# Patient Record
Sex: Female | Born: 1935 | Race: White | Hispanic: No | Smoking: Former smoker
Health system: Southern US, Community
[De-identification: ages and names within clinical notes are randomized; demographics above are authoritative.]

## PROBLEM LIST (undated history)

## (undated) DIAGNOSIS — I2699 Other pulmonary embolism without acute cor pulmonale: Secondary | ICD-10-CM

## (undated) DIAGNOSIS — K90829 Short bowel syndrome, unspecified: Secondary | ICD-10-CM

## (undated) DIAGNOSIS — M1990 Unspecified osteoarthritis, unspecified site: Secondary | ICD-10-CM

## (undated) DIAGNOSIS — K912 Postsurgical malabsorption, not elsewhere classified: Secondary | ICD-10-CM

## (undated) DIAGNOSIS — Z955 Presence of coronary angioplasty implant and graft: Secondary | ICD-10-CM

## (undated) DIAGNOSIS — S72009A Fracture of unspecified part of neck of unspecified femur, initial encounter for closed fracture: Secondary | ICD-10-CM

## (undated) DIAGNOSIS — C801 Malignant (primary) neoplasm, unspecified: Secondary | ICD-10-CM

## (undated) DIAGNOSIS — I469 Cardiac arrest, cause unspecified: Secondary | ICD-10-CM

## (undated) DIAGNOSIS — E876 Hypokalemia: Secondary | ICD-10-CM

## (undated) DIAGNOSIS — I82409 Acute embolism and thrombosis of unspecified deep veins of unspecified lower extremity: Secondary | ICD-10-CM

## (undated) HISTORY — DX: Hypokalemia: E87.6

## (undated) HISTORY — DX: Cardiac arrest, cause unspecified: I46.9

## (undated) HISTORY — DX: Acute embolism and thrombosis of unspecified deep veins of unspecified lower extremity: I82.409

## (undated) HISTORY — DX: Malignant (primary) neoplasm, unspecified: C80.1

## (undated) HISTORY — DX: Fracture of unspecified part of neck of unspecified femur, initial encounter for closed fracture: S72.009A

## (undated) HISTORY — DX: Presence of coronary angioplasty implant and graft: Z95.5

## (undated) HISTORY — DX: Unspecified osteoarthritis, unspecified site: M19.90

## (undated) HISTORY — DX: Other pulmonary embolism without acute cor pulmonale: I26.99

## (undated) HISTORY — DX: Short bowel syndrome, unspecified: K90.829

## (undated) HISTORY — DX: Postsurgical malabsorption, not elsewhere classified: K91.2

---

## 1997-04-03 HISTORY — PX: COLON SURGERY: SHX602

## 1997-04-03 HISTORY — PX: CATARACT EXTRACTION, BILATERAL: SHX1313

## 1997-04-03 HISTORY — PX: CHOLECYSTECTOMY: SHX55

## 2006-08-28 ENCOUNTER — Encounter: Payer: Self-pay | Admitting: Family Medicine

## 2007-05-09 ENCOUNTER — Ambulatory Visit: Payer: Self-pay | Admitting: Family Medicine

## 2007-05-09 DIAGNOSIS — M810 Age-related osteoporosis without current pathological fracture: Secondary | ICD-10-CM | POA: Insufficient documentation

## 2007-05-09 DIAGNOSIS — N183 Chronic kidney disease, stage 3 (moderate): Secondary | ICD-10-CM

## 2007-05-09 DIAGNOSIS — Z86718 Personal history of other venous thrombosis and embolism: Secondary | ICD-10-CM | POA: Insufficient documentation

## 2007-05-09 LAB — CONVERTED CEMR LAB: Prothrombin Time: 19.3 s

## 2007-05-13 ENCOUNTER — Encounter: Payer: Self-pay | Admitting: Family Medicine

## 2007-05-14 LAB — CONVERTED CEMR LAB
AST: 17 units/L (ref 0–37)
Albumin: 4.4 g/dL (ref 3.5–5.2)
Alkaline Phosphatase: 74 units/L (ref 39–117)
BUN: 24 mg/dL — ABNORMAL HIGH (ref 6–23)
Potassium: 4.6 meq/L (ref 3.5–5.3)
Sodium: 141 meq/L (ref 135–145)
Total Bilirubin: 0.5 mg/dL (ref 0.3–1.2)

## 2007-05-24 ENCOUNTER — Encounter: Payer: Self-pay | Admitting: Family Medicine

## 2007-05-24 DIAGNOSIS — E785 Hyperlipidemia, unspecified: Secondary | ICD-10-CM

## 2007-06-04 ENCOUNTER — Encounter: Payer: Self-pay | Admitting: Family Medicine

## 2007-06-05 ENCOUNTER — Encounter: Payer: Self-pay | Admitting: Family Medicine

## 2007-06-06 ENCOUNTER — Ambulatory Visit: Payer: Self-pay | Admitting: Family Medicine

## 2007-06-06 LAB — CONVERTED CEMR LAB: INR: 1.4

## 2007-06-13 ENCOUNTER — Ambulatory Visit: Payer: Self-pay | Admitting: Family Medicine

## 2007-06-13 LAB — CONVERTED CEMR LAB: Prothrombin Time: 16.4 s

## 2007-06-17 ENCOUNTER — Telehealth: Payer: Self-pay | Admitting: Family Medicine

## 2007-07-02 ENCOUNTER — Telehealth: Payer: Self-pay | Admitting: Family Medicine

## 2007-07-08 ENCOUNTER — Encounter: Payer: Self-pay | Admitting: Family Medicine

## 2007-07-11 ENCOUNTER — Encounter: Payer: Self-pay | Admitting: Family Medicine

## 2007-07-15 ENCOUNTER — Telehealth: Payer: Self-pay | Admitting: Family Medicine

## 2007-07-15 ENCOUNTER — Encounter: Payer: Self-pay | Admitting: Family Medicine

## 2007-07-15 LAB — CONVERTED CEMR LAB
INR: 1.5
Prothrombin Time: 14.7 s

## 2007-07-18 ENCOUNTER — Ambulatory Visit: Payer: Self-pay | Admitting: Family Medicine

## 2007-07-18 DIAGNOSIS — D510 Vitamin B12 deficiency anemia due to intrinsic factor deficiency: Secondary | ICD-10-CM

## 2007-07-18 DIAGNOSIS — S72009A Fracture of unspecified part of neck of unspecified femur, initial encounter for closed fracture: Secondary | ICD-10-CM | POA: Insufficient documentation

## 2007-07-18 DIAGNOSIS — D509 Iron deficiency anemia, unspecified: Secondary | ICD-10-CM

## 2007-07-18 LAB — CONVERTED CEMR LAB: Prothrombin Time: 13.1 s

## 2007-07-23 ENCOUNTER — Telehealth: Payer: Self-pay | Admitting: Family Medicine

## 2007-07-26 ENCOUNTER — Telehealth (INDEPENDENT_AMBULATORY_CARE_PROVIDER_SITE_OTHER): Payer: Self-pay | Admitting: Family Medicine

## 2007-07-26 ENCOUNTER — Encounter: Payer: Self-pay | Admitting: Family Medicine

## 2007-08-01 ENCOUNTER — Encounter: Payer: Self-pay | Admitting: Family Medicine

## 2007-08-01 ENCOUNTER — Telehealth: Payer: Self-pay | Admitting: Family Medicine

## 2007-08-01 LAB — CONVERTED CEMR LAB: Prothrombin Time: 17.8 s

## 2007-08-08 ENCOUNTER — Encounter: Payer: Self-pay | Admitting: Family Medicine

## 2007-08-09 ENCOUNTER — Encounter: Payer: Self-pay | Admitting: Family Medicine

## 2007-08-09 ENCOUNTER — Telehealth: Payer: Self-pay | Admitting: Family Medicine

## 2007-08-09 LAB — CONVERTED CEMR LAB: Prothrombin Time: 11.9 s

## 2007-08-22 ENCOUNTER — Telehealth: Payer: Self-pay | Admitting: Family Medicine

## 2007-08-22 ENCOUNTER — Encounter: Payer: Self-pay | Admitting: Family Medicine

## 2007-08-22 LAB — CONVERTED CEMR LAB
INR: 1.5
Prothrombin Time: 15.2 s

## 2007-09-06 ENCOUNTER — Telehealth (INDEPENDENT_AMBULATORY_CARE_PROVIDER_SITE_OTHER): Payer: Self-pay | Admitting: *Deleted

## 2007-09-06 ENCOUNTER — Encounter: Payer: Self-pay | Admitting: Family Medicine

## 2007-09-06 ENCOUNTER — Encounter: Payer: Self-pay | Admitting: *Deleted

## 2007-09-06 LAB — CONVERTED CEMR LAB
INR: 1.9
Prothrombin Time: 19.2 s

## 2007-09-09 ENCOUNTER — Encounter: Payer: Self-pay | Admitting: Family Medicine

## 2007-09-10 ENCOUNTER — Encounter: Payer: Self-pay | Admitting: Family Medicine

## 2007-09-11 ENCOUNTER — Encounter: Payer: Self-pay | Admitting: Family Medicine

## 2007-09-11 DIAGNOSIS — H269 Unspecified cataract: Secondary | ICD-10-CM

## 2007-09-13 ENCOUNTER — Encounter: Payer: Self-pay | Admitting: Family Medicine

## 2007-09-13 ENCOUNTER — Telehealth: Payer: Self-pay | Admitting: Family Medicine

## 2007-09-13 LAB — CONVERTED CEMR LAB: INR: 1.6

## 2007-09-23 ENCOUNTER — Telehealth: Payer: Self-pay | Admitting: Family Medicine

## 2007-09-23 ENCOUNTER — Encounter: Payer: Self-pay | Admitting: Family Medicine

## 2007-09-23 LAB — CONVERTED CEMR LAB: Prothrombin Time: 20.8 s

## 2007-09-27 ENCOUNTER — Telehealth: Payer: Self-pay | Admitting: Family Medicine

## 2007-09-27 ENCOUNTER — Encounter: Payer: Self-pay | Admitting: Family Medicine

## 2007-10-02 ENCOUNTER — Telehealth: Payer: Self-pay | Admitting: Family Medicine

## 2007-10-02 ENCOUNTER — Encounter: Payer: Self-pay | Admitting: Family Medicine

## 2007-10-02 LAB — CONVERTED CEMR LAB
INR: 2.1
Prothrombin Time: 20.8 s

## 2007-10-16 ENCOUNTER — Encounter: Payer: Self-pay | Admitting: Family Medicine

## 2007-11-05 ENCOUNTER — Encounter: Payer: Self-pay | Admitting: Family Medicine

## 2007-11-06 ENCOUNTER — Encounter: Payer: Self-pay | Admitting: Family Medicine

## 2007-11-06 ENCOUNTER — Ambulatory Visit: Payer: Self-pay | Admitting: Family Medicine

## 2007-11-06 ENCOUNTER — Other Ambulatory Visit: Admission: RE | Admit: 2007-11-06 | Discharge: 2007-11-06 | Payer: Self-pay | Admitting: Family Medicine

## 2007-11-06 DIAGNOSIS — R3129 Other microscopic hematuria: Secondary | ICD-10-CM

## 2007-11-06 DIAGNOSIS — Z85038 Personal history of other malignant neoplasm of large intestine: Secondary | ICD-10-CM | POA: Insufficient documentation

## 2007-11-06 LAB — CONVERTED CEMR LAB
INR: 2.3
Prothrombin Time: 18.6 s

## 2007-11-07 ENCOUNTER — Encounter: Payer: Self-pay | Admitting: Family Medicine

## 2007-11-07 LAB — CONVERTED CEMR LAB
ALT: 20 units/L (ref 0–35)
AST: 19 units/L (ref 0–37)
Albumin: 4.4 g/dL (ref 3.5–5.2)
Alkaline Phosphatase: 88 units/L (ref 39–117)
Glucose, Bld: 94 mg/dL (ref 70–99)
LDL Cholesterol: 75 mg/dL (ref 0–99)
Potassium: 3.8 meq/L (ref 3.5–5.3)
Sodium: 142 meq/L (ref 135–145)
Total Bilirubin: 0.6 mg/dL (ref 0.3–1.2)
Total Protein: 7.1 g/dL (ref 6.0–8.3)
Triglycerides: 236 mg/dL — ABNORMAL HIGH (ref <150)
VLDL: 47 mg/dL — ABNORMAL HIGH (ref 0–40)

## 2007-11-11 LAB — CONVERTED CEMR LAB: Pap Smear: NORMAL

## 2007-11-15 ENCOUNTER — Encounter: Payer: Self-pay | Admitting: Family Medicine

## 2007-11-16 ENCOUNTER — Encounter: Payer: Self-pay | Admitting: Family Medicine

## 2007-11-18 LAB — CONVERTED CEMR LAB
Microalb Creat Ratio: 9.7 mg/g (ref 0.0–30.0)
Microalb, Ur: 2.34 mg/dL — ABNORMAL HIGH (ref 0.00–1.89)
PTH: 44 pg/mL (ref 14.0–72.0)

## 2007-11-27 ENCOUNTER — Encounter: Payer: Self-pay | Admitting: Family Medicine

## 2007-12-03 ENCOUNTER — Encounter: Payer: Self-pay | Admitting: Family Medicine

## 2007-12-04 ENCOUNTER — Ambulatory Visit: Payer: Self-pay | Admitting: Family Medicine

## 2007-12-04 LAB — CONVERTED CEMR LAB: Prothrombin Time: 18.4 s

## 2007-12-05 ENCOUNTER — Telehealth (INDEPENDENT_AMBULATORY_CARE_PROVIDER_SITE_OTHER): Payer: Self-pay | Admitting: *Deleted

## 2007-12-06 ENCOUNTER — Encounter: Payer: Self-pay | Admitting: Family Medicine

## 2007-12-26 ENCOUNTER — Ambulatory Visit: Payer: Self-pay | Admitting: Family Medicine

## 2007-12-26 LAB — CONVERTED CEMR LAB: Prothrombin Time: 20.8 s

## 2008-01-13 ENCOUNTER — Telehealth: Payer: Self-pay | Admitting: Family Medicine

## 2008-02-03 ENCOUNTER — Ambulatory Visit: Payer: Self-pay | Admitting: Family Medicine

## 2008-02-03 DIAGNOSIS — M25519 Pain in unspecified shoulder: Secondary | ICD-10-CM

## 2008-02-03 DIAGNOSIS — M549 Dorsalgia, unspecified: Secondary | ICD-10-CM | POA: Insufficient documentation

## 2008-02-03 LAB — CONVERTED CEMR LAB
INR: 3
Prothrombin Time: 21 s

## 2008-02-12 LAB — HM COLONOSCOPY: HM Colonoscopy: NORMAL

## 2008-02-25 ENCOUNTER — Encounter: Payer: Self-pay | Admitting: Family Medicine

## 2008-03-04 ENCOUNTER — Ambulatory Visit: Payer: Self-pay | Admitting: Family Medicine

## 2008-03-04 LAB — CONVERTED CEMR LAB: INR: 2.5

## 2008-03-31 ENCOUNTER — Ambulatory Visit: Payer: Self-pay | Admitting: Family Medicine

## 2008-04-28 ENCOUNTER — Ambulatory Visit: Payer: Self-pay | Admitting: Family Medicine

## 2008-04-28 LAB — CONVERTED CEMR LAB: INR: 3

## 2008-05-27 ENCOUNTER — Encounter: Payer: Self-pay | Admitting: Family Medicine

## 2008-06-01 ENCOUNTER — Telehealth: Payer: Self-pay | Admitting: Family Medicine

## 2008-06-12 ENCOUNTER — Ambulatory Visit: Payer: Self-pay | Admitting: Family Medicine

## 2008-06-12 LAB — CONVERTED CEMR LAB: Prothrombin Time: 17.5 s

## 2008-07-06 ENCOUNTER — Ambulatory Visit: Payer: Self-pay | Admitting: Family Medicine

## 2008-07-06 DIAGNOSIS — R609 Edema, unspecified: Secondary | ICD-10-CM

## 2008-07-06 DIAGNOSIS — E559 Vitamin D deficiency, unspecified: Secondary | ICD-10-CM | POA: Insufficient documentation

## 2008-07-06 LAB — CONVERTED CEMR LAB
INR: 3
Prothrombin Time: 20.9 s

## 2008-07-20 ENCOUNTER — Ambulatory Visit: Payer: Self-pay | Admitting: Family Medicine

## 2008-07-20 LAB — CONVERTED CEMR LAB: INR: 3.2

## 2008-08-03 ENCOUNTER — Ambulatory Visit: Payer: Self-pay | Admitting: Family Medicine

## 2008-09-23 ENCOUNTER — Telehealth (INDEPENDENT_AMBULATORY_CARE_PROVIDER_SITE_OTHER): Payer: Self-pay | Admitting: *Deleted

## 2008-09-23 ENCOUNTER — Ambulatory Visit: Payer: Self-pay | Admitting: Family Medicine

## 2008-09-23 LAB — CONVERTED CEMR LAB
INR: 5.3 (ref 0.0–1.5)
Prothrombin Time: 33.6 s — ABNORMAL HIGH (ref 10.2–14.0)

## 2008-09-28 ENCOUNTER — Ambulatory Visit: Payer: Self-pay | Admitting: Family Medicine

## 2008-09-28 LAB — CONVERTED CEMR LAB: INR: 3.2

## 2008-10-08 ENCOUNTER — Ambulatory Visit: Payer: Self-pay | Admitting: Family Medicine

## 2008-10-08 LAB — CONVERTED CEMR LAB
INR: 3.1
Prothrombin Time: 21.2 s

## 2008-10-22 ENCOUNTER — Ambulatory Visit: Payer: Self-pay | Admitting: Family Medicine

## 2008-10-22 ENCOUNTER — Telehealth (INDEPENDENT_AMBULATORY_CARE_PROVIDER_SITE_OTHER): Payer: Self-pay | Admitting: *Deleted

## 2008-10-29 ENCOUNTER — Ambulatory Visit: Payer: Self-pay | Admitting: Family Medicine

## 2008-10-29 ENCOUNTER — Encounter: Admission: RE | Admit: 2008-10-29 | Discharge: 2008-10-29 | Payer: Self-pay | Admitting: Family Medicine

## 2008-10-29 DIAGNOSIS — M79609 Pain in unspecified limb: Secondary | ICD-10-CM

## 2008-11-02 ENCOUNTER — Encounter: Payer: Self-pay | Admitting: Family Medicine

## 2008-11-02 LAB — CONVERTED CEMR LAB
ALT: 12 units/L (ref 0–35)
BUN: 23 mg/dL (ref 6–23)
Basophils Relative: 0 % (ref 0–1)
CO2: 24 meq/L (ref 19–32)
Calcium: 8 mg/dL — ABNORMAL LOW (ref 8.4–10.5)
Chloride: 105 meq/L (ref 96–112)
Creatinine, Ser: 1.32 mg/dL — ABNORMAL HIGH (ref 0.40–1.20)
Eosinophils Absolute: 0.1 10*3/uL (ref 0.0–0.7)
Eosinophils Relative: 2 % (ref 0–5)
Glucose, Bld: 97 mg/dL (ref 70–99)
HCT: 33.4 % — ABNORMAL LOW (ref 36.0–46.0)
Hemoglobin: 10.6 g/dL — ABNORMAL LOW (ref 12.0–15.0)
Iron: 61 ug/dL (ref 42–145)
MCHC: 31.7 g/dL (ref 30.0–36.0)
MCV: 89.5 fL (ref 78.0–100.0)
Monocytes Absolute: 0.4 10*3/uL (ref 0.1–1.0)
Monocytes Relative: 7 % (ref 3–12)
RBC: 3.73 M/uL — ABNORMAL LOW (ref 3.87–5.11)
Sed Rate: 28 mm/hr — ABNORMAL HIGH (ref 0–22)
TSH: 0.638 microintl units/mL (ref 0.350–4.500)

## 2008-11-12 ENCOUNTER — Ambulatory Visit: Payer: Self-pay | Admitting: Family Medicine

## 2008-12-03 ENCOUNTER — Ambulatory Visit: Payer: Self-pay | Admitting: Family Medicine

## 2008-12-03 LAB — CONVERTED CEMR LAB: INR: 2.1

## 2008-12-29 ENCOUNTER — Ambulatory Visit: Payer: Self-pay | Admitting: Family Medicine

## 2008-12-29 ENCOUNTER — Encounter: Admission: RE | Admit: 2008-12-29 | Discharge: 2008-12-29 | Payer: Self-pay | Admitting: Family Medicine

## 2008-12-29 DIAGNOSIS — M25579 Pain in unspecified ankle and joints of unspecified foot: Secondary | ICD-10-CM

## 2008-12-29 LAB — CONVERTED CEMR LAB: Prothrombin Time: 12.1 s

## 2008-12-30 ENCOUNTER — Encounter: Payer: Self-pay | Admitting: Family Medicine

## 2008-12-30 LAB — CONVERTED CEMR LAB
INR: 1.5 (ref 0.0–1.5)
PTH: 70.4 pg/mL (ref 14.0–72.0)
Prothrombin Time: 17.5 s — ABNORMAL HIGH (ref 11.6–15.2)

## 2009-02-04 ENCOUNTER — Ambulatory Visit: Payer: Self-pay | Admitting: Family Medicine

## 2009-03-03 ENCOUNTER — Ambulatory Visit: Payer: Self-pay | Admitting: Family Medicine

## 2009-03-03 LAB — CONVERTED CEMR LAB
INR: 4
Prothrombin Time: 24.2 s

## 2009-03-17 ENCOUNTER — Ambulatory Visit: Payer: Self-pay | Admitting: Family Medicine

## 2009-03-17 LAB — CONVERTED CEMR LAB: Prothrombin Time: 16.4 s

## 2009-03-31 ENCOUNTER — Ambulatory Visit: Payer: Self-pay | Admitting: Family Medicine

## 2009-03-31 LAB — CONVERTED CEMR LAB: Prothrombin Time: 17.8 s

## 2009-04-01 ENCOUNTER — Encounter: Payer: Self-pay | Admitting: Family Medicine

## 2009-04-22 ENCOUNTER — Ambulatory Visit: Payer: Self-pay | Admitting: Family Medicine

## 2009-04-23 LAB — CONVERTED CEMR LAB: Uric Acid, Serum: 4.9 mg/dL (ref 2.4–7.0)

## 2009-04-27 ENCOUNTER — Telehealth: Payer: Self-pay | Admitting: Family Medicine

## 2009-04-27 ENCOUNTER — Encounter: Payer: Self-pay | Admitting: Family Medicine

## 2009-06-16 ENCOUNTER — Ambulatory Visit: Payer: Self-pay | Admitting: Family Medicine

## 2009-06-16 DIAGNOSIS — R197 Diarrhea, unspecified: Secondary | ICD-10-CM

## 2009-06-16 LAB — CONVERTED CEMR LAB
INR: 2.5
Prothrombin Time: 19.3 s

## 2009-06-17 ENCOUNTER — Telehealth: Payer: Self-pay | Admitting: Family Medicine

## 2009-06-21 LAB — CONVERTED CEMR LAB
BUN: 20 mg/dL (ref 6–23)
Creatinine, Ser: 1.63 mg/dL — ABNORMAL HIGH (ref 0.40–1.20)

## 2009-06-22 ENCOUNTER — Encounter: Payer: Self-pay | Admitting: Family Medicine

## 2009-06-23 ENCOUNTER — Encounter: Payer: Self-pay | Admitting: Family Medicine

## 2009-06-23 LAB — CONVERTED CEMR LAB
Lipase: 78 units/L — ABNORMAL HIGH (ref 0–75)
Magnesium: 0.5 mg/dL — CL (ref 1.5–2.5)
PTH: 35.2 pg/mL (ref 14.0–72.0)

## 2009-07-14 ENCOUNTER — Ambulatory Visit: Payer: Self-pay | Admitting: Family Medicine

## 2009-07-27 ENCOUNTER — Telehealth: Payer: Self-pay | Admitting: Family Medicine

## 2009-07-27 DIAGNOSIS — E876 Hypokalemia: Secondary | ICD-10-CM

## 2009-07-28 ENCOUNTER — Ambulatory Visit: Payer: Self-pay | Admitting: Family Medicine

## 2009-07-28 LAB — CONVERTED CEMR LAB
INR: 1.1
Prothrombin Time: 12.7 s

## 2009-07-29 LAB — CONVERTED CEMR LAB
Calcium Ionized: 1.11 mmol/L — ABNORMAL LOW (ref 1.12–1.32)
Magnesium: 0.6 mg/dL — ABNORMAL LOW (ref 1.5–2.5)
Potassium: 3.4 meq/L — ABNORMAL LOW (ref 3.5–5.3)

## 2009-08-09 ENCOUNTER — Ambulatory Visit: Payer: Self-pay | Admitting: Family Medicine

## 2009-08-31 ENCOUNTER — Encounter: Payer: Self-pay | Admitting: Family Medicine

## 2009-09-06 ENCOUNTER — Ambulatory Visit: Payer: Self-pay | Admitting: Family Medicine

## 2009-09-28 ENCOUNTER — Encounter: Payer: Self-pay | Admitting: Family Medicine

## 2009-10-11 ENCOUNTER — Ambulatory Visit: Payer: Self-pay | Admitting: Family Medicine

## 2009-11-11 ENCOUNTER — Ambulatory Visit: Payer: Self-pay | Admitting: Family Medicine

## 2009-12-21 ENCOUNTER — Encounter: Payer: Self-pay | Admitting: Family Medicine

## 2009-12-23 ENCOUNTER — Ambulatory Visit: Payer: Self-pay | Admitting: Family Medicine

## 2009-12-31 ENCOUNTER — Ambulatory Visit: Payer: Self-pay | Admitting: Family Medicine

## 2009-12-31 LAB — CONVERTED CEMR LAB: INR: 2.7

## 2010-02-01 ENCOUNTER — Encounter: Payer: Self-pay | Admitting: Family Medicine

## 2010-02-04 ENCOUNTER — Ambulatory Visit: Payer: Self-pay | Admitting: Family Medicine

## 2010-02-04 LAB — CONVERTED CEMR LAB: INR: 2.5

## 2010-03-01 ENCOUNTER — Encounter: Payer: Self-pay | Admitting: Family Medicine

## 2010-03-04 ENCOUNTER — Ambulatory Visit: Payer: Self-pay | Admitting: Family Medicine

## 2010-03-04 LAB — CONVERTED CEMR LAB: INR: 2.8

## 2010-03-29 ENCOUNTER — Encounter: Payer: Self-pay | Admitting: Family Medicine

## 2010-04-15 ENCOUNTER — Ambulatory Visit
Admission: RE | Admit: 2010-04-15 | Discharge: 2010-04-15 | Payer: Self-pay | Source: Home / Self Care | Attending: Family Medicine | Admitting: Family Medicine

## 2010-04-22 ENCOUNTER — Ambulatory Visit
Admission: RE | Admit: 2010-04-22 | Discharge: 2010-04-22 | Payer: Self-pay | Source: Home / Self Care | Attending: Family Medicine | Admitting: Family Medicine

## 2010-04-25 ENCOUNTER — Telehealth: Payer: Self-pay | Admitting: Family Medicine

## 2010-05-03 NOTE — Consult Note (Signed)
Summary: [facility]   Imported By: Lanelle Bal 05/01/2009 11:39:12  _____________________________________________________________________  External Attachment:    Type:   Image     Comment:   External Document

## 2010-05-03 NOTE — Miscellaneous (Signed)
  Clinical Lists Changes  Medications: Changed medication from KLOR-CON 10 10 MEQ  TBCR (POTASSIUM CHLORIDE) Take one tablet by mouth once ad ay to KLOR-CON 10 10 MEQ  TBCR (POTASSIUM CHLORIDE) Take one tablet by mouth twice a day - Signed Rx of KLOR-CON 10 10 MEQ  TBCR (POTASSIUM CHLORIDE) Take one tablet by mouth twice a day;  #180 x 2;  Signed;  Entered by: Kathlene November;  Authorized by: Nani Gasser MD;  Method used: Electronically to [facility] [address], [place], [place]  [address], Ph: [phone], Fax: [phone]    Prescriptions: KLOR-CON 10 10 MEQ  TBCR (POTASSIUM CHLORIDE) Take one tablet by mouth twice a day  #180 x 2   Entered by:   Kathlene November   Authorized by:   Nani Gasser MD   Signed by:   Kathlene November on 07/28/2009   Method used:   Electronically to        [facility] (retail)       [address]       [place], [place]  [address]       Ph: [phone]       Fax: [phone]   RxID:   [id number]

## 2010-05-03 NOTE — Progress Notes (Signed)
Summary: Over due for labs     New Problems: HYPOKALEMIA (ICD-276.8) HYPOMAGNESEMIA (ICD-275.2)   New Problems: HYPOKALEMIA (ICD-276.8) HYPOMAGNESEMIA (ICD-275.2) Pt vcalled. Overdue for labs. Pt says she will go tomorrow. April 26, 20112:51 PM Gabrielle Juul MD, Santina Evans

## 2010-05-03 NOTE — Assessment & Plan Note (Signed)
Summary: Coumadin check   Nurse Visit   Anticoagulation Management History:      The patient is on coumadin and comes in today for a routine follow up visit.  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  Plans are to keep the patient on coumadin for life.  Her last INR was 2.5 and today's INR is 2.8.     Allergies: 1)  ! Bactrim 2)  ! Cipro 3)  ! Zithromax 4)  ! Macrobid 5)  ! Fosamax 6)  Tramadol Hcl (Tramadol Hcl) Laboratory Results   Blood Tests   Date/Time Received: 03/04/2010 Date/Time Reported: 03/04/2010   INR: 2.8   (Normal Range: 0.88-1.12   Therap INR: 2.0-3.5)    Orders Added: 1)  Fingerstick [36416] 2)  Protime [16109UE]   Anticoagulation Management Assessment/Plan:      The target INR is 2.0-3.0.  She is to have a PT/INR in 6 weeks.  Anticoagulation instructions were given to patient.         Current Anticoagulation Instructions: The patient is to continue with the same dose of coumadin.  This dosage includes: Coumadin 4 mg tabs:  Sunday - 4 mg, Monday - 4 mg, Tuesday - 4 mg, Wednesday - 4 mg, Thursday - 4 mg, Friday - 4 mg, Saturday - 4 mg.  Repeat PT/INR in 6 weeks.

## 2010-05-03 NOTE — Assessment & Plan Note (Signed)
Summary: NURSE INR  Nurse Visit   Allergies: 1)  ! Bactrim 2)  ! Cipro 3)  ! Zithromax 4)  ! Macrobid 5)  ! Fosamax 6)  Tramadol Hcl (Tramadol Hcl) Laboratory Results   Blood Tests   Date/Time Received: 08/09/2009 Date/Time Reported: 08/09/2009  PT: 19.2 s   (Normal Range: 10.6-13.4)  INR: 2.5   (Normal Range: 0.88-1.12   Therap INR: 2.0-3.5)    Orders Added: 1)  Fingerstick [36416] 2)  Protime INR [[id number]]   Anticoagulation Management History:      The patient is on coumadin and comes in today for a routine follow up visit.  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  Plans are to keep the patient on coumadin for life.  Her last INR was 1.1 and today's INR is 2.5.    Anticoagulation Management Assessment/Plan:      The target INR is 2.0-3.0.  She is to have a PT/INR in 2 weeks.  Anticoagulation instructions were given to patient.         Current Anticoagulation Instructions: The patient is to continue with the same dose of coumadin.  This dosage includes: Coumadin 2 mg tabs:  Sunday - 4 mg, Monday - 4 mg, Tuesday - 4 mg, Wednesday - 4 mg, Thursday - 4 mg, Friday - 4 mg, Saturday - 4 mg.  Repeat PT/INR in 2 weeks.

## 2010-05-03 NOTE — Assessment & Plan Note (Signed)
Summary: INR check - jr  Nurse Visit   Anticoagulation Management History:      The patient is on coumadin and comes in today for a routine follow up visit.  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  Plans are to keep the patient on coumadin for life.  Her last INR was 2.9 and today's INR is 2.2.     Allergies: 1)  ! Bactrim 2)  ! Cipro 3)  ! Zithromax 4)  ! Macrobid 5)  ! Fosamax 6)  Tramadol Hcl (Tramadol Hcl) Laboratory Results   Blood Tests   Date/Time Received: 10/11/2009 Date/Time Reported: 10/11/2009   INR: 2.2   (Normal Range: 0.88-1.12   Therap INR: 2.0-3.5)    Orders Added: 1)  Fingerstick [36416] 2)  Protime INR [[id number]]   Anticoagulation Management Assessment/Plan:      The target INR is 2.0-3.0.  She is to have a PT/INR in 4 weeks.  Anticoagulation instructions were given to patient.         Current Anticoagulation Instructions: The patient is to continue with the same dose of coumadin.  This dosage includes:   Coumadin 4 mg tabs:  Sunday - 4 mg, Monday - 4 mg, Tuesday - 4 mg, Wednesday - 4 mg, Thursday - 4 mg, Friday - 4 mg, Saturday - 4 mg.    Repeat PT/INR in 4 weeks.

## 2010-05-03 NOTE — Assessment & Plan Note (Signed)
Summary: P.T. Check  & Flu shot  Nurse Visit   Vitals Entered By: Payton Spark CMA (December 23, 2009 2:11 PM)  Allergies: 1)  ! Bactrim 2)  ! Cipro 3)  ! Zithromax 4)  ! Macrobid 5)  ! Fosamax 6)  Tramadol Hcl (Tramadol Hcl) Laboratory Results   Blood Tests      INR: 3.0   (Normal Range: 0.88-1.12   Therap INR: 2.0-3.5)    Orders Added: 1)  Fingerstick [36416] 2)  Protime [16109UE] 3)  Flu Vaccine 26yrs + MEDICARE PATIENTS [Q2039] 4)  Administration Flu vaccine - MCR [G0008] Flu Vaccine Consent Questions     Do you have a history of severe allergic reactions to this vaccine? no    Any prior history of allergic reactions to egg and/or gelatin? no    Do you have a sensitivity to the preservative Thimersol? no    Do you have a past history of Guillan-Barre Syndrome? no    Do you currently have an acute febrile illness? no    Have you ever had a severe reaction to latex? no    Vaccine information given and explained to patient? yes    Are you currently pregnant? 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  Plans are to keep the patient on coumadin for life.  Her last INR was 2.1 and today's INR is 3.0.    Anticoagulation Management Assessment/Plan:      The target INR is 2.0-3.0.  She is to have a PT/INR in 2 weeks.  Anticoagulation instructions were given to patient.         Current Anticoagulation Instructions: The patient is to continue with the same dose of coumadin.  This dosage includes: Coumadin 4 mg tabs:  Sunday - 4 mg, Monday - 4 mg, Tuesday - 4 mg, Wednesday - 4 mg, Thursday - 4 mg, Friday - 4 mg, Saturday - 4 mg.  Repeat PT/INR in 2 weeks.       Appended Document: P.T. Check  & Flu shot

## 2010-05-03 NOTE — Progress Notes (Signed)
Summary: Negative Doppler  Phone Note From Other Clinic   Caller: [facility] Call For: [facility] Summary of Call: Pt is negative for DVT Initial call taken by: Kathlene November,  April 27, 2009 1:50 PM  Follow-up for Phone Call        Call pt: No blood clots.  thus swelling is from venous stasis.Floppy valves in teh veins. The treatment is elevation when sitting and the compression stockings.  Can also use her fluids pill mroe regularly if helps and if doesn' dry her out too much. If uses fluid pill daily then willl need to check potassium in one week.  Follow-up by: Nani Gasser MD,  April 27, 2009 2:18 PM  Additional Follow-up for Phone Call Additional follow up Details #1::        Pt notifeidf of results and MD instructions.  Additional Follow-up by: Kathlene November,  April 28, 2009 12:26 PM

## 2010-05-03 NOTE — Assessment & Plan Note (Signed)
Summary: coumadin check - jr  Nurse Visit   Allergies: 1)  ! Bactrim 2)  ! Cipro 3)  ! Zithromax 4)  ! Macrobid 5)  ! Fosamax 6)  Tramadol Hcl (Tramadol Hcl) Laboratory Results   Blood Tests   Date/Time Received: 02/04/2010 Date/Time Reported: 02/04/2010   INR: 2.5   (Normal Range: 0.88-1.12   Therap INR: 2.0-3.5)    Orders Added: 1)  Fingerstick [36416] 2)  Protime [16109UE]   Anticoagulation Management History:      The patient is on coumadin and comes in today for a routine follow up visit.  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  Plans are to keep the patient on coumadin for life.  Her last INR was 2.7 and today's INR is 2.5.    Anticoagulation Management Assessment/Plan:      The target INR is 2.0-3.0.  She is to have a PT/INR in 4 weeks.  Anticoagulation instructions were given to patient.         Current Anticoagulation Instructions: Same as Prior Instructions.   Appended Document: coumadin check - jr   Immunizations Administered:  Zostavax # 1:    Vaccine Type: Zostavax    Site: right deltoid    Mfr: Merck    Dose: 0.41ml    Route: IM    Given by: Kathlene November LPN    Exp. Date: 11/19/2010    Lot #: [id number]    VIS given: 01/13/05 given February 04, 2010.

## 2010-05-03 NOTE — Assessment & Plan Note (Signed)
Summary: Leg pain, diarrhea, coumadin   Vital Signs:  Patient profile:   77 year old female Height:      63 inches Weight:      103 pounds Pulse rate:   92 / minute BP sitting:   80 / 56  (left arm) Cuff size:   regular  Vitals Entered By: Kathlene November (June 16, 2009 10:36 AM) CC: having alot of leg pain   Primary Care Provider:  Linford Arnold, C  CC:  having alot of leg pain.  History of Present Illness: Tramadol makes her nauseated so has stopped it.  Usig extra strength Tylenol. Just takes the edge off it.  Tried to volateran gel but not sure it is really helping. Has been very stressed with her husbands health and feels this is why she has lost weight. She stressed and this causing diarrhea. no fever or abdominal pain thought does get some cramping with the diarrhea. No blood in the stool.  Wears her compression stocking very regularly adn has seen podiatry back in the fall. Given inserts but they make her feet hurt work. Occ uses the volateran gel.  Sometimes hard to walk.   Anticoagulation Management History:      The patient is on coumadin and comes in today for a routine follow up visit.  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  Plans are to keep the patient on coumadin for life.  Her last INR was 2.1 and today's INR is 2.5.    Current Medications (verified): 1)  Furosemide 40 Mg  Tabs (Furosemide) .... Take 1 Tablet By Mouth Once A Day As Needed 2)  Klor-Con 10 10 Meq  Tbcr (Potassium Chloride) .... Take One Tablet By Mouth Once Ad Ay 3)  Calcium 600 600 Mg  Tabs (Calcium Carbonate) .... Take One Tablet By Mouth Once A Day 4)  Multivitamins   Tabs (Multiple Vitamin) .... Take One Tablet By Mouth Once Ad Ay 5)  Vitamin D 1000mg  .... One By Mouth Daily 6)  Voltaren 1 % Gel (Diclofenac Sodium) .... Apply To Affected Joint Up To 4 X A Day 7)  Coumadin 2 Mg  Tabs (Warfarin Sodium) .... Sunday - 4 Mg, Monday - 4 Mg, Tuesday - 2 Mg, Wednesday - 4 Mg, Thursday - 4 Mg, Friday - 4 Mg, Saturday - 2 Mg 8)  Coumadin 4 Mg Tabs (Warfarin Sodium) .... Sunday - 4 Mg, Monday - 4 Mg, Tuesday - 2 Mg, Wednesday - 4 Mg, Thursday - 4 Mg, Friday - 4 Mg, Saturday - 2 Mg  Allergies (verified): 1)  ! Bactrim 2)  ! Cipro 3)  ! Zithromax 4)  ! Macrobid 5)  ! Fosamax 6)  Tramadol Hcl (Tramadol Hcl)  Comments:  Nurse/Medical Assistant: The patient's medications and allergies were reviewed with the patient and were updated in the Medication and Allergy Lists. Kathlene November (June 16, 2009 10:37 AM)  Physical Exam  General:  Well-developed,well-nourished,in no acute distress; alert,appropriate and cooperative throughout examination Msk:  She is wearing her compression stockings today. Left ankle is more swolen than the right.    Impression & Recommendations:  Problem # 1:  LEG PAIN (ICD-729.5) Will refer to neurology for evaluationf or neuropathy. In meantime encouraged her to use the volteran gerl more consistantly to see if might help her pain.    Problem # 2:  DIARRHEA (ICD-787.91) Likley from stresss. She  feels it is her "spastic colon" flaring. Makes sure staying hydrated and will check Potassium.  Orders: T-Basic Metabolic Panel [id number] [id number])  Complete Medication List: 1)  Furosemide 40 Mg Tabs (Furosemide) .... Take 1 tablet by mouth once a day as needed 2)  Klor-con 10 10 Meq Tbcr (Potassium chloride) .... Take one tablet by mouth once ad ay 3)  Calcium 600 600 Mg Tabs (Calcium carbonate) .... Take one tablet by mouth once a day 4)  Multivitamins Tabs (Multiple vitamin) .... Take one tablet by mouth once ad ay 5)  Vitamin D 1000mg   .... One by mouth daily 6)  Voltaren 1 % Gel (Diclofenac sodium) .... Apply to affected joint up to 4 x a day 7)  Coumadin 2 Mg Tabs (Warfarin sodium) .... Sunday - 4 mg, monday - 4 mg, tuesday - 2 mg, wednesday - 4 mg,  thursday - 4 mg, friday - 4 mg, saturday - 2 mg 8)  Coumadin 4 Mg Tabs (Warfarin sodium) .... Sunday - 4 mg, monday - 4 mg, tuesday - 2 mg, wednesday - 4 mg, thursday - 4 mg, friday - 4 mg, saturday - 2 mg  Other Orders: Fingerstick ([id number]) Protime INR ([id number]) Neurology Referral (Neuro)  Anticoagulation Management Assessment/Plan:      The target INR is 2.0-3.0.  She is to have a PT/INR in 4 weeks.  Anticoagulation instructions were given to patient.         Current Anticoagulation Instructions: The patient is to continue with the same dose of coumadin.  This dosage includes: Coumadin 2 mg tabs and Coumadin 4 mg tabs:  Sunday - 4 mg, Monday - 4 mg, Tuesday - 2 mg, Wednesday - 4 mg, Thursday - 4 mg, Friday - 4 mg, Saturday - 2 mg.  Repeat PT/INR in 4 weeks.    Laboratory Results   Blood Tests   Date/Time Received: 06/16/2009 Date/Time Reported: 06/16/2009  PT: 19.3 s   (Normal Range: 10.6-13.4)  INR: 2.5   (Normal Range: 0.88-1.12   Therap INR: 2.0-3.5)

## 2010-05-03 NOTE — Assessment & Plan Note (Signed)
Summary: Coumadin check  jr/mpm  Nurse Visit   Vitals Entered By: Payton Spark CMA (December 31, 2009 3:22 PM)  Allergies: 1)  ! Bactrim 2)  ! Cipro 3)  ! Zithromax 4)  ! Macrobid 5)  ! Fosamax 6)  Tramadol Hcl (Tramadol Hcl) Laboratory Results   Blood Tests      INR: 2.7   (Normal Range: 0.88-1.12   Therap INR: 2.0-3.5)    Orders Added: 1)  Fingerstick [36416] 2)  Protime [[id number]]   Anticoagulation Management History:      The patient is on coumadin and comes in today for a routine follow up visit.  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  Plans are to keep the patient on coumadin for life.  Her last INR was 3.0 and today's INR is 2.7.    Anticoagulation Management Assessment/Plan:      The target INR is 2.0-3.0.  She is to have a PT/INR in 4 weeks.  Anticoagulation instructions were given to patient.         Current Anticoagulation Instructions: The patient is to continue with the same dose of coumadin.  This dosage includes:   Coumadin 4 mg tabs:  Sunday - 4 mg, Monday - 4 mg, Tuesday - 4 mg, Wednesday - 4 mg, Thursday - 4 mg, Friday - 4 mg, Saturday - 4 mg.    Repeat PT/INR in 4 weeks.

## 2010-05-03 NOTE — Letter (Signed)
Summary: [facility]   Imported By: Lanelle Bal 01/03/2010 09:51:24  _____________________________________________________________________  External Attachment:    Type:   Image     Comment:   External Document

## 2010-05-03 NOTE — Assessment & Plan Note (Signed)
Summary: Coumadin Check - jr  Nurse Visit   Current Medications (verified): 1)  Furosemide 40 Mg  Tabs (Furosemide) .... Take 1 Tablet By Mouth Once A Day As Needed 2)  Klor-Con 10 10 Meq  Tbcr (Potassium Chloride) .... Take One Tablet By Mouth Twice A Day 3)  Calcium 600 600 Mg  Tabs (Calcium Carbonate) .... Take One Tablet By Mouth Once A Day 4)  Multivitamins   Tabs (Multiple Vitamin) .... Take One Tablet By Mouth Once Ad Ay 5)  Vitamin D 1000mg  .... One By Mouth Daily 6)  Voltaren 1 % Gel (Diclofenac Sodium) .... Apply To Affected Joint Up To 4 X A Day 7)  Magnesium Oxide 500 Mg Tabs (Magnesium Oxide) .Marland Kitchen.. 1 Tab By Mouth Two Times A Day 8)  Coumadin 4 Mg Tabs (Warfarin Sodium) .... Sunday - 4 Mg, Monday - 4 Mg, Tuesday - 4 Mg, Wednesday - 4 Mg, Thursday - 4 Mg, Friday - 4 Mg, Saturday - 4 Mg 9)  Amitriptyline Hcl 50 Mg Tabs (Amitriptyline Hcl) .... Take One Tablet By Mouth Once A Day 10)  Ferrous Sulfate 325 (65 Fe) Mg Tabs (Ferrous Sulfate) .... Take One Tablet By Mouth Once A  Day  Allergies (verified): 1)  ! Bactrim 2)  ! Cipro 3)  ! Zithromax 4)  ! Macrobid 5)  ! Fosamax 6)  Tramadol Hcl (Tramadol Hcl) Laboratory Results   Blood Tests   Date/Time Received: 11/11/2009 Date/Time Reported: 11/11/2009   INR: 2.1   (Normal Range: 0.88-1.12   Therap INR: 2.0-3.5)    Orders Added: 1)  Fingerstick [36416] 2)  Protime [[id number]]   Anticoagulation Management History:      The patient is on coumadin and comes in today for a routine follow up visit.  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  Plans are to keep the patient on coumadin for life.  Her last INR was 2.2 and today's INR is 2.1.    Anticoagulation Management Assessment/Plan:      The target INR is 2.0-3.0.  She is to have a PT/INR in 6 weeks.  Anticoagulation instructions were given to patient.           Current Anticoagulation Instructions: The patient is to continue with the same dose of coumadin.  This dosage includes: Repeat PT/INR in 6 weeks.  Coumadin 4 mg tabs:  Sunday - 4 mg, Monday - 4 mg, Tuesday - 4 mg, Wednesday - 4 mg, Thursday - 4 mg, Friday - 4 mg, Saturday - 4 mg.

## 2010-05-03 NOTE — Assessment & Plan Note (Signed)
Summary: PT/INR  Nurse Visit   Allergies: 1)  ! Bactrim 2)  ! Cipro 3)  ! Zithromax 4)  ! Macrobid 5)  ! Fosamax 6)  Tramadol Hcl (Tramadol Hcl) Laboratory Results   Blood Tests   Date/Time Received: 09/06/2009 Date/Time Reported: 09/06/2009   INR: 2.9   (Normal Range: 0.88-1.12   Therap INR: 2.0-3.5)    Orders Added: 1)  Fingerstick [36416] 2)  Protime INR [[id number]]   Anticoagulation Management History:      The patient is on coumadin and comes in today for a routine follow up visit.  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  Plans are to keep the patient on coumadin for life.  Her last INR was 2.5 and today's INR is 2.9.    Anticoagulation Management Assessment/Plan:      The target INR is 2.0-3.0.  She is to have a PT/INR in 4 weeks.  Anticoagulation instructions were given to patient.         Current Anticoagulation Instructions: The patient is to continue with the same dose of coumadin.  This dosage includes:  Coumadin 4 mg tabs:  Sunday - 4 mg, Monday - 4 mg, Tuesday - 4 mg, Wednesday - 4 mg, Thursday - 4 mg, Friday - 4 mg, Saturday - 4 mg.    Repeat PT/INR in 4 weeks.

## 2010-05-03 NOTE — Assessment & Plan Note (Signed)
Summary: Ankle pain, leg swelling   Vital Signs:  Patient profile:   77 year old female Height:      63 inches Weight:      111 pounds Pulse rate:   77 / minute BP sitting:   97 / 59  (left arm) Cuff size:   regular  Vitals Entered By: Kathlene November (April 22, 2009 2:08 PM) CC: knee and ankle pain, lower extremities swelling   Primary Care Provider:  Linford Arnold, C  CC:  knee and ankle pain and lower extremities swelling.  History of Present Illness: ankle pain, lower extremities swelling.  Both ankles are very painful and it is keeping her awake. Feels like has to move.  Had xray on the right tha showed no significant OA.  Has seen podiatry but it is not helping. Wants to cry when she ealks. Pain is less when no pressure on her legs. Says the pain is similar to when had a clot in her neck.  That is why she is worried it is a blood clot. Wears her compression stocking everyday. Says feels like crying when she stands on her feet. Some days are better than others.  Didn't every try the volteran gel after she read the side effects.    Current Medications (verified): 1)  Furosemide 40 Mg  Tabs (Furosemide) .... Take 1 Tablet By Mouth Once A Day As Needed 2)  Klor-Con 10 10 Meq  Tbcr (Potassium Chloride) .... Take One Tablet By Mouth Once Ad Ay 3)  Calcium 600 600 Mg  Tabs (Calcium Carbonate) .... Take One Tablet By Mouth Once A Day 4)  Multivitamins   Tabs (Multiple Vitamin) .... Take One Tablet By Mouth Once Ad Ay 5)  Vitamin D 1000mg  .... One By Mouth Daily 6)  Glucosamine-Chondroitin 1500-1200 Mg/93ml Liqd (Glucosamine-Chondroitin) 7)  Ultram 50 Mg Tabs (Tramadol Hcl) .... Take 1 Tablet By Mouth Three Times A Day As Needed Pain 8)  Voltaren 1 % Gel (Diclofenac Sodium) .... Apply To Affected Joint Up To 4 X A Day 9)  Coumadin 2 Mg Tabs (Warfarin Sodium) .... Sunday - 4 Mg, Monday - 4 Mg, Tuesday - 2 Mg, Wednesday - 4 Mg, Thursday - 4 Mg, Friday - 4 Mg, Saturday - 2 Mg 10)  Coumadin 4 Mg  Tabs (Warfarin Sodium) .... Sunday - 4 Mg, Monday - 4 Mg, Tuesday - 2 Mg, Wednesday - 4 Mg, Thursday - 4 Mg, Friday - 4 Mg, Saturday - 2 Mg  Allergies (verified): 1)  ! Bactrim 2)  ! Cipro 3)  ! Zithromax 4)  ! Macrobid 5)  ! Fosamax 6)  Tramadol Hcl (Tramadol Hcl)  Comments:  Nurse/Medical Assistant: The patient's medications and allergies were reviewed with the patient and were updated in the Medication and Allergy Lists. 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  MRI - Showed degenerative arthritis in left  shoulder adn neck 07-17-06 Dr. Donnie Coffin Cardiac arrest requiring ACLS int he field 03/2005 hx of colon Cancer, sx, radiation and chem 1999/2000 Left Hip Fracture on 07/2007 skin cancer on the right lower leg. - Dr. Lambert Keto.   Past Surgical History: Last updated: 09/11/2007 Cataract surgery 1999 right cholecystectomy 1999 Colon Cancer 1999 - s/p resection and radiatio  Family History: Father with alcoholism,  gout.  MOther with Colon Ca, leg amputation, blood clot. Father and Brothers with MI Mother wieht depression, hi cholesterol Father and sister  with DM Sister with Breast Ca Father with HTN, Bladder tumor  Aunt with Rhueumatoid arthritis.   Physical Exam  General:  Well-developed,well-nourished,in no acute distress; alert,appropriate and cooperative throughout examination Lungs:  Normal respiratory effort, chest expands symmetrically. Lungs are clear to auscultation, no crackles or wheezes. Heart:  Normal rate and regular rhythm. S1 and S2 normal without gallop, murmur, click, rub or other extra sounds. Msk:  BIlat ankles are very tender all over.  No tenderness on the tops of teh feet or in the calves.  NROM of both  ankles.   Extremities:  1+ pitting edema on Lower legs bilat from the knees down.She has her compression stockings on today.   Neurologic:  alert & oriented X3.  Used a can to ambulate slowly.  Skin:  no rashes.   Psych:  Cognition and judgment appear intact. Alert and cooperative with normal attention span and concentration. No apparent delusions, illusions, hallucinations   Impression & Recommendations:  Problem # 1:  ANKLE PAIN (ICD-719.47) Assessment Deteriorated  Pt really worried aobut her blood flow thought tried ot reassure her I don't think this is a blood clot. Will set up for dopplers. Her last INR about 2 weeks ago was therapeutic and her INRs are usually very steady.  If neg then will schedule for an MRI of her ankle.  Will check uric acid to ruel out gout. Recheck sed rate since was elevated last time. Recommend try the volteran gel. Encouraged her that really this is a safe medication and since topical much less SE than an oral NSAID.    Orders: T-*Unlisted Diagnostic X-ray test/procedure (52841)  Problem # 2:  DEPENDENT EDEMA, LEGS, BILATERAL (ICD-782.3) This is chronic and not new. It is consistant with venous stasis and the tx is her compression stocking which she wears faithfully adn her as needed lasix.   Her updated medication list for this problem includes:    Furosemide 40 Mg Tabs (Furosemide) .Marland Kitchen... Take 1 tablet by mouth once a day as needed  Complete Medication List: 1)  Furosemide 40 Mg Tabs (Furosemide) .... Take 1 tablet by mouth once a day as needed 2)  Klor-con 10 10 Meq Tbcr (Potassium chloride) .... Take one tablet by mouth once ad ay 3)  Calcium 600 600 Mg Tabs (Calcium carbonate) .... Take one tablet by mouth once a day 4)  Multivitamins Tabs (Multiple vitamin) .... Take one tablet by mouth once ad ay 5)  Vitamin D 1000mg   .... One by mouth daily 6)  Glucosamine-chondroitin 1500-1200 Mg/33ml Liqd (Glucosamine-chondroitin) 7)  Ultram 50 Mg Tabs (Tramadol  hcl) .... Take 1 tablet by mouth three times a day as needed pain 8)  Voltaren 1 % Gel (Diclofenac sodium) .... Apply to affected joint up to 4 x a day 9)  Coumadin 2 Mg Tabs (Warfarin sodium) .... Sunday - 4 mg, monday - 4 mg, tuesday - 2 mg, wednesday - 4 mg, thursday - 4 mg, friday - 4 mg, saturday - 2 mg 10)  Coumadin 4 Mg Tabs (Warfarin sodium) .... Sunday - 4 mg, monday - 4 mg, tuesday - 2 mg, wednesday - 4 mg, thursday - 4 mg, friday - 4 mg, saturday - 2 mg  Other Orders: T-Uric Acid (Blood) (32440-10272) T-Sed Rate (Automated) (53664-40347)  Flex Sig Next Due:  Not Indicated Hemoccult Next Due:  Not Indicated  Appended Document: Ankle pain, leg swelling  TD Next Due:  Not Indicated Pneumovax Result Date:  04/03/1998 Pneumovax Result:  given Pneumovax Next Due:  Not Indicated

## 2010-05-03 NOTE — Assessment & Plan Note (Signed)
Summary: nurse re check in 2 wks- jr  Nurse Visit   Allergies: 1)  ! Bactrim 2)  ! Cipro 3)  ! Zithromax 4)  ! Macrobid 5)  ! Fosamax 6)  Tramadol Hcl (Tramadol Hcl) Laboratory Results   Blood Tests   Date/Time Received: 07/28/2009 Date/Time Reported: 07/28/2009  PT: 12.7 s   (Normal Range: 10.6-13.4)  INR: 1.1   (Normal Range: 0.88-1.12   Therap INR: 2.0-3.5)    Orders Added: 1)  Fingerstick [36416] 2)  Protime INR [[id number]]   Anticoagulation Management History:      The patient is on coumadin and comes in today for a routine follow up visit.  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  Plans are to keep the patient on coumadin for life.  Her last INR was 1.6 and today's INR is 1.1.    Anticoagulation Management Assessment/Plan:      The target INR is 2.0-3.0.  She is to have a PT/INR in 1 week.  Anticoagulation instructions were given to patient.         Current Anticoagulation Instructions: The patient's dosage of coumadin will be increased.  The new dosage includes: TAke 6 mg for 2 days adn then start the above regimen. Coumadin 2 mg tabs:  Sunday - 4 mg, Monday - 4 mg, Tuesday - 4 mg, Wednesday - 4 mg, Thursday - 4 mg, Friday - 4 mg, Saturday - 4 mg.  Repeat PT/INR in 1 week.

## 2010-05-03 NOTE — Progress Notes (Signed)
Summary: Call A Nurse  Phone Note From Other Clinic   Caller: Call A Nurse Summary of Call: [facility] Triage Call Report Triage Record Num: [id number] Operator: Edythe Lynn Patient Name: Gabrielle Stafford Call Date & Time: 06/17/2009 4:40:02AM Patient Phone: PCP: Nani Gasser Patient Gender: Female PCP Fax : [phone] Patient DOB: Jul 16, 1929 Practice Name: [facility] Reason for Call: Victorino Dike from [facility] called to report that pt's blood work drawn on 06-16-09 showed a critical low K+ level of 2.5 and calcium of 5.7 . DR Fabian Sharp advised/ order tio fax to office. Protocol(s) Used: Office Note Recommended Outcome per Protocol: Information Noted and Sent to Office Reason for Outcome: Caller information to office Care Advice:  Initial call taken by: Payton Spark CMA,  June 17, 2009 8:21 AM

## 2010-05-03 NOTE — Assessment & Plan Note (Signed)
Summary: PT  Nurse Visit   Allergies: 1)  ! Bactrim 2)  ! Cipro 3)  ! Zithromax 4)  ! Macrobid 5)  ! Fosamax 6)  Tramadol Hcl (Tramadol Hcl) Laboratory Results   Blood Tests   Date/Time Received: 07/14/2009 Date/Time Reported: 07/14/2009  PT: 15.7 s   (Normal Range: 10.6-13.4)  INR: 1.6   (Normal Range: 0.88-1.12   Therap INR: 2.0-3.5)    Orders Added: 1)  Fingerstick [36416] 2)  Protime INR [[id number]]   Anticoagulation Management History:      The patient is on coumadin and comes in today for a routine follow up visit.  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  Plans are to keep the patient on coumadin for life.  Her last INR was 2.5 and today's INR is 1.6.    Anticoagulation Management Assessment/Plan:      The target INR is 2.0-3.0.  She is to have a PT/INR in 2 weeks.  Anticoagulation instructions were given to patient.         Current Anticoagulation Instructions: The patient's dosage of coumadin will be increased.  The new dosage includes: Take 6 mg total today. Then follow the regimen above and recheck in 2 weeks. Coumadin 2 mg tabs and Coumadin 4 mg tabs:  Sunday - 4 mg, Monday - 4 mg, Tuesday - 2 mg, Wednesday - 4 mg, Thursday - 4 mg, Friday - 4 mg, Saturday - 4 mg.  Repeat PT/INR in 2 weeks.     Appended Document: PT

## 2010-05-05 NOTE — Assessment & Plan Note (Signed)
Summary: INR  Nurse Visit   Allergies: 1)  ! Bactrim 2)  ! Cipro 3)  ! Zithromax 4)  ! Macrobid 5)  ! Fosamax 6)  Tramadol Hcl (Tramadol Hcl) Laboratory Results   Blood Tests   Date/Time Received: 04/22/10 Date/Time Reported: 04/22/10   INR: 2.8   (Normal Range: 0.88-1.12   Therap INR: 2.0-3.5)    Orders Added: 1)  Fingerstick [36416] 2)  Protime INR [[id number]]   Anticoagulation Management History:      The patient is on coumadin and comes in today for a routine follow up visit.  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  Plans are to keep the patient on coumadin for life.  Her last INR was 3.4 and today's INR is 2.8.    Anticoagulation Management Assessment/Plan:      The target INR is 2.0-3.0.  She is to have a PT/INR in 4 weeks.  Anticoagulation instructions were given to patient.         Current Anticoagulation Instructions: The patient is to continue with the same dose of coumadin.  This dosage includes:   Coumadin 3 mg tabs and Coumadin 1 mg tabs:  Sunday - 4 mg, Monday - 4 mg, Tuesday - 4 mg, Wednesday - 4 mg, Thursday - 4 mg, Friday - 3 mg, Saturday - 4 mg.    Repeat PT/INR in 4 weeks.

## 2010-05-05 NOTE — Progress Notes (Signed)
Summary: Stop iron  Phone Note Outgoing Call   Summary of Call: Can stop the xtra iron and recheck in 3 months.  Initial call taken by: Nani Gasser MD,  April 25, 2010 9:29 AM     Appended Document: Stop iron LMOM informing Pt of the above

## 2010-05-05 NOTE — Assessment & Plan Note (Signed)
Summary: INR  Nurse Visit   Allergies: 1)  ! Bactrim 2)  ! Cipro 3)  ! Zithromax 4)  ! Macrobid 5)  ! Fosamax 6)  Tramadol Hcl (Tramadol Hcl) Laboratory Results   Blood Tests   Date/Time Received: 04/15/10 Date/Time Reported: 04/15/10   INR: 3.4   (Normal Range: 0.88-1.12   Therap INR: 2.0-3.5)    Orders Added: 1)  Fingerstick [36416] 2)  Protime INR [[id number]]   Anticoagulation Management History:      The patient is on coumadin and comes in today for a routine follow up visit.  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  Plans are to keep the patient on coumadin for life.  Her last INR was 2.8 and today's INR is 3.4.    Anticoagulation Management Assessment/Plan:      The target INR is 2.0-3.0.  She is to have a PT/INR in 1 week.  Anticoagulation instructions were given to patient.         Current Anticoagulation Instructions: The patient's dosage of coumadin will be decreased.  The new dosage includes: Coumadin 4 mg tabs and Coumadin 1 mg tabs:  Sunday - 4 mg, Monday - 4 mg, Tuesday - 4 mg, Wednesday - 4 mg, Thursday - 4 mg, Friday - 3 mg, Saturday - 4 mg.  Repeat PT/INR in 1 week.

## 2010-05-20 ENCOUNTER — Ambulatory Visit (INDEPENDENT_AMBULATORY_CARE_PROVIDER_SITE_OTHER): Payer: Medicare Other

## 2010-05-20 ENCOUNTER — Encounter: Payer: Self-pay | Admitting: Family Medicine

## 2010-05-20 DIAGNOSIS — Z7901 Long term (current) use of anticoagulants: Secondary | ICD-10-CM

## 2010-05-25 NOTE — Assessment & Plan Note (Signed)
Summary: INR  Nurse Visit   Allergies: 1)  ! Bactrim 2)  ! Cipro 3)  ! Zithromax 4)  ! Macrobid 5)  ! Fosamax 6)  Tramadol Hcl (Tramadol Hcl) Laboratory Results   Blood Tests   Date/Time Received: 05/20/10 Date/Time Reported: 021/7/12   INR: 4.0   (Normal Range: 0.88-1.12   Therap INR: 2.0-3.5)    Orders Added: 1)  Fingerstick [36416] 2)  Protime INR [[id number]]   Anticoagulation Management History:      The patient is on coumadin and comes in today for a routine follow up visit.  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  Plans are to keep the patient on coumadin for life.  Her last INR was 2.8 and today's INR is 4.0.    Anticoagulation Management Assessment/Plan:      The target INR is 2.0-3.0.  She is to have a PT/INR in 1 week.  Anticoagulation instructions were given to patient.         Current Anticoagulation Instructions: The patient is to hold (do not take) the Friday dose of coumadin.  The dosage to be resumed includes: The patient is to hold (do not take) the Saturday dose of coumadin.  The dosage to be resumed includes: Coumadin 3 mg tabs and Coumadin 1 mg tabs:  Sunday - 3 mg, Monday - 4 mg, Tuesday - 3 mg, Wednesday - 4 mg, Thursday - 4 mg, Friday - 3 mg, Saturday - 4 mg.  Repeat PT/INR in 1 week.

## 2010-05-27 ENCOUNTER — Ambulatory Visit: Payer: Medicare Other

## 2010-06-14 ENCOUNTER — Telehealth: Payer: Self-pay | Admitting: Family Medicine

## 2010-06-20 ENCOUNTER — Encounter (INDEPENDENT_AMBULATORY_CARE_PROVIDER_SITE_OTHER): Payer: Medicare Other

## 2010-06-20 ENCOUNTER — Telehealth: Payer: Self-pay | Admitting: Family Medicine

## 2010-06-20 ENCOUNTER — Encounter: Payer: Self-pay | Admitting: Family Medicine

## 2010-06-20 DIAGNOSIS — Z7901 Long term (current) use of anticoagulants: Secondary | ICD-10-CM

## 2010-06-20 LAB — CONVERTED CEMR LAB: INR: 3.2

## 2010-06-21 NOTE — Progress Notes (Signed)
  Phone Note Refill Request Message from:  Fax from Pharmacy on June 14, 2010 4:49 PM  Refills Requested: Medication #1:  KLOR-CON 10 10 MEQ  TBCR Take 2 tabs by mouth once daily Initial call taken by: Avon Gully CMA, Duncan Dull),  June 14, 2010 4:49 PM    Prescriptions: KLOR-CON 10 10 MEQ  TBCR (POTASSIUM CHLORIDE) Take 2 tabs by mouth once daily  #180 x 0   Entered by:   Avon Gully CMA, (AAMA)   Authorized by:   Nani Gasser MD   Signed by:   Avon Gully CMA, (AAMA) on 06/14/2010   Method used:   Electronically to        [facility] (retail)       [address]       [place], [place]  [address]       Ph: [phone]       Fax: [phone]   RxID:   [id number]

## 2010-06-30 NOTE — Progress Notes (Signed)
  Phone Note Refill Request Message from:  Patient on June 20, 2010 4:21 PM  Refills Requested: Medication #1:  KLOR-CON 10 10 MEQ  TBCR Take 2 tabs by mouth once daily pt states he wants [facility] not [facility]  Initial call taken by: Avon Gully CMA, Duncan Dull),  June 20, 2010 4:21 PM    Prescriptions: KLOR-CON 10 10 MEQ  TBCR (POTASSIUM CHLORIDE) Take 2 tabs by mouth once daily  #180 x 0   Entered by:   Avon Gully CMA, (AAMA)   Authorized by:   Nani Gasser MD   Signed by:   Avon Gully CMA, (AAMA) on 06/20/2010   Method used:   Electronically to        [facility] [phone]* (retail)       [address] [place], [place]  [address]       Ph: [phone]       Fax: [phone]   RxID:   [id number]

## 2010-06-30 NOTE — Assessment & Plan Note (Signed)
Summary: INR  Nurse Visit   Allergies: 1)  ! Bactrim 2)  ! Cipro 3)  ! Zithromax 4)  ! Macrobid 5)  ! Fosamax 6)  Tramadol Hcl (Tramadol Hcl) Laboratory Results   Blood Tests   Date/Time Received: 06/20/10 Date/Time Reported: 06/20/10   INR: 3.2   (Normal Range: 0.88-1.12   Therap INR: 2.0-3.5)    Orders Added: 1)  Fingerstick [36416] 2)  Protime INR [[id number]]   Anticoagulation Management History:      The patient is on coumadin and comes in today for a routine follow up visit.  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  Plans are to keep the patient on coumadin for life.  Her last INR was 4.0 and today's INR is 3.2.    Anticoagulation Management Assessment/Plan:      The target INR is 2.0-3.0.  She is to have a PT/INR in 2 weeks.  Anticoagulation instructions were given to patient.         Current Anticoagulation Instructions: The patient is to continue with the same dose of coumadin.  This dosage includes: Coumadin 3 mg tabs and Coumadin 1 mg tabs:  Sunday - 3 mg, Monday - 4 mg, Tuesday - 3 mg, Wednesday - 4 mg, Thursday - 4 mg, Friday - 3 mg, Saturday - 4 mg.  Repeat PT/INR in 2 weeks.

## 2010-07-06 ENCOUNTER — Ambulatory Visit (INDEPENDENT_AMBULATORY_CARE_PROVIDER_SITE_OTHER): Payer: Medicare Other | Admitting: Family Medicine

## 2010-07-06 DIAGNOSIS — I2699 Other pulmonary embolism without acute cor pulmonale: Secondary | ICD-10-CM

## 2010-07-06 NOTE — Progress Notes (Signed)
  Subjective:    Patient ID: Gabrielle Stafford, female    DOB: 1929/10/14, 76 y.o.   MRN: [id number]  HPI    Review of Systems     Objective:   Physical Exam        Assessment & Plan:  a

## 2010-07-13 ENCOUNTER — Other Ambulatory Visit: Payer: Self-pay | Admitting: Family Medicine

## 2010-07-14 ENCOUNTER — Other Ambulatory Visit: Payer: Self-pay | Admitting: *Deleted

## 2010-07-14 MED ORDER — WARFARIN SODIUM 4 MG PO TABS: 4.0000 mg | ORAL_TABLET | Freq: Every day | ORAL | Status: DC

## 2010-07-27 ENCOUNTER — Ambulatory Visit (INDEPENDENT_AMBULATORY_CARE_PROVIDER_SITE_OTHER): Payer: Medicare Other | Admitting: Family Medicine

## 2010-07-27 DIAGNOSIS — I2699 Other pulmonary embolism without acute cor pulmonale: Secondary | ICD-10-CM

## 2010-09-08 ENCOUNTER — Ambulatory Visit (INDEPENDENT_AMBULATORY_CARE_PROVIDER_SITE_OTHER): Payer: Medicare Other | Admitting: Family Medicine

## 2010-09-08 DIAGNOSIS — I2699 Other pulmonary embolism without acute cor pulmonale: Secondary | ICD-10-CM

## 2010-09-19 ENCOUNTER — Other Ambulatory Visit: Payer: Self-pay | Admitting: Family Medicine

## 2010-09-21 ENCOUNTER — Encounter: Payer: Self-pay | Admitting: Family Medicine

## 2010-09-22 ENCOUNTER — Other Ambulatory Visit (HOSPITAL_COMMUNITY)
Admission: RE | Admit: 2010-09-22 | Discharge: 2010-09-22 | Disposition: A | Discharge status: Home / Self Care | Payer: Medicare Other | Source: Ambulatory Visit | Attending: Family Medicine | Admitting: Family Medicine

## 2010-09-22 ENCOUNTER — Encounter: Payer: Self-pay | Admitting: Family Medicine

## 2010-09-22 ENCOUNTER — Ambulatory Visit (INDEPENDENT_AMBULATORY_CARE_PROVIDER_SITE_OTHER): Payer: Medicare Other | Admitting: Family Medicine

## 2010-09-22 ENCOUNTER — Ambulatory Visit: Payer: Self-pay | Admitting: Family Medicine

## 2010-09-22 DIAGNOSIS — Z0000 Encounter for general adult medical examination without abnormal findings: Secondary | ICD-10-CM

## 2010-09-22 DIAGNOSIS — M8580 Other specified disorders of bone density and structure, unspecified site: Secondary | ICD-10-CM

## 2010-09-22 DIAGNOSIS — Z23 Encounter for immunization: Secondary | ICD-10-CM

## 2010-09-22 DIAGNOSIS — Z124 Encounter for screening for malignant neoplasm of cervix: Secondary | ICD-10-CM | POA: Insufficient documentation

## 2010-09-22 DIAGNOSIS — E785 Hyperlipidemia, unspecified: Secondary | ICD-10-CM

## 2010-09-22 DIAGNOSIS — Z1159 Encounter for screening for other viral diseases: Secondary | ICD-10-CM | POA: Insufficient documentation

## 2010-09-22 DIAGNOSIS — Z1231 Encounter for screening mammogram for malignant neoplasm of breast: Secondary | ICD-10-CM

## 2010-09-22 DIAGNOSIS — E538 Deficiency of other specified B group vitamins: Secondary | ICD-10-CM

## 2010-09-22 DIAGNOSIS — I2699 Other pulmonary embolism without acute cor pulmonale: Secondary | ICD-10-CM

## 2010-09-22 LAB — POCT INR: INR: 1.8

## 2010-09-22 NOTE — Progress Notes (Signed)
Subjective:     Gabrielle Stafford is a 77 y.o. female and is here for a comprehensive physical exam. The patient reports no problems.She says she has neglected herself this last year as she was taking care of her really ill husband who passed away last month. Overdue for her colonoscopy and has hx of colon Ca.    History   Social History  . Marital Status: Married    Spouse Name: N/A    Number of Children: N/A  . Years of Education: N/A   Occupational History  . retired    Social History Main Topics  . Smoking status: Former Smoker    Types: Cigarettes    Quit date: 04/03/1997  . Smokeless tobacco: Not on file  . Alcohol Use: No  . Drug Use: No  . Sexually Active: Not on file     portrait artist, retired, assoc degree, married, regularly exercises   Other Topics Concern  . Not on file   Social History Narrative   Widowed. Husband died in 25-Aug-2010.  No regular exercise.     Health Maintenance  Topic Date Due  . Tetanus/tdap  08/29/1950  . Colonoscopy  02/11/2010  . Influenza Vaccine  01/02/2011  . Pneumococcal Polysaccharide Vaccine Age 73 And Over  Completed  . Zostavax  Completed    The following portions of the patient's history were reviewed and updated as appropriate: allergies, current medications, past family history, past medical history, past social history and past surgical history.  Review of Systems A comprehensive review of systems was negative.   Objective:    BP 110/62  Pulse 83  Ht 5\' 3"  (1.6 m)  Wt 125 lb (56.7 kg)  BMI 22.14 kg/m2 General appearance: alert, cooperative and appears stated age Head: Normocephalic, without obvious abnormality, atraumatic Eyes: conjunctiva are clear, PEERLA, EOMi.  Ears: normal TM's and external ear canals both ears Nose: Nares normal. Septum midline. Mucosa normal. No drainage or sinus tenderness. Throat: lips, Op normla. Poor dentition Neck: no adenopathy, no carotid bruit, supple, symmetrical, trachea midline and  thyroid not enlarged, symmetric, no tenderness/mass/nodules Back: symmetric, no curvature. ROM normal. No CVA tenderness. Lungs: clear to auscultation bilaterally Breasts: normal appearance, no masses or tenderness Heart: regular rate and rhythm, S1, S2 normal, no murmur, click, rub or gallop Abdomen: soft, non-tender; bowel sounds normal; no masses,  no organomegaly Pelvic: cervix normal in appearance, external genitalia normal, no adnexal masses or tenderness, no cervical motion tenderness, rectovaginal septum normal, uterus normal size, shape, and consistency and vagina normal without discharge. Vaginal atrophy.   Extremities: extremities normal, atraumatic, no cyanosis or edema Pulses: 2+ and symmetric Skin: Skin color, texture, turgor normal. No rashes or lesions Lymph nodes: Cervical, supraclavicular, and axillary nodes normal. Neurologic: Grossly normal    Assessment:    Healthy female exam.  Plan:     See After Visit Summary for Counseling Recommendations  1. Healthy diet and exercise 2. Due for bone density and mammogram 3. One year past due for colonosocpy so reminded her to call. We offered to schedule it for her but she says she will do it.  4. Due for screening labs. Givne lab slip. 5. Make sure getting adequate calcium in her diet 6. Will call with the pap results.  7. TDAP up dated.  8. See coumadin encounter for adjusting her coumadin dose. She was not taking the dose we had in the system. She says she has been using 4mg  tabs. No bleeding.

## 2010-09-23 ENCOUNTER — Telehealth: Payer: Self-pay | Admitting: Family Medicine

## 2010-09-23 LAB — COMPLETE METABOLIC PANEL WITH GFR
AST: 17 U/L (ref 0–37)
Albumin: 4.1 g/dL (ref 3.5–5.2)
Alkaline Phosphatase: 94 U/L (ref 39–117)
BUN: 21 mg/dL (ref 6–23)
Calcium: 9.7 mg/dL (ref 8.4–10.5)
Chloride: 103 mEq/L (ref 96–112)
GFR, Est Non African American: 43 mL/min — ABNORMAL LOW (ref >60)
Glucose, Bld: 91 mg/dL (ref 70–99)
Potassium: 3.6 mEq/L (ref 3.5–5.3)
Sodium: 143 mEq/L (ref 135–145)
Total Protein: 6.8 g/dL (ref 6.0–8.3)

## 2010-09-23 LAB — LIPID PANEL
Cholesterol: 204 mg/dL — ABNORMAL HIGH (ref 0–200)
Total CHOL/HDL Ratio: 2.8 Ratio

## 2010-09-23 NOTE — Telephone Encounter (Signed)
Pt notified by [facility] that lab results (chol) look better.  Still need to work on the triglycerides.  Add fish oil or flaxseed oil. Jarvis Newcomer, LPN [facility]

## 2010-09-23 NOTE — Telephone Encounter (Signed)
Palpation: Cholesterol overall looks better. Her triglycerides are 207 we still want to get this under 150 at all possible. She can take fish oil or flaxseed in her diet and this will help. Potassium and kidney function are stable she still has stage III chronic kidney disease. Vitamin B12 and vitamin D looked great.

## 2010-09-30 ENCOUNTER — Telehealth: Payer: Self-pay | Admitting: Family Medicine

## 2010-09-30 NOTE — Telephone Encounter (Signed)
Pt aware of the above  

## 2010-09-30 NOTE — Telephone Encounter (Signed)
Pls let pt know that her pap smear came back normal. 

## 2010-10-05 ENCOUNTER — Telehealth: Payer: Self-pay | Admitting: Family Medicine

## 2010-10-05 NOTE — Telephone Encounter (Signed)
Please call patient. Normal mammogram.   

## 2010-10-06 NOTE — Telephone Encounter (Signed)
Pt advised of results, states she will come in AM for INR>

## 2010-10-07 ENCOUNTER — Ambulatory Visit (INDEPENDENT_AMBULATORY_CARE_PROVIDER_SITE_OTHER): Payer: Medicare Other | Admitting: Family Medicine

## 2010-10-07 VITALS — BP 128/74 | HR 126 | Resp 20 | Wt 128.0 lb

## 2010-10-07 DIAGNOSIS — I2699 Other pulmonary embolism without acute cor pulmonale: Secondary | ICD-10-CM

## 2010-10-19 ENCOUNTER — Encounter: Payer: Self-pay | Admitting: Family Medicine

## 2010-10-21 ENCOUNTER — Ambulatory Visit: Payer: Medicare Other

## 2010-10-21 ENCOUNTER — Telehealth: Payer: Self-pay | Admitting: *Deleted

## 2010-10-21 DIAGNOSIS — I2699 Other pulmonary embolism without acute cor pulmonale: Secondary | ICD-10-CM

## 2010-10-21 NOTE — Telephone Encounter (Signed)
Labs ordered.

## 2010-10-25 ENCOUNTER — Ambulatory Visit
Admission: RE | Admit: 2010-10-25 | Discharge: 2010-10-25 | Disposition: A | Discharge status: Home / Self Care | Payer: Medicare Other | Source: Ambulatory Visit | Attending: Family Medicine | Admitting: Family Medicine

## 2010-10-27 ENCOUNTER — Telehealth: Payer: Self-pay | Admitting: Family Medicine

## 2010-10-27 MED ORDER — IBANDRONATE SODIUM 150 MG PO TABS: 150.0000 mg | ORAL_TABLET | ORAL | Status: DC

## 2010-10-27 NOTE — Telephone Encounter (Signed)
She needs to incrase calcium to bid and we will add boniva. Will send new rx to pharm.

## 2010-10-27 NOTE — Telephone Encounter (Signed)
Call pt: She has osteoporosis on her bone density. Seh really needs to be on a bone builder. What se did she have on alendronate.?

## 2010-10-27 NOTE — Telephone Encounter (Signed)
Pt  Advised of results.Pt states she takes 1 calcium tab daily (630 mg)

## 2010-10-27 NOTE — Telephone Encounter (Signed)
Advised pt of rec to inc calcium and Rx to pharmacy.  Pt agreed.

## 2010-11-01 ENCOUNTER — Other Ambulatory Visit: Payer: Self-pay | Admitting: *Deleted

## 2010-11-01 MED ORDER — FUROSEMIDE 40 MG PO TABS: 40.0000 mg | ORAL_TABLET | Freq: Every day | ORAL | Status: DC

## 2010-12-01 ENCOUNTER — Telehealth: Payer: Self-pay | Admitting: Family Medicine

## 2010-12-01 NOTE — Telephone Encounter (Signed)
Patient is coming in for a coumadin check and req to get a prescription refill for furosemide today. Pt states she has 30 tablets right now and would like it increased to 60 tablets. Also request to either have script called in to pharmacy or take a paper script with her while she is here for her nurse visit.

## 2010-12-02 ENCOUNTER — Ambulatory Visit (INDEPENDENT_AMBULATORY_CARE_PROVIDER_SITE_OTHER): Payer: Medicare Other | Admitting: Family Medicine

## 2010-12-02 ENCOUNTER — Other Ambulatory Visit: Payer: Self-pay | Admitting: *Deleted

## 2010-12-02 VITALS — BP 128/68 | HR 68

## 2010-12-02 DIAGNOSIS — Z5181 Encounter for therapeutic drug level monitoring: Secondary | ICD-10-CM

## 2010-12-02 DIAGNOSIS — Z7901 Long term (current) use of anticoagulants: Secondary | ICD-10-CM

## 2010-12-02 DIAGNOSIS — I2699 Other pulmonary embolism without acute cor pulmonale: Secondary | ICD-10-CM

## 2010-12-02 MED ORDER — WARFARIN SODIUM 4 MG PO TABS: 4.0000 mg | ORAL_TABLET | Freq: Every day | ORAL | Status: DC

## 2010-12-02 MED ORDER — FUROSEMIDE 40 MG PO TABS: 40.0000 mg | ORAL_TABLET | Freq: Every day | ORAL | Status: DC

## 2010-12-02 NOTE — Progress Notes (Signed)
  Subjective:    Patient ID: Gabrielle Stafford, female    DOB: 1929/06/17, 77 y.o.   MRN: [id number]  HPI INR needed     Review of Systems     Objective:   Physical Exam        Assessment & Plan:

## 2010-12-16 ENCOUNTER — Ambulatory Visit (INDEPENDENT_AMBULATORY_CARE_PROVIDER_SITE_OTHER): Payer: Medicare Other | Admitting: Family Medicine

## 2010-12-16 DIAGNOSIS — Z23 Encounter for immunization: Secondary | ICD-10-CM

## 2010-12-16 DIAGNOSIS — Z7901 Long term (current) use of anticoagulants: Secondary | ICD-10-CM

## 2010-12-16 DIAGNOSIS — Z5181 Encounter for therapeutic drug level monitoring: Secondary | ICD-10-CM

## 2010-12-16 DIAGNOSIS — I2699 Other pulmonary embolism without acute cor pulmonale: Secondary | ICD-10-CM

## 2010-12-16 NOTE — Progress Notes (Signed)
  Subjective:    Patient ID: Gabrielle Stafford, female    DOB: 12-07-1929, 77 y.o.   MRN: [id number]  HPI INR and flu shot   Review of Systems     Objective:   Physical Exam        Assessment & Plan:

## 2010-12-29 ENCOUNTER — Ambulatory Visit (INDEPENDENT_AMBULATORY_CARE_PROVIDER_SITE_OTHER): Payer: Medicare Other | Admitting: Family Medicine

## 2010-12-29 ENCOUNTER — Telehealth: Payer: Self-pay | Admitting: Family Medicine

## 2010-12-29 VITALS — BP 138/76 | HR 88 | Resp 20 | Ht 65.0 in | Wt 131.0 lb

## 2010-12-29 DIAGNOSIS — I2699 Other pulmonary embolism without acute cor pulmonale: Secondary | ICD-10-CM

## 2010-12-29 NOTE — Telephone Encounter (Signed)
Pt had requested refills on Klorcon and lasix when she was in the office today for coumadin check, Plan:  Pt chart file reviewed.  Refills good on both of these medications.  LMOM for the pt along with the coumadin regimen and when to repeat level.  Jarvis Newcomer, LPN [facility]

## 2010-12-29 NOTE — Progress Notes (Signed)
  Subjective:    Patient ID: Gabrielle Stafford, female    DOB: 1929-08-15, 77 y.o.   MRN: [id number]  HPI    Review of Systems     Objective:   Physical Exam        Assessment & Plan:  Pt presents today for nurse visit for coumadin check.  Pt theraupeutic at 2.9.  Pt discharged and told we will call her with dosing regimen and when to follow up for next coumadin check.  Pt uses Adult nurse in [place]. Jarvis Newcomer, LPN [facility] Dimes

## 2010-12-29 NOTE — Patient Instructions (Signed)
Pt notified by phone to repeat the INR in 1 week and if out of town to repeat as soon as returns. Coumadin regimen given in detail.  LMOM for the pt at her home answer machine. Jarvis Newcomer, LPN [facility] Dimes

## 2011-02-15 ENCOUNTER — Other Ambulatory Visit: Payer: Self-pay | Admitting: Family Medicine

## 2011-02-21 ENCOUNTER — Ambulatory Visit (INDEPENDENT_AMBULATORY_CARE_PROVIDER_SITE_OTHER): Payer: Medicare Other | Admitting: Family Medicine

## 2011-02-21 DIAGNOSIS — R350 Frequency of micturition: Secondary | ICD-10-CM

## 2011-02-21 DIAGNOSIS — Z86718 Personal history of other venous thrombosis and embolism: Secondary | ICD-10-CM

## 2011-02-21 DIAGNOSIS — R3129 Other microscopic hematuria: Secondary | ICD-10-CM

## 2011-02-21 LAB — POCT URINALYSIS DIPSTICK
Glucose, UA: NEGATIVE
Spec Grav, UA: 1.02
Urobilinogen, UA: 1

## 2011-02-21 MED ORDER — CEPHALEXIN 500 MG PO CAPS: 500.0000 mg | ORAL_CAPSULE | Freq: Three times a day (TID) | ORAL | Status: AC

## 2011-02-21 NOTE — Progress Notes (Signed)
  Subjective:    Patient ID: Gabrielle Stafford, female    DOB: 05-13-1929, 77 y.o.   MRN: [id number] PT/INR check. Pt complains of urinary frequency, blood in urine. HPI    Review of Systems     Objective:   Physical Exam        Assessment & Plan:  UTI - no fever, nausea, vomiting. She has gross hematuria in her urine. We'll go ahead and start her on Keflex. She has multiple antibiotic allergies. We'll also send a culture. Call with results.

## 2011-02-25 LAB — URINE CULTURE: Colony Count: >=100000

## 2011-02-28 ENCOUNTER — Ambulatory Visit (INDEPENDENT_AMBULATORY_CARE_PROVIDER_SITE_OTHER): Payer: Medicare Other | Admitting: Family Medicine

## 2011-02-28 DIAGNOSIS — I2699 Other pulmonary embolism without acute cor pulmonale: Secondary | ICD-10-CM

## 2011-03-07 ENCOUNTER — Telehealth: Payer: Self-pay | Admitting: *Deleted

## 2011-03-07 ENCOUNTER — Ambulatory Visit (INDEPENDENT_AMBULATORY_CARE_PROVIDER_SITE_OTHER): Payer: Medicare Other | Admitting: Family Medicine

## 2011-03-07 VITALS — BP 115/73 | HR 86

## 2011-03-07 DIAGNOSIS — I2699 Other pulmonary embolism without acute cor pulmonale: Secondary | ICD-10-CM

## 2011-03-07 MED ORDER — FLUCONAZOLE 150 MG PO TABS: 150.0000 mg | ORAL_TABLET | Freq: Once | ORAL | Status: AC

## 2011-03-07 MED ORDER — FUROSEMIDE 40 MG PO TABS: 40.0000 mg | ORAL_TABLET | Freq: Every day | ORAL | Status: DC

## 2011-03-07 MED ORDER — WARFARIN SODIUM 4 MG PO TABS: 4.0000 mg | ORAL_TABLET | Freq: Every day | ORAL | Status: DC

## 2011-03-07 NOTE — Telephone Encounter (Signed)
Pt notified of instructions. KJ LPN 

## 2011-03-07 NOTE — Progress Notes (Signed)
  Subjective:    Patient ID: Gabrielle Stafford, female    DOB: Jul 13, 1929, 77 y.o.   MRN: [id number] PT/INR check. Pt also ask if you would send in Diflucan to [facility]. Antibiotics given to her last week for UTI has caused yeast infection HPI    Review of Systems     Objective:   Physical Exam        Assessment & Plan:

## 2011-03-16 ENCOUNTER — Ambulatory Visit (INDEPENDENT_AMBULATORY_CARE_PROVIDER_SITE_OTHER): Payer: Medicare Other | Admitting: Family Medicine

## 2011-03-16 VITALS — BP 87/57 | HR 85

## 2011-03-16 DIAGNOSIS — I749 Embolism and thrombosis of unspecified artery: Secondary | ICD-10-CM

## 2011-03-16 NOTE — Progress Notes (Signed)
Patient ID: Gabrielle Stafford, female   DOB: [birth year], 77 y.o.   MRN: [id number] INR check  BP low today. Hold lasix and recheck BP in a couple of days. She says she feels tired.

## 2011-03-17 ENCOUNTER — Telehealth: Payer: Self-pay | Admitting: *Deleted

## 2011-03-17 NOTE — Telephone Encounter (Signed)
LMOM informing Pt  

## 2011-03-17 NOTE — Telephone Encounter (Signed)
Pt calls today stating her BP last night was 121/89 P 88 and this AM was 89/66 P 72. Pt planned on going out of town today but wasn't sure if she still should since her blood is thin and BP is low. She would like to know if you think it is OK for her to still go. Please advise.

## 2011-03-17 NOTE — Telephone Encounter (Signed)
If she is feeling well can go out of town. Make sure still skipping lasix and drink plenty of fluids. If feelsing still weak then maybe wait another day or two until feels better.

## 2011-03-21 ENCOUNTER — Encounter: Payer: Self-pay | Admitting: Family Medicine

## 2011-03-23 ENCOUNTER — Ambulatory Visit: Payer: Medicare Other

## 2011-03-24 ENCOUNTER — Ambulatory Visit: Payer: Self-pay | Admitting: Family Medicine

## 2011-03-24 ENCOUNTER — Ambulatory Visit (INDEPENDENT_AMBULATORY_CARE_PROVIDER_SITE_OTHER): Payer: Medicare Other | Admitting: Family Medicine

## 2011-03-24 ENCOUNTER — Encounter: Payer: Self-pay | Admitting: Family Medicine

## 2011-03-24 VITALS — BP 130/70 | HR 93 | Wt 132.0 lb

## 2011-03-24 DIAGNOSIS — R609 Edema, unspecified: Secondary | ICD-10-CM

## 2011-03-24 DIAGNOSIS — E569 Vitamin deficiency, unspecified: Secondary | ICD-10-CM

## 2011-03-24 DIAGNOSIS — I2699 Other pulmonary embolism without acute cor pulmonale: Secondary | ICD-10-CM

## 2011-03-24 DIAGNOSIS — E538 Deficiency of other specified B group vitamins: Secondary | ICD-10-CM

## 2011-03-24 LAB — POCT INR: INR: 2.1

## 2011-03-24 NOTE — Progress Notes (Signed)
Subjective:    Patient ID: Gabrielle Stafford, female    DOB: [birth year], 77 y.o.   MRN: [id number]  HPI She is here to followup on her blood pressure. He was noted to be low a week or 2 ago when she came in for Coumadin check. Asked her keep an eye on it at home and bring a record and followup. Her last B12 injection was possibly 2 weeks ago.  Osteoporosis-she wants to hold off on the Fosamax until she has dental surgery. She read that it was a contraindication on the bottle and she plans on having some major dental work done and it probably January or February. She does take calcium and vitamin D.  Vitamin D deficiency-she has been taking about 2000 international units of vitamin D daily. It is time to recheck her level to see if she can decrease her dose to more of a maintenance dose.  Dependent edema of the legs. She's been taking her Lasix 40 mg daily. She denies any chest pain or short of breath a recent increase in swelling. 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 She has not been wearing her compression stockings since we put her on the Lasix because she felt it was well controlled. She was also worried and heard that wearing compression stockings can cause blood clots.   Review of Systems     Objective:   Physical Exam  Constitutional: She is oriented to person, place, and time. She appears well-developed and well-nourished.  HENT:  Head: Normocephalic and atraumatic.  Neck: Neck supple. No thyromegaly present.  Cardiovascular: Normal rate, regular rhythm and normal heart sounds.   Pulmonary/Chest: Effort normal and breath sounds normal.  Lymphadenopathy:    She has no cervical adenopathy.  Neurological: She is alert and oriented to person, place, and time.  Skin: Skin is warm and dry.  Psychiatric: She has a normal mood and  affect. Her behavior is normal.          Assessment & Plan:  Venous stasis - Dec fluid pill to 1/2 tab since BPs have been as low as 80/50s at home.  Recommend go back to wearing compression stockings if needed. I reassured her that compression stockings should not cause blood clots and in fact they're often used to help prevent blood clots in the hospital after surgery and on long airplane flights and car rides, et [employer] I do recommend she wear them when she is going to be up on her feet for long periods she does not feel compelled to take higher doses of Lasix. I also worry that we may be drying her kidneys out if her blood pressure is actually low so I definitely want to check a CMP today.  Vitamin D de- Taking about 2800IU of vitamin D3 a day  Vitamin B12 def - last shot was 2 weeks ago Due recheck level to make sure thereaputic.   Reminder her due for colonoscopy   Osteoporosis-it is absolutely fine to hold on the Fosamax. She can continue her calcium and vitamin D. When she is cleared after her dental surgery then we can restart the medication.  PE, hx - See coumadin flow sheet.   I also remind her that she is overdue for her colonoscopy and that this seems to be top priority  for this year. She agrees. She says she will call and schedule appointment.

## 2011-03-24 NOTE — Patient Instructions (Signed)
Cut the lasix in half.

## 2011-03-25 LAB — COMPLETE METABOLIC PANEL WITH GFR
ALT: 16 U/L (ref 0–35)
Albumin: 4.1 g/dL (ref 3.5–5.2)
CO2: 24 mEq/L (ref 19–32)
Calcium: 9.2 mg/dL (ref 8.4–10.5)
Chloride: 102 mEq/L (ref 96–112)
Creat: 1.4 mg/dL — ABNORMAL HIGH (ref 0.50–1.10)
GFR, Est African American: 41 mL/min — ABNORMAL LOW
Total Protein: 6.8 g/dL (ref 6.0–8.3)

## 2011-03-25 LAB — VITAMIN B12: Vitamin B-12: 448 pg/mL (ref 211–911)

## 2011-04-24 ENCOUNTER — Other Ambulatory Visit: Payer: Self-pay | Admitting: Family Medicine

## 2011-05-05 ENCOUNTER — Ambulatory Visit (INDEPENDENT_AMBULATORY_CARE_PROVIDER_SITE_OTHER): Payer: Medicare Other | Admitting: Family Medicine

## 2011-05-05 ENCOUNTER — Ambulatory Visit: Payer: Self-pay | Admitting: Family Medicine

## 2011-05-05 ENCOUNTER — Encounter: Payer: Self-pay | Admitting: Family Medicine

## 2011-05-05 ENCOUNTER — Telehealth: Payer: Self-pay | Admitting: *Deleted

## 2011-05-05 DIAGNOSIS — I2699 Other pulmonary embolism without acute cor pulmonale: Secondary | ICD-10-CM

## 2011-05-05 DIAGNOSIS — E876 Hypokalemia: Secondary | ICD-10-CM

## 2011-05-05 DIAGNOSIS — I959 Hypotension, unspecified: Secondary | ICD-10-CM

## 2011-05-05 LAB — POCT INR: INR: 5.6

## 2011-05-05 LAB — PROTIME-INR
INR: 4.41 — ABNORMAL HIGH (ref <1.50)
Prothrombin Time: 41.1 seconds — ABNORMAL HIGH (ref 11.6–15.2)

## 2011-05-05 NOTE — Progress Notes (Signed)
  Subjective:    Patient ID: Gabrielle Stafford, female    DOB: Mar 10, 1930, 78 y.o.   MRN: [id number]  HPI  Hypotension - Says most days took whole tab. Only a few days when took half. Says when halved it her swelling would get worse.  Hasn't been wearing her compression stocking.  Here to recheck her BP.   She restarted her iron.  Out of her magnesium but plans on getting more.   PE - Due for coumadin check today. Says has noticed some brusing on th backs of her hands. Has had several greens and salads this week which she doesn't usually eat. Taking 4mg  daily.   Review of Systems     Objective:   Physical Exam  Constitutional: She is oriented to person, place, and time. She appears well-developed and well-nourished.  HENT:  Head: Normocephalic and atraumatic.  Cardiovascular: Normal rate, regular rhythm and normal heart sounds.   Pulmonary/Chest: Effort normal and breath sounds normal.  Musculoskeletal: She exhibits edema.  Neurological: She is alert and oriented to person, place, and time.  Skin: Skin is warm and dry.  Psychiatric: She has a normal mood and affect. Her behavior is normal.          Assessment & Plan:  HTN -  Really enocuraged her to use a half a tab. I don't want her to fall or injure herself from hypotentsion. Pt says she understands but I suspect she will continue to take a whole tab.   PE- INR too high. Send to lab to confirm. Skip tonights dose and we will call with further instructions this evening.   Hypokalemia - Recheck today.   Hypomagnesium - She ran out of her supplement but plans to restart it.  Her last level had improved significantly.

## 2011-05-05 NOTE — Telephone Encounter (Signed)
Pt.notified

## 2011-05-06 LAB — BASIC METABOLIC PANEL WITH GFR
BUN: 21 mg/dL (ref 6–23)
Calcium: 8.9 mg/dL (ref 8.4–10.5)
Creat: 1.43 mg/dL — ABNORMAL HIGH (ref 0.50–1.10)
GFR, Est African American: 40 mL/min — ABNORMAL LOW
GFR, Est Non African American: 35 mL/min — ABNORMAL LOW

## 2011-05-12 ENCOUNTER — Ambulatory Visit: Payer: Medicare Other | Admitting: Family Medicine

## 2011-05-12 DIAGNOSIS — Z0289 Encounter for other administrative examinations: Secondary | ICD-10-CM

## 2011-06-29 ENCOUNTER — Ambulatory Visit (INDEPENDENT_AMBULATORY_CARE_PROVIDER_SITE_OTHER): Payer: Medicare Other | Admitting: Family Medicine

## 2011-06-29 VITALS — BP 109/74 | HR 95

## 2011-06-29 DIAGNOSIS — Z86718 Personal history of other venous thrombosis and embolism: Secondary | ICD-10-CM

## 2011-06-29 NOTE — Progress Notes (Signed)
  Subjective:    Patient ID: Gabrielle Stafford, female    DOB: May 11, 1929, 78 y.o.   MRN: [id number] INR check. Pt states that she skipped night before last dose because she was bruising alot HPI    Review of Systems     Objective:   Physical Exam        Assessment & Plan:

## 2011-06-29 NOTE — Progress Notes (Signed)
  Subjective:    Patient ID: Gabrielle Stafford, female    DOB: 03-Dec-1929, 78 y.o.   MRN: [id number]  HPI    Review of Systems     Objective:   Physical Exam        Assessment & Plan:  Pt notified of instructions. KJ LPN

## 2011-07-07 ENCOUNTER — Ambulatory Visit (INDEPENDENT_AMBULATORY_CARE_PROVIDER_SITE_OTHER): Payer: Medicare Other | Admitting: Family Medicine

## 2011-07-07 VITALS — BP 106/70 | HR 90

## 2011-07-07 DIAGNOSIS — I2699 Other pulmonary embolism without acute cor pulmonale: Secondary | ICD-10-CM

## 2011-07-07 NOTE — Progress Notes (Signed)
Patient ID: Gabrielle Stafford, female   DOB: Aug 17, 1929, 78 y.o.   MRN: [id number] INR check for coumadin therapy

## 2011-07-14 ENCOUNTER — Telehealth: Payer: Self-pay | Admitting: *Deleted

## 2011-07-14 ENCOUNTER — Ambulatory Visit (INDEPENDENT_AMBULATORY_CARE_PROVIDER_SITE_OTHER): Payer: Medicare Other | Admitting: Physician Assistant

## 2011-07-14 VITALS — BP 126/64 | HR 89

## 2011-07-14 DIAGNOSIS — I2699 Other pulmonary embolism without acute cor pulmonale: Secondary | ICD-10-CM

## 2011-07-14 DIAGNOSIS — R791 Abnormal coagulation profile: Secondary | ICD-10-CM

## 2011-07-14 LAB — PROTIME-INR: INR: 4.11 — ABNORMAL HIGH (ref <1.50)

## 2011-07-14 NOTE — Telephone Encounter (Signed)
Last msg sent to [facility] before labs were received. Changed to skip tomorrow's dose of coumadin and then decrease to Sunday 3mg , Monday 3mg , Tuesday 4mg , Thursday 4mg , Friday 3mg  and recheck on Friday.

## 2011-07-14 NOTE — Telephone Encounter (Signed)
Called pt and notified of instructions below and pt will take 4 mg on wed as well per Jade.pt will come in on Fri to recheck coumadin

## 2011-07-14 NOTE — Patient Instructions (Signed)
Omit Saturday and Sunday dose and recheck INR on Monday in office and we will tell you the dose to resume.

## 2011-07-14 NOTE — Progress Notes (Signed)
  Subjective:    Patient ID: Gabrielle Stafford, female    DOB: 1929/08/13, 78 y.o.   MRN: [id number] INR check. Pt sent for STAT Lab due to results. HPI    Review of Systems     Objective:   Physical Exam        Assessment & Plan:

## 2011-07-21 ENCOUNTER — Ambulatory Visit: Payer: Medicare Other

## 2011-08-01 LAB — LIPID PANEL
Cholesterol: 182 mg/dL (ref 0–200)
HDL: 70 mg/dL (ref 35–70)
LDL Cholesterol: 70 mg/dL
Triglycerides: 211 mg/dL — AB (ref 40–160)

## 2011-08-01 LAB — BASIC METABOLIC PANEL: Glucose: 83 mg/dL

## 2011-08-01 LAB — HEPATIC FUNCTION PANEL
ALT: 15 U/L (ref 7–35)
Alkaline Phosphatase: 107 U/L (ref 25–125)

## 2011-08-08 ENCOUNTER — Encounter: Payer: Self-pay | Admitting: Family Medicine

## 2011-08-08 ENCOUNTER — Ambulatory Visit: Payer: Self-pay | Admitting: Family Medicine

## 2011-08-08 ENCOUNTER — Ambulatory Visit (INDEPENDENT_AMBULATORY_CARE_PROVIDER_SITE_OTHER): Payer: Medicare Other | Admitting: Family Medicine

## 2011-08-08 DIAGNOSIS — I959 Hypotension, unspecified: Secondary | ICD-10-CM

## 2011-08-08 DIAGNOSIS — I2699 Other pulmonary embolism without acute cor pulmonale: Secondary | ICD-10-CM

## 2011-08-08 DIAGNOSIS — R609 Edema, unspecified: Secondary | ICD-10-CM

## 2011-08-08 DIAGNOSIS — R600 Localized edema: Secondary | ICD-10-CM

## 2011-08-08 LAB — BASIC METABOLIC PANEL
BUN: 20 mg/dL (ref 6–23)
CO2: 26 mEq/L (ref 19–32)
Calcium: 9.5 mg/dL (ref 8.4–10.5)
Creat: 1.4 mg/dL — ABNORMAL HIGH (ref 0.50–1.10)
Glucose, Bld: 91 mg/dL (ref 70–99)

## 2011-08-08 NOTE — Progress Notes (Signed)
  Subjective:    Patient ID: Gabrielle Stafford, female    DOB: 04-07-1929, 78 y.o.   MRN: [id number]  HPI She is feeling much better. Says was have low BP episodes about 2 months ago. At that time we thought it was secondary to her Lasix only decreased her dose so that she was not taking it daily.. Says drinks plenty. She said she did some research online and now thinks it was secondary to B12 shots. They have recently increased her injections to once a week because of deficiency and she thinks that was the primary cause for her hypertensive episodes. She says she didn't feel was related to her lasix.  She says recently restart daily lasix dose.  She is now feeling well and has not had any more episodes. No chest pain or shortness of breath. She is due to recheck her BMP.    Also her foru coumadin check. No easy bruising or bleeding. She will be out of town for the next 3 weeks.    Review of Systems     Objective:   Physical Exam  Constitutional: She is oriented to person, place, and time. She appears well-developed and well-nourished.  HENT:  Head: Normocephalic and atraumatic.  Cardiovascular: Normal rate, regular rhythm and normal heart sounds.        Soft SEM best heard at the right sternal border. No carotid bruits.  Pulmonary/Chest: Effort normal and breath sounds normal.  Neurological: She is alert and oriented to person, place, and time.  Skin: Skin is warm and dry.  Psychiatric: She has a normal mood and affect. Her behavior is normal.          Assessment & Plan:  Hypotension - resolved. Recheck BMP since now back on daily lasix. I explained her that we will have to make sure that her BUN and creatinine are normal. There was also part of the reason he also decreased her Lasix but she really says that the swelling is painful and really feels that the daily Lasix is what helps keep her symptoms under better control.  History of PE-see Coumadin flow sheet for adjustments. 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

## 2011-09-05 ENCOUNTER — Ambulatory Visit (INDEPENDENT_AMBULATORY_CARE_PROVIDER_SITE_OTHER): Payer: Medicare Other | Admitting: Family Medicine

## 2011-09-05 VITALS — BP 104/58 | HR 71

## 2011-09-05 DIAGNOSIS — Z86718 Personal history of other venous thrombosis and embolism: Secondary | ICD-10-CM

## 2011-09-05 DIAGNOSIS — Z7901 Long term (current) use of anticoagulants: Secondary | ICD-10-CM

## 2011-09-05 NOTE — Progress Notes (Signed)
  Subjective:    Patient ID: Gabrielle Stafford, female    DOB: 09/05/1929, 78 y.o.   MRN: [id number] INR check. HPI    Review of Systems     Objective:   Physical Exam        Assessment & Plan:   

## 2011-09-06 ENCOUNTER — Telehealth: Payer: Self-pay | Admitting: *Deleted

## 2011-09-27 ENCOUNTER — Telehealth: Payer: Self-pay | Admitting: *Deleted

## 2011-09-27 ENCOUNTER — Ambulatory Visit (INDEPENDENT_AMBULATORY_CARE_PROVIDER_SITE_OTHER): Payer: Medicare Other | Admitting: Physician Assistant

## 2011-09-27 VITALS — BP 107/70 | HR 82

## 2011-09-27 DIAGNOSIS — I2699 Other pulmonary embolism without acute cor pulmonale: Secondary | ICD-10-CM

## 2011-09-27 NOTE — Progress Notes (Signed)
  Subjective:    Patient ID: Gabrielle Stafford, female    DOB: [birth year], 78 y.o.   MRN: [id number]  HPI    Review of Systems     Objective:   Physical Exam        Assessment & Plan:  Decrease Coumadin. Recheck in 1 week. Meron Bocchino PA-C.

## 2011-09-27 NOTE — Telephone Encounter (Signed)
Called and left a detailed message on vm with instructions and to call back to schedule nv for 1 week to recheck inr

## 2011-10-03 ENCOUNTER — Telehealth: Payer: Self-pay | Admitting: Family Medicine

## 2011-10-03 NOTE — Telephone Encounter (Signed)
Call pt: labs from [facility] showed Chol looks good, excpet TG are high. Work on [employer].  Also had some protein in th urine. Receck in one month with urine mciroalbumin and urine microscopic eval.

## 2011-10-04 ENCOUNTER — Telehealth: Payer: Self-pay | Admitting: *Deleted

## 2011-10-04 NOTE — Telephone Encounter (Signed)
Pt states she was taking  4 mg sun,2 mg mon,4mg  tues-wed, 2mg  thurs and Friday, 4 mg sat

## 2011-10-04 NOTE — Telephone Encounter (Addendum)
Called pt to notify her of results  And pt states he coumadin instructions are not correct.Pt states her dose should be decrease. Please advise. At last nurse visit pt stated that she was not taking the coumadin how it was rx'ed because she didn't understand the message and didn't call back.

## 2011-10-04 NOTE — Telephone Encounter (Signed)
Pt notified about results and will schedule nv;pt states her inr instruction that she received last week are not correct and that her dose should be decreased. I'm not sure about dose because it looked like it was decrease from the last time she was here on 6/4.please advise

## 2011-10-04 NOTE — Telephone Encounter (Signed)
Decrease to 2mg  every day except Wednesday and Sunday. Recheck at scheduled visit.

## 2011-10-04 NOTE — Telephone Encounter (Signed)
Pt was called 10/04/11 with instructions.

## 2011-10-09 ENCOUNTER — Encounter: Payer: Self-pay | Admitting: *Deleted

## 2011-10-09 ENCOUNTER — Ambulatory Visit (INDEPENDENT_AMBULATORY_CARE_PROVIDER_SITE_OTHER): Payer: Medicare Other | Admitting: Family Medicine

## 2011-10-09 VITALS — BP 98/64 | HR 79

## 2011-10-09 DIAGNOSIS — Z7901 Long term (current) use of anticoagulants: Secondary | ICD-10-CM

## 2011-10-09 DIAGNOSIS — Z86718 Personal history of other venous thrombosis and embolism: Secondary | ICD-10-CM

## 2011-10-09 LAB — POCT INR: INR: 2

## 2011-10-09 NOTE — Progress Notes (Signed)
  Subjective:    Patient ID: Gabrielle Stafford, female    DOB: 1929/10/07, 78 y.o.   MRN: [id number] INR check HPI    Review of Systems     Objective:   Physical Exam        Assessment & Plan:  Was taking meds differently then reports on the form.

## 2011-10-16 ENCOUNTER — Ambulatory Visit (INDEPENDENT_AMBULATORY_CARE_PROVIDER_SITE_OTHER): Payer: Medicare Other | Admitting: Family Medicine

## 2011-10-16 VITALS — BP 84/60 | HR 74

## 2011-10-16 DIAGNOSIS — I2699 Other pulmonary embolism without acute cor pulmonale: Secondary | ICD-10-CM

## 2011-10-16 NOTE — Progress Notes (Signed)
  Subjective:    Patient ID: Gabrielle Stafford, female    DOB: 12/08/1929, 78 y.o.   MRN: [id number] INR check. HPI    Review of Systems     Objective:   Physical Exam        Assessment & Plan:   

## 2011-10-23 ENCOUNTER — Ambulatory Visit (INDEPENDENT_AMBULATORY_CARE_PROVIDER_SITE_OTHER): Payer: Medicare Other | Admitting: Family Medicine

## 2011-10-23 ENCOUNTER — Ambulatory Visit: Payer: Self-pay | Admitting: Family Medicine

## 2011-10-23 VITALS — BP 86/57 | HR 79

## 2011-10-23 DIAGNOSIS — Z86718 Personal history of other venous thrombosis and embolism: Secondary | ICD-10-CM

## 2011-10-23 LAB — PROTIME-INR: Prothrombin Time: 41.7 seconds — ABNORMAL HIGH (ref 11.6–15.2)

## 2011-10-23 LAB — POCT INR: INR: 5.5

## 2011-10-23 NOTE — Progress Notes (Signed)
  Subjective:    Patient ID: Gabrielle Stafford, female    DOB: 06-24-1929, 78 y.o.   MRN: [id number] INR check. HPI    Review of Systems     Objective:   Physical Exam        Assessment & Plan:

## 2011-10-23 NOTE — Progress Notes (Signed)
Pt unavailable. Son informed.

## 2011-10-30 ENCOUNTER — Telehealth: Payer: Self-pay | Admitting: *Deleted

## 2011-10-30 ENCOUNTER — Other Ambulatory Visit: Payer: Self-pay | Admitting: Family Medicine

## 2011-10-30 ENCOUNTER — Ambulatory Visit (INDEPENDENT_AMBULATORY_CARE_PROVIDER_SITE_OTHER): Payer: Medicare Other | Admitting: Family Medicine

## 2011-10-30 VITALS — BP 98/57 | HR 69

## 2011-10-30 DIAGNOSIS — Z79899 Other long term (current) drug therapy: Secondary | ICD-10-CM

## 2011-10-30 DIAGNOSIS — R899 Unspecified abnormal finding in specimens from other organs, systems and tissues: Secondary | ICD-10-CM

## 2011-10-30 DIAGNOSIS — Z86718 Personal history of other venous thrombosis and embolism: Secondary | ICD-10-CM

## 2011-10-30 LAB — COMPREHENSIVE METABOLIC PANEL
ALT: 18 U/L (ref 0–35)
Alkaline Phosphatase: 96 U/L (ref 39–117)
Sodium: 137 mEq/L (ref 135–145)
Total Bilirubin: 0.3 mg/dL (ref 0.3–1.2)
Total Protein: 6.3 g/dL (ref 6.0–8.3)

## 2011-10-30 LAB — POCT INR: INR: 8

## 2011-10-30 NOTE — Telephone Encounter (Signed)
Pt informed and states she will try to take 4 Klorcon tabs tomorrow. Labs entered.

## 2011-10-30 NOTE — Progress Notes (Signed)
Pt informed

## 2011-10-30 NOTE — Telephone Encounter (Signed)
Pt's PT is 49.8, INR is 5.43 and Potassium is 2.8. Please advise.

## 2011-10-30 NOTE — Telephone Encounter (Signed)
Called pt and make sure take a total of 4 TODAY. I explained to her that we really need to get her back up ASAP or she will need an IV. She has been having frequent diarrhea this week  ( up to 10 x a day) but this is not unusual for her.  Encouraged her to hydrate and make sure to f/u tomorrow for repeat sodium check.  If nto improving will have to go to ED for IV fluids.

## 2011-10-30 NOTE — Progress Notes (Signed)
  Subjective:    Patient ID: Gabrielle Stafford, female    DOB: 02/25/1930, 78 y.o.   MRN: [id number] INR check. Pt taking Coumadin as instructed. No new meds, avoiding green leafy veggies. Pt does c/o easy bruising. 5 minutes spent with patient HPI    Review of Systems     Objective:   Physical Exam        Assessment & Plan:  This patient is normally well controlled but for some reason last couple times and has not been well controlled. We asked her bring in her bottles that we can verify her dosage. In addition we'll get some additional blood work today and call her with results. We also sent her to the lab to confirm her INR since it was greater than 8.0 on her point-of-care testing machine. Nani Gasser, MD

## 2011-10-31 ENCOUNTER — Encounter: Payer: Self-pay | Admitting: Family Medicine

## 2011-10-31 ENCOUNTER — Other Ambulatory Visit: Payer: Self-pay | Admitting: Family Medicine

## 2011-10-31 ENCOUNTER — Ambulatory Visit (INDEPENDENT_AMBULATORY_CARE_PROVIDER_SITE_OTHER): Payer: Medicare Other | Admitting: Family Medicine

## 2011-10-31 VITALS — BP 109/66 | HR 75 | Ht 63.0 in | Wt 131.0 lb

## 2011-10-31 DIAGNOSIS — E871 Hypo-osmolality and hyponatremia: Secondary | ICD-10-CM

## 2011-10-31 DIAGNOSIS — M25473 Effusion, unspecified ankle: Secondary | ICD-10-CM

## 2011-10-31 DIAGNOSIS — I872 Venous insufficiency (chronic) (peripheral): Secondary | ICD-10-CM

## 2011-10-31 DIAGNOSIS — Z7901 Long term (current) use of anticoagulants: Secondary | ICD-10-CM

## 2011-10-31 DIAGNOSIS — R609 Edema, unspecified: Secondary | ICD-10-CM

## 2011-10-31 DIAGNOSIS — I878 Other specified disorders of veins: Secondary | ICD-10-CM

## 2011-10-31 DIAGNOSIS — Z79899 Other long term (current) drug therapy: Secondary | ICD-10-CM

## 2011-10-31 LAB — BASIC METABOLIC PANEL WITH GFR
Chloride: 103 mEq/L (ref 96–112)
GFR, Est African American: 32 mL/min — ABNORMAL LOW
GFR, Est Non African American: 28 mL/min — ABNORMAL LOW
Potassium: 3.9 mEq/L (ref 3.5–5.3)

## 2011-10-31 MED ORDER — FUROSEMIDE 40 MG PO TABS: 20.0000 mg | ORAL_TABLET | Freq: Every day | ORAL | Status: DC

## 2011-10-31 NOTE — Progress Notes (Signed)
  Subjective:    Patient ID: Gabrielle Stafford, female    DOB: 02-15-1930, 78 y.o.   MRN: [id number]  HPI She is here to followup on her blood work today. Her Coumadin has been running very high the last couple of times which is very unusual for her. She met she's had some major dietary changes. She's been eating a lot of cannulated peaches and fresh fruit. Also her irritable bowel syndrome seems to be acting up. She's been having up to 10 frequent stools a day. She sees Dr. Donnie Coffin her gastroenterologist regularly for this. She says she knows that fruit tends to trigger it but she enjoys eating it. We did her blood work and her potassium was low and her BUN/creatinine were elevated. She did discuss the results. She also brought in all her supplements and medications that we can make sure there was nothing that might be interfering with her Coumadin.   Review of Systems     Objective:   Physical Exam  Constitutional: She is oriented to person, place, and time. She appears well-developed and well-nourished.  HENT:  Head: Normocephalic and atraumatic.  Cardiovascular: Normal rate, regular rhythm and normal heart sounds.   Pulmonary/Chest: Effort normal and breath sounds normal.  Musculoskeletal: She exhibits edema.       Trace ankle edema bilaterally.   Neurological: She is alert and oriented to person, place, and time.  Skin: Skin is warm and dry.  Psychiatric: She has a normal mood and affect. Her behavior is normal.          Assessment & Plan:  Medication management-certainly encourage her to decrease her fruit intake since this has been trigger her diarrhea which is most likely cause of her hypokalemia. We also increased her potassium tabs yesterday and today. She has gone to the lab to recheck her blood work. I did explain to her that her potassium was extremely low in certainly puts her at risk for muscle spasms and cardiac arrhythmias.  Lower extremity edema-she really does not want  to stop her Lasix. She says when she doesn't take them her ankles swell greatly and she gets a lot of pain itching and discomfort. She says she has compression stockings because they don't work very well. I discussed with her that we could certainly get a higher compression medical grade stockings for her. She says she has difficulty putting them on because of her hip pain. Thus we'll try decreasing her Lasix to 20 mg and see if maybe this is a better balance for controlling her ankle edema but not drying out her kidneys too much.  Anticoagulation-after going to her medications I don't see anything that should be interfering with her Coumadin correctly. She does take a multivitamin and a couple supplement such as iron and B12 but none of these are new and she has been taking them for a long time. I really think that the swing in her INR is somehow related to the fruit intake. We have adjusted her dose and she will recheck this in one week.   25 min spent face to face with over half the time discussing medication management.   Will send a copy of the blood work over to Dr. Mathis Bud.

## 2011-10-31 NOTE — Patient Instructions (Addendum)
Decrease your lasix to half a tab (20mg ) and let see if works better.  Keep appointment to recheck your coumadin in one week.

## 2011-11-06 ENCOUNTER — Ambulatory Visit (INDEPENDENT_AMBULATORY_CARE_PROVIDER_SITE_OTHER): Payer: Medicare Other | Admitting: Family Medicine

## 2011-11-06 VITALS — BP 112/75 | HR 80

## 2011-11-06 DIAGNOSIS — I2699 Other pulmonary embolism without acute cor pulmonale: Secondary | ICD-10-CM

## 2011-11-06 NOTE — Progress Notes (Signed)
  Subjective:    Patient ID: Gabrielle Stafford, female    DOB: 08/06/1929, 78 y.o.   MRN: [id number] Pt denies any excessive bruising or bleeding. 5 mins spent with pt.  HPI    Review of Systems     Objective:   Physical Exam        Assessment & Plan:

## 2011-11-13 ENCOUNTER — Ambulatory Visit (INDEPENDENT_AMBULATORY_CARE_PROVIDER_SITE_OTHER): Payer: Medicare Other | Admitting: Family Medicine

## 2011-11-13 ENCOUNTER — Other Ambulatory Visit: Payer: Self-pay | Admitting: *Deleted

## 2011-11-13 VITALS — BP 114/72 | HR 78

## 2011-11-13 DIAGNOSIS — I2699 Other pulmonary embolism without acute cor pulmonale: Secondary | ICD-10-CM

## 2011-11-13 MED ORDER — WARFARIN SODIUM 4 MG PO TABS: 4.0000 mg | ORAL_TABLET | Freq: Every day | ORAL | Status: DC

## 2011-11-13 NOTE — Progress Notes (Signed)
  Subjective:    Patient ID: Gabrielle Stafford, female    DOB: 10/06/1929, 78 y.o.   MRN: [id number] Pt denies any excessive bruising or bleeding. 5 mins spent with pt.  HPI    Review of Systems     Objective:   Physical Exam        Assessment & Plan:   

## 2011-11-29 ENCOUNTER — Ambulatory Visit (INDEPENDENT_AMBULATORY_CARE_PROVIDER_SITE_OTHER): Payer: Medicare Other

## 2011-11-29 ENCOUNTER — Ambulatory Visit (INDEPENDENT_AMBULATORY_CARE_PROVIDER_SITE_OTHER): Payer: Medicare Other | Admitting: Sports Medicine

## 2011-11-29 ENCOUNTER — Ambulatory Visit: Payer: Medicare Other

## 2011-11-29 ENCOUNTER — Ambulatory Visit: Payer: Self-pay | Admitting: Sports Medicine

## 2011-11-29 VITALS — BP 118/76 | HR 80 | Resp 16 | Wt 131.0 lb

## 2011-11-29 DIAGNOSIS — Z86718 Personal history of other venous thrombosis and embolism: Secondary | ICD-10-CM

## 2011-11-29 DIAGNOSIS — R0781 Pleurodynia: Secondary | ICD-10-CM

## 2011-11-29 DIAGNOSIS — R0789 Other chest pain: Secondary | ICD-10-CM | POA: Insufficient documentation

## 2011-11-29 DIAGNOSIS — J438 Other emphysema: Secondary | ICD-10-CM

## 2011-11-29 DIAGNOSIS — R079 Chest pain, unspecified: Secondary | ICD-10-CM

## 2011-11-29 LAB — POCT INR: INR: 4.2

## 2011-11-29 NOTE — Assessment & Plan Note (Signed)
Current meds Coumadin, goal 2-3. She is supratherapeutic today without evidence of bleeding. She will hold her Coumadin for 2 days, also have her drop to a half a tab for one additional day per week. She will come back in one week to recheck.

## 2011-11-29 NOTE — Assessment & Plan Note (Signed)
Chest wall pain, at the costochondral junction without evidence of fracture. She is noted good benefit so far with Tylenol arthritis. She will take one tab 3 times a day. I can see her back as needed for this.

## 2011-11-29 NOTE — Progress Notes (Signed)
Patient ID: Gabrielle Stafford, female   DOB: 07-31-1929, 78 y.o.   MRN: [id number] Subjective:    CC: Coumadin check, right rib pain.  HPI: [name] is a pleasant 78 year old female who unfortunately, press hard, pop in her right anterolateral chest. She since then had pain particularly with deep breathing, but no shortness of breath. The pain is localized along the lower ribs in the anterior axillary line. It does not radiate. She has no cough.  Past medical history, Surgical history, Family history, Social history, Allergies, and medications have been entered into the medical record, reviewed, and no changes needed.   Review of Systems: No fevers, chills, night sweats, weight loss, chest pain, or shortness of breath.   Objective:    General: Well Developed, well nourished, and in no acute distress.  Neuro: Alert and oriented x3, extra-ocular muscles intact.  HEENT: Normocephalic, atraumatic, pupils equal round reactive to light, neck supple, no masses, no lymphadenopathy, thyroid nonpalpable.  Skin: Warm and dry, no rashes. Cardiac: Regular rate and rhythm, no murmurs rubs or gallops.  Respiratory: Clear to auscultation bilaterally. Not using accessory muscles, speaking in full sentences. Musculoskeletal: She is tender to palpation along the seventh right-sided costochondral junction. There is no evidence of crepitus, or step-off over her rib.  I did order, and review x-rays: They show no sign of fracture.  Her chest AP shows well expanded lungs. No evidence of pneumothorax, or atelectasis.   Impression and Recommendations:

## 2011-12-06 ENCOUNTER — Ambulatory Visit (INDEPENDENT_AMBULATORY_CARE_PROVIDER_SITE_OTHER): Payer: Medicare Other | Admitting: Family Medicine

## 2011-12-06 ENCOUNTER — Telehealth: Payer: Self-pay | Admitting: *Deleted

## 2011-12-06 VITALS — BP 117/72 | HR 77

## 2011-12-06 DIAGNOSIS — Z7901 Long term (current) use of anticoagulants: Secondary | ICD-10-CM

## 2011-12-06 DIAGNOSIS — Z86711 Personal history of pulmonary embolism: Secondary | ICD-10-CM

## 2011-12-06 NOTE — Progress Notes (Signed)
  Subjective:    Patient ID: Gabrielle Stafford, female    DOB: 02/15/1930, 78 y.o.   MRN: [id number]  HPI   Pt denies any excessive bruising or bleeding. 5 mins spent with pt.pt is taking 2mg  on sun and 4 mg on all other days  Review of Systems     Objective:   Physical Exam        Assessment & Plan:  Supratherapeutic INR, : New regimen of 4mg  on Sunday, Tuesday, Thursday, Saturday, and then 2mg  on Monday, Wednesday, Fridays.   Pt notified of new instructions in the office. Given printout of instructions and pt voiced understanding.Pt to f/u in one week.acm

## 2011-12-14 ENCOUNTER — Ambulatory Visit: Payer: Self-pay | Admitting: Family Medicine

## 2011-12-14 ENCOUNTER — Ambulatory Visit (INDEPENDENT_AMBULATORY_CARE_PROVIDER_SITE_OTHER): Payer: Medicare Other | Admitting: Family Medicine

## 2011-12-14 ENCOUNTER — Encounter: Payer: Self-pay | Admitting: Family Medicine

## 2011-12-14 ENCOUNTER — Telehealth: Payer: Self-pay | Admitting: Family Medicine

## 2011-12-14 VITALS — BP 123/67 | HR 75 | Ht 62.0 in | Wt 130.0 lb

## 2011-12-14 DIAGNOSIS — Z1331 Encounter for screening for depression: Secondary | ICD-10-CM

## 2011-12-14 DIAGNOSIS — Z0000 Encounter for general adult medical examination without abnormal findings: Secondary | ICD-10-CM

## 2011-12-14 DIAGNOSIS — Z7901 Long term (current) use of anticoagulants: Secondary | ICD-10-CM

## 2011-12-14 DIAGNOSIS — H9190 Unspecified hearing loss, unspecified ear: Secondary | ICD-10-CM

## 2011-12-14 DIAGNOSIS — Z23 Encounter for immunization: Secondary | ICD-10-CM

## 2011-12-14 DIAGNOSIS — N184 Chronic kidney disease, stage 4 (severe): Secondary | ICD-10-CM

## 2011-12-14 DIAGNOSIS — Z9181 History of falling: Secondary | ICD-10-CM

## 2011-12-14 NOTE — Progress Notes (Signed)
Subjective:    Gabrielle Stafford is a 78 y.o. female who presents for Medicare Annual/Subsequent preventive examination.  Preventive Screening-Counseling & Management  Tobacco History  Smoking status  . Former Smoker  . Types: Cigarettes  . Quit date: 04/03/1997  Smokeless tobacco  . Not on file     Problems Prior to Visit 1.   Current Problems (verified) Patient Active Problem List  Diagnosis  . B12 DEFICIENCY  . UNSPECIFIED VITAMIN D DEFICIENCY  . HYPERLIPIDEMIA  . HYPOCALCEMIA  . HYPOKALEMIA  . ANEMIA, IRON DEFICIENCY  . CATARACT, RIGHT EYE  . MICROSCOPIC HEMATURIA  . BACK PAIN  . OSTEOPOROSIS  . DEPENDENT EDEMA, LEGS, BILATERAL  . ADENOCARCINOMA, COLON, HX OF  . PULMONARY EMBOLISM, HX OF  . Chronic kidney disease (CKD), stage III (moderate)  . Rib pain on right side    Medications Prior to Visit Current Outpatient Prescriptions on File Prior to Visit  Medication Sig Dispense Refill  . Acetaminophen 650 MG TABS Take by mouth.      Marland Kitchen CALCIUM CITRATE PO Take 500-630 mg by mouth.        . Cholecalciferol (VITAMIN D3) 2000 UNITS TABS Take by mouth daily.        . cyanocobalamin (,VITAMIN B-12,) 1000 MCG/ML injection Inject 1,000 mcg into the muscle every 30 (thirty) days.      . Cyanocobalamin (B-12) 2500 MCG TABS Take by mouth daily.      . furosemide (LASIX) 40 MG tablet Take 0.5 tablets (20 mg total) by mouth daily.  1 tablet  0  . IRON PO Take 45 mg by mouth.       Marland Kitchen KLOR-CON 10 10 MEQ tablet TAKE 2 TABLETS DAILY.  180 each  1  . Multiple Minerals-Vitamins (CALCIUM CITRATE PLUS/MAGNESIUM) TABS Take by mouth.      . Multiple Vitamin (MULTIVITAMIN) tablet Take 1 tablet by mouth daily.        . ranitidine (ZANTAC) 75 MG tablet Take 75 mg by mouth daily.      Marland Kitchen warfarin (COUMADIN) 4 MG tablet Take 1 tablet (4 mg total) by mouth daily.  90 tablet  2    Current Medications (verified) Current Outpatient Prescriptions  Medication Sig Dispense Refill  .  Acetaminophen 650 MG TABS Take by mouth.      Marland Kitchen CALCIUM CITRATE PO Take 500-630 mg by mouth.        . Cholecalciferol (VITAMIN D3) 2000 UNITS TABS Take by mouth daily.        . cyanocobalamin (,VITAMIN B-12,) 1000 MCG/ML injection Inject 1,000 mcg into the muscle every 30 (thirty) days.      . Cyanocobalamin (B-12) 2500 MCG TABS Take by mouth daily.      . furosemide (LASIX) 40 MG tablet Take 0.5 tablets (20 mg total) by mouth daily.  1 tablet  0  . IRON PO Take 45 mg by mouth.       Marland Kitchen KLOR-CON 10 10 MEQ tablet TAKE 2 TABLETS DAILY.  180 each  1  . Multiple Minerals-Vitamins (CALCIUM CITRATE PLUS/MAGNESIUM) TABS Take by mouth.      . Multiple Vitamin (MULTIVITAMIN) tablet Take 1 tablet by mouth daily.        . ranitidine (ZANTAC) 75 MG tablet Take 75 mg by mouth daily.      Marland Kitchen warfarin (COUMADIN) 4 MG tablet Take 1 tablet (4 mg total) by mouth daily.  90 tablet  2     Allergies (verified) Alendronate sodium;  Azithromycin; Ciprofloxacin; Nitrofurantoin; Sulfamethoxazole w-trimethoprim; and Tramadol hcl   PAST HISTORY  Family History Family History  Problem Relation Age of Onset  . Alcohol abuse Father   . Gout Father   . Heart attack Father     brothers  . Diabetes Father   . Hypertension Father   . Cancer Father     bladder tumor  . Colon cancer Mother     colon  . Pulmonary embolism Mother     leg amputation  . Depression Mother   . Hyperlipidemia Mother   . Diabetes Sister   . Breast cancer Sister   . Arthritis      aunt    Social History History  Substance Use Topics  . Smoking status: Former Smoker    Types: Cigarettes    Quit date: 04/03/1997  . Smokeless tobacco: Not on file  . Alcohol Use: No     Are there smokers in your home (other than you)? No  Risk Factors Current exercise habits: Silver sneakers at the [facility] twice a week Dietary issues discussed: none    Cardiac risk factors: advanced age (older than 24 for men, 67 for women).  Depression  Screen (Note: if answer to either of the following is "Yes", a more complete depression screening is indicated)   Over the past two weeks, have you felt down, depressed or hopeless? No  Over the past two weeks, have you felt little interest or pleasure in doing things? Yes  Have you lost interest or pleasure in daily life? No  Do you often feel hopeless? No  Do you cry easily over simple problems? No  Activities of Daily Living In your present state of health, do you have any difficulty performing the following activities?:  Driving? No Managing money?  No Feeding yourself? No Getting from bed to chair? No Climbing a flight of stairs? Yes Preparing food and eating?: No Bathing or showering? Yes Getting dressed: No Getting to the toilet? No Using the toilet:No Moving around from place to place: No In the past year have you fallen or had a near fall?:No   Are you sexually active?  No  Do you have more than one partner?  No  Hearing Difficulties: Yes Do you often ask people to speak up or repeat themselves? No Do you experience ringing or noises in your ears? Yes Do you have difficulty understanding soft or whispered voices? No   Do you feel that you have a problem with memory? Yes  Do you often misplace items? Yes  Do you feel safe at home?  Yes  Cognitive Testing  Alert? Yes  Normal Appearance?Yes  Oriented to person? Yes  Place? Yes   Time? Yes  Recall of three objects?  Yes  Can perform simple calculations? Yes  Displays appropriate judgment?Yes  Can read the correct time from a watch face?Yes   Advanced Directives have been discussed with the patient? Yes  List the Names of Other Physician/Practitioners you currently use: 1.    Indicate any recent Medical Services you may have received from other than [facility] providers in the past year (date may be approximate).  Immunization History  Administered Date(s) Administered  . H1N1 03/04/2008  . Influenza Split  12/16/2010  . Influenza Whole 12/23/2009  . Pneumococcal Polysaccharide 04/03/1998  . Tdap 09/22/2010  . Zoster 02/04/2010    Screening Tests Health Maintenance  Topic Date Due  . Colonoscopy  02/11/2010  . Mammogram  10/03/2011  . Influenza  Vaccine  01/02/2012  . Tetanus/tdap  09/21/2020  . Pneumococcal Polysaccharide Vaccine Age 93 And Over  Completed  . Zostavax  Completed    All answers were reviewed with the patient and necessary referrals were made:  Calistro Rauf, MD   12/14/2011   History reviewed: allergies, current medications, past family history, past medical history, past social history, past surgical history and problem list  Review of Systems A comprehensive review of systems was negative.    Objective:     Vision by Snellen chart: right eye:20/40, left eye:20/20 w/ glasses  There is no height on file to calculate BMI. BP 123/67  Pulse 75  Wt 130 lb (58.968 kg)  BP 123/67  Pulse 75  Ht 5\' 2"  (1.575 m)  Wt 130 lb (58.968 kg)  BMI 23.78 kg/m2  General Appearance:    Alert, cooperative, no distress, appears stated age  Head:    Normocephalic, without obvious abnormality, atraumatic  Eyes:    PERRL, conjunctiva/corneas clear, EOM's intact, benign, both eyes  Ears:    Normal TM's and external ear canals, both ears  Nose:   Nares normal, septum midline, mucosa normal, no drainage    or sinus tenderness  Throat:   Lips, mucosa, and tongue normal; teeth and gums normal  Neck:   Supple, symmetrical, trachea midline, no adenopathy;    thyroid:  no enlargement/tenderness/nodules; no carotid   bruit or JVD  Back:     Symmetric, no curvature, ROM normal, no CVA tenderness  Lungs:     Clear to auscultation bilaterally, respirations unlabored  Chest Wall:    No tenderness or deformity   Heart:    Regular rate and rhythm, S1 and S2 normal, no murmur, rub   or gallop. No carotid bruits.   Breast Exam:    No tenderness, masses, or nipple abnormality  Abdomen:      Soft, non-tender, bowel sounds active all four quadrants,    no masses, no organomegaly  Genitalia:    Not performed.  Rectal:    Not performed.   Extremities:   Extremities normal, atraumatic, no cyanosis or edema  Pulses:   2+ and symmetric all extremities  Skin:   Skin color, texture, turgor normal, no rashes or lesions  Lymph nodes:   Cervical, supraclavicular, and axillary nodes normal  Neurologic:   CNII-XII intact, gross motor intact       Assessment:     Annual Wellness Exam     Plan:     During the course of the visit the patient was educated and counseled about appropriate screening and preventive services including:    Influenza vaccine  Screening mammography  Refer to nephrology for CKD 4. PTH was normal back in April.  Lower extremity edema-she wants to go back up onto the full 40 mg daily.  Her that she cannot because of her kidney function. She can take a whole maybe once or twice a week to help buttress the day she really needs to stay with a half a tab. Also discussed the importance of low-salt diet and she was given a handout today. Also again I repeated to her that she really needs to consider wearing compression stockings. She's very resistant to this idea.  Form completed for sinsurance.   Procedures hearing loss-she could hear everything at 40 dB which is adequate for her age. No referral needed for cardiology at this point time.  Diet review for nutrition referral? Yes ____  Not Indicated _x___  Patient Instructions (the written plan) was given to the patient.  Medicare Attestation I have personally reviewed: The patient's medical and social history Their use of alcohol, tobacco or illicit drugs Their current medications and supplements The patient's functional ability including ADLs,fall risks, home safety risks, cognitive, and hearing and visual impairment Diet and physical activities Evidence for depression or mood disorders  The  patient's weight, height, BMI, and visual acuity have been recorded in the chart.  I have made referrals, counseling, and provided education to the patient based on review of the above and I have provided the patient with a written personalized care plan for preventive services.     Derrin Currey, MD   12/14/2011

## 2011-12-14 NOTE — Telephone Encounter (Signed)
Patient walked in advised that she needs a refill Furosemide Tablet 40mg  and would like to know if she can have 60 tablets instead of 40 tablets. Thanks

## 2011-12-14 NOTE — Telephone Encounter (Signed)
I am confused. she is supposed ot be taking half a tab of the 40mg  and can take a whole tab 1-2 times a week.  So 40 tabs should plenty for a month. Unless she is needing a 90 days supply.

## 2011-12-14 NOTE — Patient Instructions (Addendum)

## 2011-12-15 ENCOUNTER — Other Ambulatory Visit: Payer: Self-pay | Admitting: *Deleted

## 2011-12-15 LAB — BASIC METABOLIC PANEL WITH GFR
BUN: 25 mg/dL — ABNORMAL HIGH (ref 6–23)
CO2: 23 mEq/L (ref 19–32)
Chloride: 103 mEq/L (ref 96–112)
GFR, Est African American: 32 mL/min — ABNORMAL LOW
Glucose, Bld: 85 mg/dL (ref 70–99)
Potassium: 3.1 mEq/L — ABNORMAL LOW (ref 3.5–5.3)
Sodium: 142 mEq/L (ref 135–145)

## 2011-12-15 MED ORDER — FUROSEMIDE 40 MG PO TABS: 20.0000 mg | ORAL_TABLET | Freq: Every day | ORAL | Status: DC

## 2011-12-15 NOTE — Telephone Encounter (Signed)
Pt states she wanted 60 tabs b/c she is going out of town for a while and needed enough to get her through. States she went to pharmacy and she had 1 refill left and they filled it. I sent more refills to pharmacy with 90 day supply per pt's request.

## 2011-12-21 ENCOUNTER — Ambulatory Visit (INDEPENDENT_AMBULATORY_CARE_PROVIDER_SITE_OTHER): Payer: Medicare Other | Admitting: Family Medicine

## 2011-12-21 ENCOUNTER — Telehealth: Payer: Self-pay | Admitting: *Deleted

## 2011-12-21 VITALS — BP 124/68 | HR 83

## 2011-12-21 DIAGNOSIS — Z7901 Long term (current) use of anticoagulants: Secondary | ICD-10-CM

## 2011-12-21 DIAGNOSIS — Z86711 Personal history of pulmonary embolism: Secondary | ICD-10-CM

## 2011-12-21 LAB — POCT INR: INR: 3.6

## 2011-12-21 NOTE — Telephone Encounter (Signed)
Left message on vm with pt's coumadin instructions,pt states she will be out of town for a month as she is leaving for the beach next week

## 2011-12-21 NOTE — Progress Notes (Signed)
  Subjective:    Patient ID: Gabrielle Stafford, female    DOB: 06/07/1929, 78 y.o.   MRN: [id number]  HPI  Pt does have bruising on dorsal left hand and left arm. Pt denies  bleeding. 5 mins spent with pt.   Review of Systems     Objective:   Physical Exam        Assessment & Plan:

## 2011-12-28 ENCOUNTER — Ambulatory Visit (INDEPENDENT_AMBULATORY_CARE_PROVIDER_SITE_OTHER): Payer: Medicare Other | Admitting: Family Medicine

## 2011-12-28 VITALS — BP 114/65 | HR 86

## 2011-12-28 DIAGNOSIS — I4891 Unspecified atrial fibrillation: Secondary | ICD-10-CM

## 2011-12-28 LAB — POCT INR: INR: 2.2

## 2012-01-12 LAB — HEPATIC FUNCTION PANEL
ALT: 14 U/L (ref 7–35)
AST: 16 U/L (ref 13–35)

## 2012-01-12 LAB — CALCIUM: Calcium: 6.3 mg/dL

## 2012-01-12 LAB — BASIC METABOLIC PANEL
BUN: 17 mg/dL (ref 4–21)
Glucose: 88 mg/dL
Potassium: 3.1 mmol/L — AB (ref 3.4–5.3)
Sodium: 143 mmol/L (ref 137–147)

## 2012-02-02 ENCOUNTER — Encounter: Payer: Self-pay | Admitting: Physician Assistant

## 2012-02-02 ENCOUNTER — Telehealth: Payer: Self-pay | Admitting: *Deleted

## 2012-02-02 ENCOUNTER — Ambulatory Visit (INDEPENDENT_AMBULATORY_CARE_PROVIDER_SITE_OTHER): Payer: Medicare Other | Admitting: Physician Assistant

## 2012-02-02 ENCOUNTER — Ambulatory Visit: Payer: Self-pay | Admitting: Family Medicine

## 2012-02-02 VITALS — BP 106/72 | HR 79 | Temp 98.5°F | Ht 62.0 in | Wt 129.0 lb

## 2012-02-02 DIAGNOSIS — R7989 Other specified abnormal findings of blood chemistry: Secondary | ICD-10-CM

## 2012-02-02 DIAGNOSIS — I82409 Acute embolism and thrombosis of unspecified deep veins of unspecified lower extremity: Secondary | ICD-10-CM

## 2012-02-02 DIAGNOSIS — E876 Hypokalemia: Secondary | ICD-10-CM

## 2012-02-02 DIAGNOSIS — R799 Abnormal finding of blood chemistry, unspecified: Secondary | ICD-10-CM

## 2012-02-02 DIAGNOSIS — R748 Abnormal levels of other serum enzymes: Secondary | ICD-10-CM

## 2012-02-02 DIAGNOSIS — N183 Chronic kidney disease, stage 3 unspecified: Secondary | ICD-10-CM

## 2012-02-02 LAB — POCT INR: INR: 1.5

## 2012-02-02 NOTE — Patient Instructions (Addendum)
Will change to 4mg  on Wednesday and Saturday and 1/2 tab the other days. Recheck in 1 week.   Only take Lasix as needed.   Will call with lab results.

## 2012-02-02 NOTE — Progress Notes (Signed)
  Subjective:    Patient ID: Gabrielle Stafford, female    DOB: Apr 28, 1929, 78 y.o.   MRN: [id number]  HPI Patient presents to the clinic to follow up on INR levels and recent UC visit in [place]. Patient brings in labs from UC. Her Cr, alk phos were elevated her potassium and calcium were low. EKG was normal. CBC was normal. Patient had a lot of diarrhea and some numbness and tingling in both arms and legs. They diagnosed her with dehydration. She has been drinking a lot of fluids and she does feel better today and denies any numbness and tingling. She has continual diarrhea status post colon resection from colon cancer. She has worked with GI and not been able to find a solution. She has ongoing history of elevated serum creatine. She has appt with nephrology in December. She was told to increase potassium for a few days. She is now back to 2 tabs daily.  Coumadin been taking 1/2 tab everyday except Saturday. Admits to not taking last nights dose due to forgetting.    Review of Systems     Objective:   Physical Exam  Constitutional: She is oriented to person, place, and time. She appears well-developed and well-nourished.  HENT:  Head: Normocephalic and atraumatic.  Neck: Normal range of motion. Neck supple. No thyromegaly present.  Cardiovascular: Normal rate, regular rhythm and normal heart sounds.   Pulmonary/Chest: Effort normal and breath sounds normal.  Neurological: She is alert and oriented to person, place, and time.  Skin: Skin is warm and dry.       Good skin tugor. Capillary refill less than 3 sec.   Psychiatric: She has a normal mood and affect. Her behavior is normal.          Assessment & Plan:  Hypokalemia, elevated serum creatine, Stage III chronic kidney disease, hypocalcemia, elevated Alk phos- order CmP, PTH to evaluate for parathyroid problems and to make sure everything else is stable. Encouraged her to only use lasix as needed. Elevated feet as much as can  and wear compression stockings. With calcium being out of whack to want to make sure no parathyroid issues.  DVT- Increased dose to 4mg  Wednesday and Saturday and 1/2 tab all other days. Pt admits to not taking last nights dose. Recheck in 1 week.

## 2012-02-02 NOTE — Telephone Encounter (Signed)
Spoke with pt's husabnd who wrote down INR and coumadin instructions

## 2012-02-03 LAB — COMPLETE METABOLIC PANEL WITH GFR
ALT: 15 U/L (ref 0–35)
AST: 17 U/L (ref 0–37)
Albumin: 3.6 g/dL (ref 3.5–5.2)
Alkaline Phosphatase: 124 U/L — ABNORMAL HIGH (ref 39–117)
BUN: 16 mg/dL (ref 6–23)
Calcium: 6.9 mg/dL — ABNORMAL LOW (ref 8.4–10.5)
Chloride: 108 mEq/L (ref 96–112)
Potassium: 3.1 mEq/L — ABNORMAL LOW (ref 3.5–5.3)

## 2012-02-07 ENCOUNTER — Encounter: Payer: Self-pay | Admitting: *Deleted

## 2012-02-09 ENCOUNTER — Ambulatory Visit (INDEPENDENT_AMBULATORY_CARE_PROVIDER_SITE_OTHER): Payer: Medicare Other | Admitting: Family Medicine

## 2012-02-09 VITALS — BP 83/54 | HR 77

## 2012-02-09 DIAGNOSIS — Z86718 Personal history of other venous thrombosis and embolism: Secondary | ICD-10-CM

## 2012-02-09 DIAGNOSIS — Z7901 Long term (current) use of anticoagulants: Secondary | ICD-10-CM

## 2012-02-09 LAB — POCT INR: INR: 3.4

## 2012-02-09 NOTE — Progress Notes (Signed)
  Subjective:    Patient ID: Gabrielle Stafford, female    DOB: Aug 12, 1929, 78 y.o.   MRN: [id number] Pt denies any excessive bruising or bleeding. 5 mins spent with pt. HPI    Review of Systems     Objective:   Physical Exam        Assessment & Plan:  Hold lasix for 2-3 days and make sure BP comes up over 105.  She is already taking half a tab.

## 2012-02-16 ENCOUNTER — Ambulatory Visit (INDEPENDENT_AMBULATORY_CARE_PROVIDER_SITE_OTHER): Payer: Medicare Other | Admitting: Family Medicine

## 2012-02-16 VITALS — BP 112/65 | HR 70

## 2012-02-16 DIAGNOSIS — Z86718 Personal history of other venous thrombosis and embolism: Secondary | ICD-10-CM

## 2012-02-16 DIAGNOSIS — Z7901 Long term (current) use of anticoagulants: Secondary | ICD-10-CM

## 2012-02-23 ENCOUNTER — Ambulatory Visit: Payer: Medicare Other

## 2012-02-28 ENCOUNTER — Ambulatory Visit (INDEPENDENT_AMBULATORY_CARE_PROVIDER_SITE_OTHER): Payer: Medicare Other | Admitting: Family Medicine

## 2012-02-28 DIAGNOSIS — I2699 Other pulmonary embolism without acute cor pulmonale: Secondary | ICD-10-CM

## 2012-03-07 ENCOUNTER — Ambulatory Visit (INDEPENDENT_AMBULATORY_CARE_PROVIDER_SITE_OTHER): Payer: Medicare Other | Admitting: Family Medicine

## 2012-03-07 ENCOUNTER — Encounter: Payer: Self-pay | Admitting: Family Medicine

## 2012-03-07 ENCOUNTER — Telehealth: Payer: Self-pay | Admitting: *Deleted

## 2012-03-07 ENCOUNTER — Ambulatory Visit: Payer: Self-pay | Admitting: Family Medicine

## 2012-03-07 VITALS — BP 109/68 | HR 83 | Ht 62.0 in | Wt 127.0 lb

## 2012-03-07 DIAGNOSIS — L738 Other specified follicular disorders: Secondary | ICD-10-CM

## 2012-03-07 DIAGNOSIS — R252 Cramp and spasm: Secondary | ICD-10-CM

## 2012-03-07 DIAGNOSIS — Z86711 Personal history of pulmonary embolism: Secondary | ICD-10-CM

## 2012-03-07 DIAGNOSIS — G5793 Unspecified mononeuropathy of bilateral lower limbs: Secondary | ICD-10-CM

## 2012-03-07 DIAGNOSIS — R899 Unspecified abnormal finding in specimens from other organs, systems and tissues: Secondary | ICD-10-CM

## 2012-03-07 DIAGNOSIS — G5790 Unspecified mononeuropathy of unspecified lower limb: Secondary | ICD-10-CM

## 2012-03-07 DIAGNOSIS — E860 Dehydration: Secondary | ICD-10-CM

## 2012-03-07 DIAGNOSIS — L853 Xerosis cutis: Secondary | ICD-10-CM

## 2012-03-07 DIAGNOSIS — R600 Localized edema: Secondary | ICD-10-CM

## 2012-03-07 DIAGNOSIS — R5383 Other fatigue: Secondary | ICD-10-CM

## 2012-03-07 DIAGNOSIS — R609 Edema, unspecified: Secondary | ICD-10-CM

## 2012-03-07 NOTE — Progress Notes (Signed)
  Subjective:    Patient ID: Gabrielle Stafford, female    DOB: 12-17-1929, 78 y.o.   MRN: [id number]  HPI  Having tingling and cramps in her feet. Says when her ankle are really swells it is painful.  Still taking half a fluid pill. We had decreased her dose because she had become dehydrated. Says not necessarily eating a low salt diet.  Says has felt really fatigued since she got dehydrated at the [facility] a few weeks ago.  No GI sxs. No SOB.  Has been sleeping more.  She does wear compression stockings but they're very light grade, she does smoke the counter. No fever or respiratory symptoms. She says she just doesn't feel well. No worsening or alleviating symptoms.  She's also here to get her Coumadin check today. No easy bleeding. She does have easy bruising but this is unchanged.  Review of Systems She complains that her skin is warm dry.    Objective:   Physical Exam  Constitutional: She is oriented to person, place, and time. She appears well-developed and well-nourished.  HENT:  Head: Normocephalic and atraumatic.  Eyes: Conjunctivae normal are normal. Pupils are equal, round, and reactive to light.  Neck: Neck supple. No thyromegaly present.  Cardiovascular: Normal rate, regular rhythm and normal heart sounds.   Pulmonary/Chest: Effort normal and breath sounds normal. No respiratory distress.  Musculoskeletal: She exhibits edema.       Ankle edema is 1+ bilaterally  Lymphadenopathy:    She has no cervical adenopathy.  Neurological: She is alert and oriented to person, place, and time.  Skin: Skin is warm and dry.  Psychiatric: She has a normal mood and affect. Her behavior is normal.          Assessment & Plan:  Fatigue  - unclear etiology. We will check a CBC to rule out anemia. We'll also check for electrolyte disturbance. Also check her thyroid level.  Mild dehydration.- Will recheck BUN/creatinine today to see if she is dehydrated. See no signs of volume overload. I  explained her again at the balance between helping improve the fluid on her ankles but not dehydrating her body or her kidneys too much.  Foot cramps-will check potassium level.  Neuropathy-she's having burning and tingling in both feet. Will check B12 levels, CBC ferritin.  History of pulmonary embolism-see anticoagulation flowsheet for changes.  Bilateral ankle swelling-likely from venous stasis. She really needs to wear more moderate grade compression stockings. Unfortunately she has difficulty getting the zone.

## 2012-03-08 ENCOUNTER — Other Ambulatory Visit: Payer: Self-pay | Admitting: Family Medicine

## 2012-03-08 LAB — URINALYSIS, ROUTINE W REFLEX MICROSCOPIC
Glucose, UA: NEGATIVE mg/dL
Protein, ur: 100 mg/dL — AB
Specific Gravity, Urine: 1.017 (ref 1.005–1.030)

## 2012-03-08 LAB — COMPLETE METABOLIC PANEL WITH GFR
BUN: 19 mg/dL (ref 6–23)
CO2: 22 mEq/L (ref 19–32)
Creat: 1.49 mg/dL — ABNORMAL HIGH (ref 0.50–1.10)
GFR, Est African American: 38 mL/min — ABNORMAL LOW
GFR, Est Non African American: 33 mL/min — ABNORMAL LOW
Glucose, Bld: 78 mg/dL (ref 70–99)
Total Bilirubin: 0.5 mg/dL (ref 0.3–1.2)

## 2012-03-08 LAB — FERRITIN: Ferritin: 62 ng/mL (ref 10–291)

## 2012-03-08 LAB — URINALYSIS, MICROSCOPIC ONLY: WBC, UA: >50 WBC/hpf — AB (ref <3)

## 2012-03-08 LAB — VITAMIN B12: Vitamin B-12: >2000 pg/mL — ABNORMAL HIGH (ref 211–911)

## 2012-03-08 MED ORDER — CEPHALEXIN 500 MG PO CAPS: 500.0000 mg | ORAL_CAPSULE | Freq: Three times a day (TID) | ORAL | Status: DC

## 2012-03-08 NOTE — Telephone Encounter (Signed)
See other note

## 2012-03-08 NOTE — Addendum Note (Signed)
Addended by: Ellsworth Lennox on: 03/08/2012 11:54 AM   Modules accepted: Orders

## 2012-03-09 LAB — PHOSPHORUS: Phosphorus: 4.1 mg/dL (ref 2.3–4.6)

## 2012-03-10 LAB — VITAMIN B1: Vitamin B1 (Thiamine): 14 nmol/L (ref 8–30)

## 2012-03-11 ENCOUNTER — Other Ambulatory Visit: Payer: Self-pay | Admitting: Physician Assistant

## 2012-03-11 ENCOUNTER — Telehealth: Payer: Self-pay | Admitting: *Deleted

## 2012-03-11 LAB — MAGNESIUM: Magnesium: 0.7 mg/dL — ABNORMAL LOW (ref 1.5–2.5)

## 2012-03-11 MED ORDER — MAGNESIUM GLUCONATE 500 MG PO TABS: 1000.0000 mg | ORAL_TABLET | Freq: Two times a day (BID) | ORAL | Status: DC

## 2012-03-11 NOTE — Telephone Encounter (Signed)
Sent magnesium into pharmacy.

## 2012-03-11 NOTE — Telephone Encounter (Signed)
Critical lab value for Magnesium low at 0.7.

## 2012-03-12 LAB — BASIC METABOLIC PANEL WITH GFR
BUN: 19 mg/dL (ref 6–23)
Creat: 1.75 mg/dL — ABNORMAL HIGH (ref 0.50–1.10)
GFR, Est African American: 31 mL/min — ABNORMAL LOW
Glucose, Bld: 98 mg/dL (ref 70–99)
Potassium: 2.6 mEq/L — CL (ref 3.5–5.3)

## 2012-03-12 LAB — ALBUMIN: Albumin: 4.1 g/dL (ref 3.5–5.2)

## 2012-03-13 LAB — PTH, INTACT AND CALCIUM: PTH: 57.8 pg/mL (ref 14.0–72.0)

## 2012-03-13 LAB — VITAMIN D 25 HYDROXY (VIT D DEFICIENCY, FRACTURES): Vit D, 25-Hydroxy: 53 ng/mL (ref 30–89)

## 2012-03-19 ENCOUNTER — Encounter: Payer: Self-pay | Admitting: Family Medicine

## 2012-03-19 ENCOUNTER — Ambulatory Visit (INDEPENDENT_AMBULATORY_CARE_PROVIDER_SITE_OTHER): Payer: Medicare Other | Admitting: Family Medicine

## 2012-03-19 VITALS — BP 101/66 | HR 79 | Wt 129.0 lb

## 2012-03-19 DIAGNOSIS — T887XXA Unspecified adverse effect of drug or medicament, initial encounter: Secondary | ICD-10-CM

## 2012-03-19 DIAGNOSIS — E876 Hypokalemia: Secondary | ICD-10-CM

## 2012-03-19 DIAGNOSIS — N390 Urinary tract infection, site not specified: Secondary | ICD-10-CM

## 2012-03-19 DIAGNOSIS — K90829 Short bowel syndrome, unspecified: Secondary | ICD-10-CM | POA: Insufficient documentation

## 2012-03-19 DIAGNOSIS — K912 Postsurgical malabsorption, not elsewhere classified: Secondary | ICD-10-CM | POA: Insufficient documentation

## 2012-03-19 DIAGNOSIS — T50905A Adverse effect of unspecified drugs, medicaments and biological substances, initial encounter: Secondary | ICD-10-CM

## 2012-03-19 MED ORDER — POTASSIUM CHLORIDE ER 10 MEQ PO TBCR: 20.0000 meq | EXTENDED_RELEASE_TABLET | Freq: Two times a day (BID) | ORAL | Status: DC

## 2012-03-19 NOTE — Progress Notes (Signed)
  Subjective:    Patient ID: Gabrielle Stafford, female    DOB: Sep 01, 1929, 78 y.o.   MRN: [id number]  HPI Says took about 4 days of keflex nad says she hurst to bad in her hips, joints, and muscles.  Also caused itching.  Stopped the medication yesterday after going to the pharmacy.  She is now taking cranberry. She feels her symptoms as far as her bladder is concerned have improved. She says she is taking the cranberry tabs in the past and that they worked well for her.  He was recently discovered that she was very hypocalcemic. Certainly does explain a lot of her symptoms including her fatigue, muscle cramping and tingling that she was experiencing. She says she did try by some over-the-counter calcium with magnesium but says they were too large to swallow. She did pick up the prescription for the magnesium as we did find that she had low levels which is most likely the primary cause of her hypocalcemia.  Hypokalemia-she's now taking 4 potassium tablets daily. She prefers to stay on the brand she's on because they are smaller and she can swallow them without significant difficulty. She does need a new prescription.  Review of Systems     Objective:   Physical Exam  Constitutional: She is oriented to person, place, and time. She appears well-developed and well-nourished.  HENT:  Head: Normocephalic and atraumatic.  Cardiovascular: Normal rate, regular rhythm and normal heart sounds.   Pulmonary/Chest: Effort normal and breath sounds normal.  Neurological: She is alert and oriented to person, place, and time.  Skin: Skin is warm and dry.  Psychiatric: She has a normal mood and affect. Her behavior is normal.          Assessment & Plan:  UTI - REcheck Urine to see infection is cleared in about one week. She did get about 4 days of the Keflex and so she may theoretically be completely treated. Certainly she can continue the cranberry if she would like. Though Her that some studies show  that may help with prevention but does not treat or cure a urinary tract infection.  hypoMagnesium-will recheck in one week as well. She did pick up the prescription and has been taking it regularly.  Hypocalcemia-lets recheck level in about a week and half. Hopefully this will, but she is able to take the magnesium. I did encourage her to try some over-the-counter calcium supplements as well. Unfortunately she has short gut syndrome so this makes it very difficult for her to absorb nutrients appropriately.  Hypokalemia-secondary to her chronic diarrhea. So far she's doing well on the 4 tablets of potassium. We will recheck her potassium level in one week as well.  Drug allergy to Keflex-I. did add this to her allergy list. I explained her that the muscle and joint soreness should get better each day. Within a week she should go back to her baseline.  Elevated alkaline phosphatase. I would like to recheck this on her repeat lab work as well.

## 2012-03-21 ENCOUNTER — Ambulatory Visit: Payer: Medicare Other | Admitting: Family Medicine

## 2012-04-09 ENCOUNTER — Ambulatory Visit: Payer: Self-pay | Admitting: Family Medicine

## 2012-04-09 ENCOUNTER — Ambulatory Visit (INDEPENDENT_AMBULATORY_CARE_PROVIDER_SITE_OTHER): Payer: Medicare Other | Admitting: Family Medicine

## 2012-04-09 ENCOUNTER — Encounter: Payer: Self-pay | Admitting: Family Medicine

## 2012-04-09 VITALS — BP 119/60 | HR 76 | Resp 18 | Wt 125.0 lb

## 2012-04-09 DIAGNOSIS — K529 Noninfective gastroenteritis and colitis, unspecified: Secondary | ICD-10-CM

## 2012-04-09 DIAGNOSIS — N183 Chronic kidney disease, stage 3 unspecified: Secondary | ICD-10-CM

## 2012-04-09 DIAGNOSIS — R790 Abnormal level of blood mineral: Secondary | ICD-10-CM

## 2012-04-09 DIAGNOSIS — R197 Diarrhea, unspecified: Secondary | ICD-10-CM

## 2012-04-09 DIAGNOSIS — I2699 Other pulmonary embolism without acute cor pulmonale: Secondary | ICD-10-CM

## 2012-04-09 DIAGNOSIS — D510 Vitamin B12 deficiency anemia due to intrinsic factor deficiency: Secondary | ICD-10-CM

## 2012-04-09 NOTE — Progress Notes (Signed)
  Subjective:    Patient ID: Gabrielle Stafford, female    DOB: [birth year], 79 y.o.   MRN: [id number]  HPI Has been having sciatica on the left side.  Has been doing some exercises, using hot pad, and has been using her walker.  It is better but not completely better. Worse with walking.  Not on steroids.  She put on her IV potassium.  Saw Dr. Abbe Amsterdam who drew blood work a couple of weeks ago.  Says was given a b12 shot  Her B12 level was high on 12/5 but she thinks she had her injection the week before that might explain why the levels were high.    Low magnesium - she picked up a prescription for supplement about 2 weeks ago. She was upset because she bought some over-the-counter and the next day went to the pharmacy and picked up the prescription that we have sent.  Chronic diarrhea -she plans on seein GI soon    CKD - She has seen nephrology. They had done an [name]. They had encouraged her to stay hydrated and felt her CKD was multifactroial.   Review of Systems     Objective:   Physical Exam  Constitutional: She is oriented to person, place, and time. She appears well-developed and well-nourished.  HENT:  Head: Normocephalic and atraumatic.  Cardiovascular: Normal rate, regular rhythm and normal heart sounds.   Pulmonary/Chest: Effort normal and breath sounds normal.  Neurological: She is alert and oriented to person, place, and time.  Skin: Skin is warm and dry.  Psychiatric: She has a normal mood and affect. Her behavior is normal.          Assessment & Plan:  chronic diarrhea - plans on seeing GI for this. I encouraged her to do so in the past. But it turns out she is actually only seen her new GI doctor once since her old doctor retired. She's had chronic diarrhea since she had her colon surgery. She does have a history of colon cancer. A partially her diarrhea is so persistent that she is now developing nutritional deficiencies including potassium, B12 and magnesium.she plans  on seeing a nutritionist.    CKD - 3-she is seen nephrology. We did get a copy of the office visit note but I do not have a copy of the ultrasound. We will call to get a copy of this.  Hypomagnsium - she did start the over-the-counter supplement about 2 weeks ago. I encouraged her to take it for at least one more week before she does have blood work drawn. I gave her the lab slip today. I have given her a lab slip at her last followup visit but she did not have it drawn. Her history is a little bit confusing today  Hx of PE - see anticoagulation flowsheet.  B12 deficiency-I. did recommend she have her B12 level checked before her next injection. I think that the one that was drawn in December may have been after an injection. We are not 100% clear if she had injection before after but I do want to make sure that she's not supratherapeutic. Though this is unlikely.

## 2012-04-10 ENCOUNTER — Ambulatory Visit: Payer: Medicare Other | Admitting: Family Medicine

## 2012-06-04 ENCOUNTER — Ambulatory Visit: Payer: Medicare Other

## 2012-06-11 ENCOUNTER — Telehealth: Payer: Self-pay | Admitting: *Deleted

## 2012-06-11 NOTE — Telephone Encounter (Signed)
Pt called and lvm wanting advice about stopping her warfarin 3 days prior to having her colonoscopy done and this will be fine. And there were not orders from Dr. Charlies Silvers.Laureen Ochs, Viann Shove

## 2012-06-12 ENCOUNTER — Telehealth: Payer: Self-pay | Admitting: *Deleted

## 2012-06-12 LAB — MAGNESIUM: Magnesium: 0.6 mg/dL — ABNORMAL LOW (ref 1.5–2.5)

## 2012-06-12 LAB — BASIC METABOLIC PANEL WITH GFR
BUN: 23 mg/dL (ref 6–23)
Calcium: 6.8 mg/dL — ABNORMAL LOW (ref 8.4–10.5)
Creat: 1.9 mg/dL — ABNORMAL HIGH (ref 0.50–1.10)
GFR, Est African American: 28 mL/min — ABNORMAL LOW
GFR, Est Non African American: 25 mL/min — ABNORMAL LOW
Potassium: 3.5 mEq/L (ref 3.5–5.3)

## 2012-06-12 NOTE — Telephone Encounter (Signed)
Calling to give alert lab result for pt magnesium level of 0.6 repeated and verified.Loralee Pacas [place]

## 2012-06-17 ENCOUNTER — Ambulatory Visit: Payer: Medicare Other | Admitting: Family Medicine

## 2012-06-17 ENCOUNTER — Ambulatory Visit: Payer: Medicare Other

## 2012-07-17 ENCOUNTER — Encounter: Payer: Self-pay | Admitting: Family Medicine

## 2012-07-17 DIAGNOSIS — K2210 Ulcer of esophagus without bleeding: Secondary | ICD-10-CM | POA: Insufficient documentation

## 2012-07-17 DIAGNOSIS — K449 Diaphragmatic hernia without obstruction or gangrene: Secondary | ICD-10-CM | POA: Insufficient documentation

## 2012-07-18 ENCOUNTER — Ambulatory Visit: Payer: Self-pay | Admitting: Family Medicine

## 2012-07-18 ENCOUNTER — Encounter: Payer: Self-pay | Admitting: Family Medicine

## 2012-07-18 ENCOUNTER — Ambulatory Visit (INDEPENDENT_AMBULATORY_CARE_PROVIDER_SITE_OTHER): Payer: Medicare Other | Admitting: Family Medicine

## 2012-07-18 VITALS — BP 113/59 | HR 82 | Wt 114.0 lb

## 2012-07-18 DIAGNOSIS — I2699 Other pulmonary embolism without acute cor pulmonale: Secondary | ICD-10-CM

## 2012-07-18 DIAGNOSIS — R197 Diarrhea, unspecified: Secondary | ICD-10-CM

## 2012-07-18 DIAGNOSIS — M25561 Pain in right knee: Secondary | ICD-10-CM

## 2012-07-18 DIAGNOSIS — M25569 Pain in unspecified knee: Secondary | ICD-10-CM

## 2012-07-18 DIAGNOSIS — K529 Noninfective gastroenteritis and colitis, unspecified: Secondary | ICD-10-CM

## 2012-07-18 NOTE — Progress Notes (Signed)
  Subjective:    Patient ID: Gabrielle Stafford, female    DOB: 1929/06/12, 79 y.o.   MRN: [id number] INR today is 3.8.  Pt states that she takes 4mg  on Tues & Thurs; 2mg  all other days. HPI    Review of Systems     Objective:   Physical Exam        Assessment & Plan:

## 2012-07-18 NOTE — Progress Notes (Signed)
  Subjective:    Patient ID: Gabrielle Stafford, female    DOB: 02-17-1930, 79 y.o.   MRN: [id number]  HPI Bilateal knee pain.  Worse on the left.  Has had hip pain as well.  Says she is getting more weak. Says knees really started bothering her in October.  Has been using her walker and how having a hard time getting to the bathroom. Has chronic diarrhea.  They have been swelling and feeling warm.  Did fall off a ladder a few years ago.  No prior hx ofsurgery.  .  Wears a magnetic knee wrap and is helpful.  Can't take NSAID bc on coumadin and age risk. Using capzacin and tylenol.  They feel more warm at night. Stiffness when sits for awhile. She denies any other major joints being affected. She did have some neck pain and left shoulder pain a couple of years ago. She denies any significant pain in elbows or hands. She wanted to know if she should see a rheumatologist.  Hx of PE - due for INR. No bleeding.  Review of Systems     Objective:   Physical Exam  Constitutional: She appears well-developed and well-nourished.  HENT:  Head: Normocephalic and atraumatic.  Musculoskeletal:  Bilateral knees are swollen. Both are tender along the medial joint lines. She's also tender over the lateral joint line on the right. She had a him as cogwheeling like effect with extension of her legs but I did not see this in her upper extremities. She had pain over the anterior lateral portion of both knees with flexion and extension. She does have increased warmth. No erythema.          Assessment & Plan:  Bilateral knee pain - new diagnosis. Will get xrays today.  Likely OA based on history and exam. X-rays to determine severity of OA. Would like benefit from steroid injection for reliev.  Will check Uric acid, sed rate, CBC, and ANA, because of increased warmth of bilateral joints. Wants to get extra results back consider referral for possible steroid injection for further evaluation. I think a septic joint,  especially being bilateral without any other systemic symptoms is less likely.  Chronic diarrhea-uncontrolled. I discussed considering a trial of cholestyramine. I thought we had tried this in the past but she wasn't sure. I printed out a handout about the medication off of up to date for her to review. Currently she is trying to eat activity for the natural cultures of the yogurt to see if this will help better balance rest euros. She said she did notice improvement the first couple days but then has had diarrhea since. I encouraged her to keep a daily calendar/diary to better gauge if treatment is helpful or not.Marland Kitchen   Hx of PE - See anticoagulation flowsheet for adjustments today. She was supratherapeutic.  She also has a history of B12 deficiency as well as low magnesium secondary to her chronic diarrhea. We will need to recheck his levels at some point. At this point she has difficulty even absorbing additional supplements. She is still following with GI and they're planning on doing a colonoscopy sometime soon.

## 2012-07-19 LAB — CBC
HCT: 34 % — ABNORMAL LOW (ref 36.0–46.0)
Hemoglobin: 10.9 g/dL — ABNORMAL LOW (ref 12.0–15.0)
MCH: 28.1 pg (ref 26.0–34.0)
MCHC: 32.1 g/dL (ref 30.0–36.0)
MCV: 87.6 fL (ref 78.0–100.0)

## 2012-07-19 LAB — ANA: Anti Nuclear Antibody(ANA): NEGATIVE

## 2012-07-22 ENCOUNTER — Telehealth: Payer: Self-pay | Admitting: Family Medicine

## 2012-07-22 NOTE — Telephone Encounter (Signed)
Call patient: I got the report on her x-rays. Because they were done at [facility] I can't actually view the films. There were no fractures or dislocations identified. Only some mild arthritis in both knees but not significant. Because there warm and swollen when I saw her I would like her to see my partner Dr. Rodney Langton to do an aspiration and possibly inject corticosteroid for pain relief. She can schedule at anytime this week.

## 2012-07-22 NOTE — Telephone Encounter (Signed)
Called pt and left detailed message and told her that were NO fractures or dislocations, only some mild arthritis and that she would like for her  to schedule an appt with Dr. Karie Schwalbe sometime this week for an aspiration and possible injection .Gabrielle Stafford [place]

## 2012-07-23 ENCOUNTER — Ambulatory Visit (INDEPENDENT_AMBULATORY_CARE_PROVIDER_SITE_OTHER): Payer: Medicare Other | Admitting: Sports Medicine

## 2012-07-23 ENCOUNTER — Encounter: Payer: Self-pay | Admitting: Sports Medicine

## 2012-07-23 VITALS — BP 104/58 | HR 85

## 2012-07-23 DIAGNOSIS — M25569 Pain in unspecified knee: Secondary | ICD-10-CM

## 2012-07-23 DIAGNOSIS — M25561 Pain in right knee: Secondary | ICD-10-CM

## 2012-07-23 DIAGNOSIS — M170 Bilateral primary osteoarthritis of knee: Secondary | ICD-10-CM | POA: Insufficient documentation

## 2012-07-23 NOTE — Assessment & Plan Note (Signed)
X-ray showed bilateral mild to moderate DJD. Injected both sides, there is very little fluid in the right knee and none in the left, this is not consistent with a septic joint. Formal physical therapy. Return in 4 weeks.

## 2012-07-23 NOTE — Progress Notes (Signed)
Subjective:    I'm seeing this patient as a consultation for:  Dr. Linford Arnold  CC: Bilateral knee pain  HPI: This is a very pleasant 80 year old female with a history of knee osteoarthritis who comes in with increasing pain over the medial and lateral joint lines of both knees. She denies any trauma, denies any fevers, chills. She denies any mechanical symptoms. Knees are swelling slightly. Pain is localized, moderate to severe, does not radiate, worsening.  Past medical history, Surgical history, Family history not pertinant except as noted below, Social history, Allergies, and medications have been entered into the medical record, reviewed, and no changes needed.   Review of Systems: No headache, visual changes, nausea, vomiting, diarrhea, constipation, dizziness, abdominal pain, skin rash, fevers, chills, night sweats, weight loss, swollen lymph nodes, body aches, joint swelling, muscle aches, chest pain, shortness of breath, mood changes, visual or auditory hallucinations.   Objective:   General: Well Developed, well nourished, and in no acute distress.  Neuro/Psych: Alert and oriented x3, extra-ocular muscles intact, able to move all 4 extremities, sensation grossly intact. Skin: Warm and dry, no rashes noted.  Respiratory: Not using accessory muscles, speaking in full sentences, trachea midline.  Cardiovascular: Pulses palpable, no extremity edema. Abdomen: Does not appear distended. Bilateral Knee: There is swelling on the right knee greater than the left, there is also bilateral medial and lateral joint line pain. There is no overlying erythema, induration, and only minimal warmth. ROM full in flexion and extension and lower leg rotation. Passive range of motion is not painful. Ligaments with solid consistent endpoints including ACL, PCL, LCL, MCL. Negative Mcmurray's, Apley's, and Thessalonian tests. Non painful patellar compression. Patellar glide without crepitus. Patellar  and quadriceps tendons unremarkable. Hamstring and quadriceps strength is normal.   The report from the x-ray was reviewed, I do not have the actual images. There appears to be mild to moderate DJD. This is in both knees.  Procedure: Real-time Ultrasound Guided Injection of right knee Device: GE Logiq E  Ultrasound guided injection is preferred based studies that show increased duration, increased effect, greater accuracy, decreased procedural pain, increased response rate, and decreased cost with ultrasound guided versus blind injection.  Verbal informed consent obtained.  Time-out conducted.  Noted no overlying erythema, induration, or other signs of local infection.  Skin prepped in a sterile fashion.  Local anesthesia: Topical Ethyl chloride.  With sterile technique and under real time ultrasound guidance: There was only minimal effusion seen on ultrasound, this is not consistent with a septic joint. 2 cc Kenalog 40, 4 cc lidocaine injected easily into the suprapatellar recess. Completed without difficulty  Pain immediately resolved suggesting accurate placement of the medication.  Advised to call if fevers/chills, erythema, induration, drainage, or persistent bleeding.  Images permanently stored and available for review in the ultrasound unit.  Impression: Technically successful ultrasound guided injection.  Procedure: Real-time Ultrasound Guided Injection of left knee Device: GE Logiq E  Ultrasound guided injection is preferred based studies that show increased duration, increased effect, greater accuracy, decreased procedural pain, increased response rate, and decreased cost with ultrasound guided versus blind injection.  Verbal informed consent obtained.  Time-out conducted.  Noted no overlying erythema, induration, or other signs of local infection.  Skin prepped in a sterile fashion.  Local anesthesia: Topical Ethyl chloride.  With sterile technique and under real time  ultrasound guidance:  There was no effusion seen. 2 cc Kenalog 40, 4 cc lidocaine injected easily into the suprapatellar recess.  Completed without difficulty  Pain immediately resolved suggesting accurate placement of the medication.  Advised to call if fevers/chills, erythema, induration, drainage, or persistent bleeding.  Images permanently stored and available for review in the ultrasound unit.  Impression: Technically successful ultrasound guided injection. Impression and Recommendations:   This case required medical decision making of moderate complexity.

## 2012-08-20 ENCOUNTER — Ambulatory Visit (INDEPENDENT_AMBULATORY_CARE_PROVIDER_SITE_OTHER): Payer: Medicare Other | Admitting: Sports Medicine

## 2012-08-20 ENCOUNTER — Ambulatory Visit: Payer: Medicare Other

## 2012-08-20 ENCOUNTER — Encounter: Payer: Self-pay | Admitting: Sports Medicine

## 2012-08-20 VITALS — BP 114/59 | HR 98 | Wt 112.0 lb

## 2012-08-20 DIAGNOSIS — M1710 Unilateral primary osteoarthritis, unspecified knee: Secondary | ICD-10-CM

## 2012-08-20 DIAGNOSIS — IMO0002 Reserved for concepts with insufficient information to code with codable children: Secondary | ICD-10-CM

## 2012-08-20 DIAGNOSIS — M25561 Pain in right knee: Secondary | ICD-10-CM

## 2012-08-20 NOTE — Progress Notes (Signed)
Subjective:    CC: Followup  HPI: This very pleasant 79 year old female has bilateral knee osteoarthritis: I injected her knee joints under ultrasound guidance, unfortunately pain returned after one week. Pain continues to localize to the joint lines, worse with ambulation, with morning stiffness. Pain does not radiate, moderate to severe.   Past medical history, Surgical history, Family history not pertinant except as noted below, Social history, Allergies, and medications have been entered into the medical record, reviewed, and no changes needed.   Review of Systems: No fevers, chills, night sweats, weight loss, chest pain, or shortness of breath.   Objective:    General: Well Developed, well nourished, and in no acute distress.  Neuro: Alert and oriented x3, extra-ocular muscles intact, sensation grossly intact.  HEENT: Normocephalic, atraumatic, pupils equal round reactive to light, neck supple, no masses, no lymphadenopathy, thyroid nonpalpable.  Skin: Warm and dry, no rashes. Cardiac: Regular rate and rhythm, no murmurs rubs or gallops, no lower extremity edema.  Respiratory: Clear to auscultation bilaterally. Not using accessory muscles, speaking in full sentences. Bilateral knees: Normal to inspection with no erythema or effusion or obvious bony abnormalities. Minimal joint line pain on the medial and lateral joint lines. ROM full in flexion and extension and lower leg rotation. Ligaments with solid consistent endpoints including ACL, PCL, LCL, MCL. Negative Mcmurray's, Apley's, and Thessalonian tests. Non painful patellar compression. Patellar glide without crepitus. Patellar and quadriceps tendons unremarkable. Hamstring and quadriceps strength is normal.   Procedure: Real-time Ultrasound Guided Injection of left knee Device: GE Logiq E  Ultrasound guided injection is preferred based studies that show increased duration, increased effect, greater accuracy, decreased  procedural pain, increased response rate, and decreased cost with ultrasound guided versus blind injection.  Verbal informed consent obtained.  Time-out conducted.  Noted no overlying erythema, induration, or other signs of local infection.  Skin prepped in a sterile fashion.  Local anesthesia: Topical Ethyl chloride.  With sterile technique and under real time ultrasound guidance:  2 cc Kenalog 40 and 4 cc lidocaine injected easily into the suprapatellar recess, syringe switched and 25 mg/2.5 mL of Supartz (sodium hyaluronate) in a prefilled syringe was injected easily into the knee through a 22-gauge needle. Completed without difficulty  Pain immediately resolved suggesting accurate placement of the medication.  Advised to call if fevers/chills, erythema, induration, drainage, or persistent bleeding.  Images permanently stored and available for review in the ultrasound unit.  Impression: Technically successful ultrasound guided injection.  Procedure: Real-time Ultrasound Guided Injection of right knee Device: GE Logiq E  Ultrasound guided injection is preferred based studies that show increased duration, increased effect, greater accuracy, decreased procedural pain, increased response rate, and decreased cost with ultrasound guided versus blind injection.  Verbal informed consent obtained.  Time-out conducted.  Noted no overlying erythema, induration, or other signs of local infection.  Skin prepped in a sterile fashion.  Local anesthesia: Topical Ethyl chloride.  With sterile technique and under real time ultrasound guidance:  2 cc Kenalog 40 and 4 cc lidocaine injected easily into the suprapatellar recess, syringe switched and 25 mg/2.5 mL of Supartz (sodium hyaluronate) in a prefilled syringe was injected easily into the knee through a 22-gauge needle. Completed without difficulty  Pain immediately resolved suggesting accurate placement of the medication.  Advised to call if  fevers/chills, erythema, induration, drainage, or persistent bleeding.  Images permanently stored and available for review in the ultrasound unit.  Impression: Technically successful ultrasound guided injection.  X-rays are reviewed  and show mild bilateral tricompartmental DJD, as well as mild diffuse osteopenia.  Impression and Recommendations:

## 2012-08-20 NOTE — Assessment & Plan Note (Signed)
Insufficient response with initial intra-articular injection. She discontinued her rehabilitation but she's doing on her own at the [facility]. Supartz series started today with a small amount steroid placed into both knees. Return in one week for injection #2 of 5 into both knees.

## 2012-08-29 ENCOUNTER — Encounter: Payer: Self-pay | Admitting: Sports Medicine

## 2012-08-29 ENCOUNTER — Ambulatory Visit: Payer: Self-pay | Admitting: Family Medicine

## 2012-08-29 ENCOUNTER — Ambulatory Visit (INDEPENDENT_AMBULATORY_CARE_PROVIDER_SITE_OTHER): Payer: Medicare Other | Admitting: Family Medicine

## 2012-08-29 ENCOUNTER — Encounter: Payer: Self-pay | Admitting: Family Medicine

## 2012-08-29 ENCOUNTER — Ambulatory Visit (INDEPENDENT_AMBULATORY_CARE_PROVIDER_SITE_OTHER): Payer: Medicare Other | Admitting: Sports Medicine

## 2012-08-29 VITALS — BP 106/60 | HR 80 | Wt 109.0 lb

## 2012-08-29 VITALS — BP 107/60 | HR 80 | Wt 109.0 lb

## 2012-08-29 DIAGNOSIS — E538 Deficiency of other specified B group vitamins: Secondary | ICD-10-CM

## 2012-08-29 DIAGNOSIS — M170 Bilateral primary osteoarthritis of knee: Secondary | ICD-10-CM

## 2012-08-29 DIAGNOSIS — IMO0002 Reserved for concepts with insufficient information to code with codable children: Secondary | ICD-10-CM

## 2012-08-29 DIAGNOSIS — Z86718 Personal history of other venous thrombosis and embolism: Secondary | ICD-10-CM

## 2012-08-29 DIAGNOSIS — E612 Magnesium deficiency: Secondary | ICD-10-CM

## 2012-08-29 DIAGNOSIS — M1710 Unilateral primary osteoarthritis, unspecified knee: Secondary | ICD-10-CM

## 2012-08-29 DIAGNOSIS — R197 Diarrhea, unspecified: Secondary | ICD-10-CM

## 2012-08-29 DIAGNOSIS — K529 Noninfective gastroenteritis and colitis, unspecified: Secondary | ICD-10-CM

## 2012-08-29 NOTE — Progress Notes (Signed)
  Procedure: Real-time Ultrasound Guided Injection of left knee Device: GE Logiq E  Ultrasound guided injection is preferred based studies that show increased duration, increased effect, greater accuracy, decreased procedural pain, increased response rate, and decreased cost with ultrasound guided versus blind injection.  Verbal informed consent obtained.  Time-out conducted.  Noted no overlying erythema, induration, or other signs of local infection.  Skin prepped in a sterile fashion.  Local anesthesia: Topical Ethyl chloride.  With sterile technique and under real time ultrasound guidance:  25 mg/2.5 mL of Supartz (sodium hyaluronate) in a prefilled syringe was injected easily into the knee through a 22-gauge needle. Completed without difficulty  Pain immediately resolved suggesting accurate placement of the medication.  Advised to call if fevers/chills, erythema, induration, drainage, or persistent bleeding.  Images permanently stored and available for review in the ultrasound unit.  Impression: Technically successful ultrasound guided injection.  Procedure: Real-time Ultrasound Guided Injection of right knee Device: GE Logiq E  Ultrasound guided injection is preferred based studies that show increased duration, increased effect, greater accuracy, decreased procedural pain, increased response rate, and decreased cost with ultrasound guided versus blind injection.  Verbal informed consent obtained.  Time-out conducted.  Noted no overlying erythema, induration, or other signs of local infection.  Skin prepped in a sterile fashion.  Local anesthesia: Topical Ethyl chloride.  With sterile technique and under real time ultrasound guidance:  25 mg/2.5 mL of Supartz (sodium hyaluronate) in a prefilled syringe was injected easily into the knee through a 22-gauge needle. Completed without difficulty  Pain immediately resolved suggesting accurate placement of the medication.  Advised to call if  fevers/chills, erythema, induration, drainage, or persistent bleeding.  Images permanently stored and available for review in the ultrasound unit.  Impression: Technically successful ultrasound guided injection.  

## 2012-08-29 NOTE — Assessment & Plan Note (Signed)
Supartz injection #2 of 5 into both knees. Return in one week #3 of 5 into both knees.

## 2012-08-29 NOTE — Progress Notes (Signed)
  Subjective:    Patient ID: Gabrielle Stafford, female    DOB: 02-14-1930, 79 y.o.   MRN: [id number]  HPI Chronic Diarrhea - She has been taking Activa yogurt and her diarrhea has been better.  She never read the paper I gave her about cholestyramine. She says she can take any liquid medications. Says she vomits them up.   Magnesium deficiency-she says she takes her magnesium only occasionally when she can get it down. She says sticks he top of her throat and she has a lot of difficulty taking it so rarely uses it.  Pernicious anemia-she is back on injections. She didn't tolerate the oral version well.  Review of Systems     Objective:   Physical Exam  Constitutional: She is oriented to person, place, and time. She appears well-developed and well-nourished.  HENT:  Head: Normocephalic and atraumatic.  Cardiovascular: Normal rate, regular rhythm and normal heart sounds.   Pulmonary/Chest: Effort normal and breath sounds normal.  Neurological: She is alert and oriented to person, place, and time.  Skin: Skin is warm and dry.  Psychiatric: She has a normal mood and affect. Her behavior is normal.          Assessment & Plan:  Chronic Diarrhea - fortunately she has had great results with eating 1 year workup a day. For now as this has provided a lot of relief with her we will continue this regimen. She did finally have her endoscopy. She says that they started her on a medication for some irritation in her stomach.  Chronic anticoagulation on Coumadin-please see" flowsheet for adjustments today. She was subtherapeutic.Hx fo PE.  B12 deficiency-due to recheck B12 today.  Magnesium deficiency-due to recheck magnesium. She does not take her supplementation regularly. She says she has difficulty getting the pills down.  Needs to scheudle an annual wellness Exam.

## 2012-09-04 ENCOUNTER — Telehealth: Payer: Self-pay | Admitting: *Deleted

## 2012-09-04 NOTE — Telephone Encounter (Signed)
Calling w/alert  Mag- low 0.6. She will fax over the result.Loralee Pacas [place]

## 2012-09-05 ENCOUNTER — Encounter: Payer: Medicare Other | Admitting: Family Medicine

## 2012-09-05 ENCOUNTER — Ambulatory Visit (INDEPENDENT_AMBULATORY_CARE_PROVIDER_SITE_OTHER): Payer: Medicare Other | Admitting: Sports Medicine

## 2012-09-05 ENCOUNTER — Encounter: Payer: Self-pay | Admitting: Sports Medicine

## 2012-09-05 DIAGNOSIS — M1710 Unilateral primary osteoarthritis, unspecified knee: Secondary | ICD-10-CM

## 2012-09-05 DIAGNOSIS — M170 Bilateral primary osteoarthritis of knee: Secondary | ICD-10-CM

## 2012-09-05 DIAGNOSIS — IMO0002 Reserved for concepts with insufficient information to code with codable children: Secondary | ICD-10-CM

## 2012-09-05 NOTE — Assessment & Plan Note (Signed)
Supartz injection #3 of 5 into both knees. Return in one week #4 of 5 into both knees.

## 2012-09-05 NOTE — Progress Notes (Signed)
  Procedure: Real-time Ultrasound Guided Injection of left knee Device: GE Logiq E  Ultrasound guided injection is preferred based studies that show increased duration, increased effect, greater accuracy, decreased procedural pain, increased response rate, and decreased cost with ultrasound guided versus blind injection.  Verbal informed consent obtained.  Time-out conducted.  Noted no overlying erythema, induration, or other signs of local infection.  Skin prepped in a sterile fashion.  Local anesthesia: Topical Ethyl chloride.  With sterile technique and under real time ultrasound guidance:  25 mg/2.5 mL of Supartz (sodium hyaluronate) in a prefilled syringe was injected easily into the knee through a 22-gauge needle. Completed without difficulty  Pain immediately resolved suggesting accurate placement of the medication.  Advised to call if fevers/chills, erythema, induration, drainage, or persistent bleeding.  Images permanently stored and available for review in the ultrasound unit.  Impression: Technically successful ultrasound guided injection.  Procedure: Real-time Ultrasound Guided Injection of right knee Device: GE Logiq E  Ultrasound guided injection is preferred based studies that show increased duration, increased effect, greater accuracy, decreased procedural pain, increased response rate, and decreased cost with ultrasound guided versus blind injection.  Verbal informed consent obtained.  Time-out conducted.  Noted no overlying erythema, induration, or other signs of local infection.  Skin prepped in a sterile fashion.  Local anesthesia: Topical Ethyl chloride.  With sterile technique and under real time ultrasound guidance:  25 mg/2.5 mL of Supartz (sodium hyaluronate) in a prefilled syringe was injected easily into the knee through a 22-gauge needle. Completed without difficulty  Pain immediately resolved suggesting accurate placement of the medication.  Advised to call if  fevers/chills, erythema, induration, drainage, or persistent bleeding.  Images permanently stored and available for review in the ultrasound unit.  Impression: Technically successful ultrasound guided injection.  

## 2012-09-12 ENCOUNTER — Encounter: Payer: Self-pay | Admitting: Sports Medicine

## 2012-09-12 ENCOUNTER — Ambulatory Visit (INDEPENDENT_AMBULATORY_CARE_PROVIDER_SITE_OTHER): Payer: Medicare Other | Admitting: Sports Medicine

## 2012-09-12 ENCOUNTER — Ambulatory Visit: Payer: Self-pay | Admitting: Family Medicine

## 2012-09-12 ENCOUNTER — Ambulatory Visit (INDEPENDENT_AMBULATORY_CARE_PROVIDER_SITE_OTHER): Payer: Medicare Other | Admitting: Family Medicine

## 2012-09-12 ENCOUNTER — Encounter: Payer: Self-pay | Admitting: Family Medicine

## 2012-09-12 VITALS — BP 99/54 | HR 78 | Wt 109.0 lb

## 2012-09-12 VITALS — BP 93/50 | HR 80 | Wt 109.0 lb

## 2012-09-12 DIAGNOSIS — Z86718 Personal history of other venous thrombosis and embolism: Secondary | ICD-10-CM

## 2012-09-12 DIAGNOSIS — Z0000 Encounter for general adult medical examination without abnormal findings: Secondary | ICD-10-CM

## 2012-09-12 DIAGNOSIS — M1710 Unilateral primary osteoarthritis, unspecified knee: Secondary | ICD-10-CM

## 2012-09-12 DIAGNOSIS — M170 Bilateral primary osteoarthritis of knee: Secondary | ICD-10-CM

## 2012-09-12 DIAGNOSIS — IMO0002 Reserved for concepts with insufficient information to code with codable children: Secondary | ICD-10-CM

## 2012-09-12 MED ORDER — ONDANSETRON 4 MG PO TBDP: 4.0000 mg | ORAL_TABLET | Freq: Three times a day (TID) | ORAL | Status: DC | PRN

## 2012-09-12 NOTE — Patient Instructions (Addendum)
Please let me know if you think that it would be helpful to see a nutritionist. They might be able to work with you on your diet and help you get some extra calories then. Could also consider a medication called Paxil. It can help with her mood as well as help stimulate her appetite. I know you are not interested in adding any new medications at this point time but certainly he can consider it. I sent a prescription over for Zofran, a nausea medication that disintegrating her mouth when you have those days where you were having dry heaves.

## 2012-09-12 NOTE — Progress Notes (Signed)
Subjective:    Gabrielle Stafford is a 79 y.o. female who presents for Medicare Annual/Subsequent preventive examination.  Preventive Screening-Counseling & Management  Tobacco History  Smoking status  . Former Smoker  . Types: Cigarettes  . Quit date: 04/03/1997  Smokeless tobacco  . Not on file     Problems Prior to Visit 1. she is frustrated because she still has days where she feels really nauseated and will dry heaves. She also has days where she seems to not be able to keep her food down or her medications down she says it will come right back up. She did have a colonoscopy recently which was reassuring. They have not done an endoscopy recently. She has lost some more weight. And she has difficulty taking her supplements even though she has known deficiencies such as magnesium et Karie Soda.  Current Problems (verified) Patient Active Problem List   Diagnosis Date Noted  . Osteoarthritis of both knees 07/23/2012  . Ulcerative esophagitis 07/17/2012  . Hiatal hernia 07/17/2012  . Short gut syndrome 03/19/2012  . Rib pain on right side 11/29/2011  . Chronic kidney disease (CKD), stage III (moderate) 09/23/2010  . HYPOKALEMIA 07/27/2009  . HYPOCALCEMIA 12/29/2008  . UNSPECIFIED VITAMIN D DEFICIENCY 07/06/2008  . DEPENDENT EDEMA, LEGS, BILATERAL 07/06/2008  . BACK PAIN 02/03/2008  . MICROSCOPIC HEMATURIA 11/06/2007  . ADENOCARCINOMA, COLON, HX OF 11/06/2007  . CATARACT, RIGHT EYE 09/11/2007  . B12 DEFICIENCY 07/18/2007  . ANEMIA, IRON DEFICIENCY 07/18/2007  . HYPERLIPIDEMIA 05/24/2007  . OSTEOPOROSIS 05/09/2007  . PULMONARY EMBOLISM, HX OF 05/09/2007    Medications Prior to Visit Current Outpatient Prescriptions on File Prior to Visit  Medication Sig Dispense Refill  . CALCIUM CITRATE PO Take 500-630 mg by mouth.        . Cholecalciferol (VITAMIN D3) 2000 UNITS TABS Take by mouth daily.        . cyanocobalamin (,VITAMIN B-12,) 1000 MCG/ML injection Inject 1,000 mcg into  the muscle every 30 (thirty) days.      . furosemide (LASIX) 40 MG tablet Take 0.5 tablets (20 mg total) by mouth daily.  60 tablet  0  . IRON PO Take 45 mg by mouth.       . loratadine (CLARITIN) 10 MG tablet Take 10 mg by mouth daily.      . magnesium gluconate (MAGONATE) 500 MG tablet Take 2 tablets (1,000 mg total) by mouth 2 (two) times daily.  60 tablet  1  . Multiple Minerals-Vitamins (CALCIUM CITRATE PLUS/MAGNESIUM) TABS Take by mouth.      Marland Kitchen omeprazole (PRILOSEC) 20 MG capsule Take 20 mg by mouth daily.      . potassium chloride (KLOR-CON 10) 10 MEQ tablet Take 2 tablets (20 mEq total) by mouth 2 (two) times daily.  120 tablet  6  . ranitidine (ZANTAC) 75 MG tablet Take 75 mg by mouth daily.      . Vitamins C E (CRANBERRY CONCENTRATE PO) Take by mouth.      . warfarin (COUMADIN) 4 MG tablet Take 1 tablet (4 mg total) by mouth daily.  90 tablet  2   No current facility-administered medications on file prior to visit.    Current Medications (verified) Current Outpatient Prescriptions  Medication Sig Dispense Refill  . CALCIUM CITRATE PO Take 500-630 mg by mouth.        . Cholecalciferol (VITAMIN D3) 2000 UNITS TABS Take by mouth daily.        . cyanocobalamin (,VITAMIN B-12,) 1000  MCG/ML injection Inject 1,000 mcg into the muscle every 30 (thirty) days.      . furosemide (LASIX) 40 MG tablet Take 0.5 tablets (20 mg total) by mouth daily.  60 tablet  0  . IRON PO Take 45 mg by mouth.       . loratadine (CLARITIN) 10 MG tablet Take 10 mg by mouth daily.      . magnesium gluconate (MAGONATE) 500 MG tablet Take 2 tablets (1,000 mg total) by mouth 2 (two) times daily.  60 tablet  1  . Multiple Minerals-Vitamins (CALCIUM CITRATE PLUS/MAGNESIUM) TABS Take by mouth.      Marland Kitchen omeprazole (PRILOSEC) 20 MG capsule Take 20 mg by mouth daily.      . potassium chloride (KLOR-CON 10) 10 MEQ tablet Take 2 tablets (20 mEq total) by mouth 2 (two) times daily.  120 tablet  6  . ranitidine (ZANTAC) 75 MG  tablet Take 75 mg by mouth daily.      . Vitamins C E (CRANBERRY CONCENTRATE PO) Take by mouth.      . warfarin (COUMADIN) 4 MG tablet Take 1 tablet (4 mg total) by mouth daily.  90 tablet  2   No current facility-administered medications for this visit.     Allergies (verified) Alendronate sodium; Azithromycin; Ciprofloxacin; Nitrofurantoin; Sulfamethoxazole w-trimethoprim; Tramadol hcl; and Keflex   PAST HISTORY  Family History Family History  Problem Relation Age of Onset  . Alcohol abuse Father   . Gout Father   . Heart attack Father     brothers  . Diabetes Father   . Hypertension Father   . Cancer Father     bladder tumor  . Colon cancer Mother     colon  . Pulmonary embolism Mother     leg amputation  . Depression Mother   . Hyperlipidemia Mother   . Diabetes Sister   . Breast cancer Sister   . Arthritis      aunt    Social History History  Substance Use Topics  . Smoking status: Former Smoker    Types: Cigarettes    Quit date: 04/03/1997  . Smokeless tobacco: Not on file  . Alcohol Use: No     Are there smokers in your home (other than you)? No  Risk Factors Current exercise habits: Gym/ health club routine includes silver sneakers.  Dietary issues discussed: none   Cardiac risk factors: advanced age (older than 42 for men, 55 for women).  Depression Screen (Note: if answer to either of the following is "Yes", a more complete depression screening is indicated)   Over the past two weeks, have you felt down, depressed or hopeless? No  Over the past two weeks, have you felt little interest or pleasure in doing things? Yes  Have you lost interest or pleasure in daily life? Yes  Do you often feel hopeless? No  Do you cry easily over simple problems? No  Activities of Daily Living In your present state of health, do you have any difficulty performing the following activities?:  Driving? No Managing money?  No Feeding yourself? No Getting from bed to  chair? No Climbing a flight of stairs? Yes Preparing food and eating?: Yes Bathing or showering? Yes Getting dressed: No Getting to the toilet? No Using the toilet:No Moving around from place to place: Yes In the past year have you fallen or had a near fall?:Yes   Are you sexually active?  No  Do you have more than one  partner?  No  Hearing Difficulties: No Do you often ask people to speak up or repeat themselves? No Do you experience ringing or noises in your ears? No Do you have difficulty understanding soft or whispered voices? Yes   Do you feel that you have a problem with memory? Yes  Do you often misplace items? Yes  Do you feel safe at home?  Yes  Cognitive Testing  Alert? Yes  Normal Appearance?Yes  Oriented to person? Yes  Place? Yes   Time? Yes  Recall of three objects?  Yes  Can perform simple calculations? Yes  Displays appropriate judgment?Yes  Can read the correct time from a watch face?Yes   Advanced Directives have been discussed with the patient? Yes  List the Names of Other Physician/Practitioners you currently use: 1.  Dr. Mathis Bud 2.  Dr Hardie Shackleton 3.  Dr. Georgiana Shore (GI).  - has appt in 3 months.   4. Dr. Tyler Deis (Nephrology) 5. Dr Kreg Shropshire ([facility])  Indicate any recent Medical Services you may have received from other than [facility] providers in the past year (date may be approximate).  Immunization History  Administered Date(s) Administered  . H1N1 03/04/2008  . Influenza Split 12/16/2010, 12/14/2011  . Influenza Whole 12/23/2009  . Pneumococcal Polysaccharide 04/03/1998  . Tdap 09/22/2010  . Zoster 02/04/2010    Screening Tests Health Maintenance  Topic Date Due  . Mammogram  10/03/2011  . Influenza Vaccine  12/02/2012  . Colonoscopy  02/11/2013  . Tetanus/tdap  09/21/2020  . Pneumococcal Polysaccharide Vaccine Age 43 And Over  Completed  . Zostavax  Completed    All answers were reviewed with the patient and necessary  referrals were made:  Amaka Gluth, MD   09/12/2012   History reviewed: allergies, current medications, past family history, past medical history, past social history, past surgical history and problem list  Review of Systems A comprehensive review of systems was negative.    Objective:     Vision by Dr. Hardie Shackleton in Jan, 2014.  Has new lenses.    Body mass index is 19.93 kg/(m^2). BP 93/50  Pulse 80  Wt 109 lb (49.442 kg)  BMI 19.93 kg/m2  BP 93/50  Pulse 80  Wt 109 lb (49.442 kg)  BMI 19.93 kg/m2  General Appearance:    Alert, cooperative, no distress, appears stated age  Head:    Normocephalic, without obvious abnormality, atraumatic  Eyes:    PER, conjunctiva/corneas clear, EOM's intact, both eyes  Ears:    Normal TM's and external ear canals, both ears  Nose:   Nares normal, septum midline, mucosa normal, no drainage    or sinus tenderness  Throat:   Lips, mucosa, and tongue normal; teeth and gums normal  Neck:   Supple, symmetrical, trachea midline, no adenopathy;    thyroid:  no enlargement/tenderness/nodules; no carotid   bruit or JVD  Back:     Symmetric, no curvature, ROM normal,  Lungs:     Clear to auscultation bilaterally, respirations unlabored  Chest Wall:    No tenderness or deformity   Heart:    Regular rate and rhythm, S1 and S2 normal, no murmur, rub   or gallop  Breast Exam:    Not performed.   Abdomen:     Soft, non-tender, bowel sounds active all four quadrants,    no masses, no organomegaly  Genitalia:    Not performed.   Rectal:    Not perfromed.   Extremities:   Extremities normal, atraumatic, no  cyanosis or edema trace ankle edema bilaterally.   Pulses:   2+ and symmetric all extremities  Skin:   Skin color, texture, turgor normal, no rashes or lesions  Lymph nodes:   Cervical, supraclavicular, and axillary nodes normal  Neurologic:   CNII-XII intact gross motor intact.         Assessment:     Medicare Wellness Exam     Plan:      During the course of the visit the patient was educated and counseled about appropriate screening and preventive services including:    Advanced directives: make sure they are UTD  Decreased appetite and signs of depression. I discussed that she might be a good candidate for an antidepressant that might actually help stimulate appetite. I think Paxil would be a great choice for her. She declined and said she doesn't want to take any pills and she doesn't absolutely have to. She went on to tell me how she has difficulty swallowing pills.  Colonoscopy has been updated.  Dysphasia-I. encouraged her to follow up with her GI. Some but she hasn't had a recent endoscopy and it sounds like pills get caught in her throat and then she vomits them back up. She says it has more to do with her and her nausea is that of a mechanical problem in her esophagus.  Nausea-gave her Zofran disintegrating tabs to try and see if at least this is helpful and his her she feels really nauseated and has dry heaves.  I also talked her about options to help her with her chronic diarrhea and has that this would also improve her mobility help her get out of the house and help with her depression and mood in addition to helping with her weight loss but again she declines to take any medications for this.  Anticoagulation-please see Coumadin flow sheet for adjustments to her Coumadin today.  Diet review for nutrition referral? Yes ___x_  Not Indicated ___.  Offered referral.     Patient Instructions (the written plan) was given to the patient.  Medicare Attestation I have personally reviewed: The patient's medical and social history Their use of alcohol, tobacco or illicit drugs Their current medications and supplements The patient's functional ability including ADLs,fall risks, home safety risks, cognitive, and hearing and visual impairment Diet and physical activities Evidence for depression or mood disorders  The  patient's weight, height, BMI, and visual acuity have been recorded in the chart.  I have made referrals, counseling, and provided education to the patient based on review of the above and I have provided the patient with a written personalized care plan for preventive services.     Ozell Juhasz, MD   09/12/2012

## 2012-09-12 NOTE — Assessment & Plan Note (Signed)
Supartz injection #4 of 5 into both knees. Return in one week #5 of 5 into both knees.

## 2012-09-12 NOTE — Progress Notes (Signed)
  Procedure: Real-time Ultrasound Guided Injection of left knee Device: GE Logiq E  Ultrasound guided injection is preferred based studies that show increased duration, increased effect, greater accuracy, decreased procedural pain, increased response rate, and decreased cost with ultrasound guided versus blind injection.  Verbal informed consent obtained.  Time-out conducted.  Noted no overlying erythema, induration, or other signs of local infection.  Skin prepped in a sterile fashion.  Local anesthesia: Topical Ethyl chloride.  With sterile technique and under real time ultrasound guidance:  25 mg/2.5 mL of Supartz (sodium hyaluronate) in a prefilled syringe was injected easily into the knee through a 22-gauge needle. Completed without difficulty  Pain immediately resolved suggesting accurate placement of the medication.  Advised to call if fevers/chills, erythema, induration, drainage, or persistent bleeding.  Images permanently stored and available for review in the ultrasound unit.  Impression: Technically successful ultrasound guided injection.  Procedure: Real-time Ultrasound Guided Injection of right knee Device: GE Logiq E  Ultrasound guided injection is preferred based studies that show increased duration, increased effect, greater accuracy, decreased procedural pain, increased response rate, and decreased cost with ultrasound guided versus blind injection.  Verbal informed consent obtained.  Time-out conducted.  Noted no overlying erythema, induration, or other signs of local infection.  Skin prepped in a sterile fashion.  Local anesthesia: Topical Ethyl chloride.  With sterile technique and under real time ultrasound guidance:  25 mg/2.5 mL of Supartz (sodium hyaluronate) in a prefilled syringe was injected easily into the knee through a 22-gauge needle. Completed without difficulty  Pain immediately resolved suggesting accurate placement of the medication.  Advised to call if  fevers/chills, erythema, induration, drainage, or persistent bleeding.  Images permanently stored and available for review in the ultrasound unit.  Impression: Technically successful ultrasound guided injection.  

## 2012-09-19 ENCOUNTER — Ambulatory Visit: Payer: Self-pay | Admitting: Sports Medicine

## 2012-09-19 ENCOUNTER — Ambulatory Visit (INDEPENDENT_AMBULATORY_CARE_PROVIDER_SITE_OTHER): Payer: Medicare Other | Admitting: Sports Medicine

## 2012-09-19 ENCOUNTER — Encounter: Payer: Self-pay | Admitting: Sports Medicine

## 2012-09-19 VITALS — BP 103/56 | HR 82 | Wt 102.0 lb

## 2012-09-19 DIAGNOSIS — M170 Bilateral primary osteoarthritis of knee: Secondary | ICD-10-CM

## 2012-09-19 DIAGNOSIS — Z86718 Personal history of other venous thrombosis and embolism: Secondary | ICD-10-CM

## 2012-09-19 DIAGNOSIS — IMO0002 Reserved for concepts with insufficient information to code with codable children: Secondary | ICD-10-CM

## 2012-09-19 DIAGNOSIS — M1710 Unilateral primary osteoarthritis, unspecified knee: Secondary | ICD-10-CM

## 2012-09-19 DIAGNOSIS — R110 Nausea: Secondary | ICD-10-CM

## 2012-09-19 LAB — POCT INR: INR: 1.1

## 2012-09-19 MED ORDER — DIPHENOXYLATE-ATROPINE 2.5-0.025 MG PO TABS: ORAL_TABLET | ORAL | Status: DC

## 2012-09-19 MED ORDER — FLUTICASONE PROPIONATE 50 MCG/ACT NA SUSP: NASAL | Status: DC

## 2012-09-19 MED ORDER — ONDANSETRON HCL 4 MG PO TABS
8.0000 mg | ORAL_TABLET | Freq: Once | ORAL | Status: AC
  Administered 2012-09-19: 8 mg via ORAL

## 2012-09-19 NOTE — Assessment & Plan Note (Signed)
Finish Supartz series into both knees, return in one month.

## 2012-09-19 NOTE — Assessment & Plan Note (Signed)
2 mg daily, was controlled way back on this dose, but she has been skipping many doses. I worry that if we actually have her take the prescribed dose she will become supratherapeutic. She worked very hard on taking the exact dose,  2 mg, every day. She'll return in 2 weeks to recheck.

## 2012-09-19 NOTE — Assessment & Plan Note (Signed)
I do think some of this is related to postnasal drip, Gabrielle Stafford is a little bit orthostatic, and she does have some loose stools. She is able to take orals and I've encouraged her to push oral salt-containing fluids. We are going to give her some nausea medicine I'm also going to give her some Flonase to help her postnasal drip. I've asked her to followup with [name] Monday if she's not feeling any better. We gave her Zofran here in the office. I'm also going to add Lomotil.

## 2012-09-19 NOTE — Progress Notes (Signed)
Subjective:    CC: Knee injections  HPI: Gabrielle Stafford is here for knee osteoarthritis and for Supartz injections #5 of 5. Overall she notes an excellent response to Supartz.  Unfortunately she's also noted a flare in her chronic nausea. She did get some nausea medicine called in by her primary care provider however she has not yet gone to pick it up from the pharmacy. She feels as though she's lost a good amount of weight, she is able to keep down orals and is not vomiting, but is having multiple stools per day which is also normal for her. Her oral intake has dropped, and she is slightly dizzy when standing up too quickly. She feels as though she can increase her oral fluid intake if we can control her nausea somewhat.  Past medical history, Surgical history, Family history not pertinant except as noted below, Social history, Allergies, and medications have been entered into the medical record, reviewed, and no changes needed.   Review of Systems: No fevers, chills, night sweats, weight loss, chest pain, or shortness of breath.   Objective:    General: Well Developed, well nourished, and in no acute distress.  Neuro: Alert and oriented x3, extra-ocular muscles intact, sensation grossly intact.  HEENT: Normocephalic, atraumatic, pupils equal round reactive to light, neck supple, no masses, no lymphadenopathy, thyroid nonpalpable.  Skin: Warm and dry, no rashes. Skin turgor is normal. Cardiac: Regular rate and rhythm, no murmurs rubs or gallops, no lower extremity edema.  Respiratory: Clear to auscultation bilaterally. Not using accessory muscles, speaking in full sentences.  Procedure: Real-time Ultrasound Guided Injection of left knee Device: GE Logiq E  Ultrasound guided injection is preferred based studies that show increased duration, increased effect, greater accuracy, decreased procedural pain, increased response rate, and decreased cost with ultrasound guided versus blind injection.    Verbal informed consent obtained.  Time-out conducted.  Noted no overlying erythema, induration, or other signs of local infection.  Skin prepped in a sterile fashion.  Local anesthesia: Topical Ethyl chloride.  With sterile technique and under real time ultrasound guidance:  25 mg/2.5 mL of Supartz (sodium hyaluronate) in a prefilled syringe was injected easily into the knee through a 22-gauge needle. Completed without difficulty  Pain immediately resolved suggesting accurate placement of the medication.  Advised to call if fevers/chills, erythema, induration, drainage, or persistent bleeding.  Images permanently stored and available for review in the ultrasound unit.  Impression: Technically successful ultrasound guided injection.  Procedure: Real-time Ultrasound Guided Injection of right knee Device: GE Logiq E  Ultrasound guided injection is preferred based studies that show increased duration, increased effect, greater accuracy, decreased procedural pain, increased response rate, and decreased cost with ultrasound guided versus blind injection.  Verbal informed consent obtained.  Time-out conducted.  Noted no overlying erythema, induration, or other signs of local infection.  Skin prepped in a sterile fashion.  Local anesthesia: Topical Ethyl chloride.  With sterile technique and under real time ultrasound guidance:  25 mg/2.5 mL of Supartz (sodium hyaluronate) in a prefilled syringe was injected easily into the knee through a 22-gauge needle. Completed without difficulty  Pain immediately resolved suggesting accurate placement of the medication.  Advised to call if fevers/chills, erythema, induration, drainage, or persistent bleeding.  Images permanently stored and available for review in the ultrasound unit.  Impression: Technically successful ultrasound guided injection. Impression and Recommendations:    I spent greater than 40 minutes with this patient, greater than 50% was  face to face  time counseling regarding nausea/dehydration and knee osteoarthritis.

## 2012-10-03 ENCOUNTER — Ambulatory Visit: Payer: Medicare Other

## 2012-10-08 ENCOUNTER — Ambulatory Visit: Payer: Self-pay | Admitting: Family Medicine

## 2012-10-08 ENCOUNTER — Encounter: Payer: Self-pay | Admitting: *Deleted

## 2012-10-08 ENCOUNTER — Ambulatory Visit (INDEPENDENT_AMBULATORY_CARE_PROVIDER_SITE_OTHER): Payer: Medicare Other | Admitting: Family Medicine

## 2012-10-08 VITALS — BP 99/53 | HR 90

## 2012-10-08 DIAGNOSIS — Z7901 Long term (current) use of anticoagulants: Secondary | ICD-10-CM

## 2012-10-08 DIAGNOSIS — Z86718 Personal history of other venous thrombosis and embolism: Secondary | ICD-10-CM

## 2012-10-08 MED ORDER — WARFARIN SODIUM 2 MG PO TABS: 2.0000 mg | ORAL_TABLET | Freq: Every day | ORAL | Status: DC

## 2012-10-08 NOTE — Progress Notes (Signed)
  Subjective:    Patient ID: Gabrielle Stafford, female    DOB: [birth year], 79 y.o.   MRN: [id number]  HPI  See anticoagulation flowsheet.  Review of Systems     Objective:   Physical Exam        Assessment & Plan:

## 2012-10-08 NOTE — Progress Notes (Signed)
  Subjective:    Patient ID: Gabrielle Stafford, female    DOB: 12-Oct-1929, 79 y.o.   MRN: [id number] Pt denies any excessive bruising or bleeding. 5 mins spent with pt.  HPI    Review of Systems     Objective:   Physical Exam        Assessment & Plan:

## 2012-10-08 NOTE — Progress Notes (Signed)
  Subjective:    Patient ID: Gabrielle Stafford, female    DOB: 12/23/1929, 79 y.o.   MRN: [id number]  HPI    Review of Systems     Objective:   Physical Exam        Assessment & Plan:  Pt notified of new Coumadin dose and that Coumadin 1mg  tabs have been sent to her pharmacy. Barry Dienes, LPN

## 2012-10-15 ENCOUNTER — Ambulatory Visit: Payer: Medicare Other | Admitting: *Deleted

## 2012-10-15 ENCOUNTER — Ambulatory Visit: Payer: Medicare Other

## 2012-10-15 ENCOUNTER — Ambulatory Visit: Payer: Medicare Other | Admitting: Family Medicine

## 2012-10-17 ENCOUNTER — Ambulatory Visit (INDEPENDENT_AMBULATORY_CARE_PROVIDER_SITE_OTHER): Payer: Medicare Other | Admitting: Sports Medicine

## 2012-10-17 ENCOUNTER — Encounter: Payer: Self-pay | Admitting: Sports Medicine

## 2012-10-17 VITALS — BP 113/63 | HR 90 | Wt 106.0 lb

## 2012-10-17 DIAGNOSIS — IMO0002 Reserved for concepts with insufficient information to code with codable children: Secondary | ICD-10-CM

## 2012-10-17 DIAGNOSIS — M170 Bilateral primary osteoarthritis of knee: Secondary | ICD-10-CM

## 2012-10-17 DIAGNOSIS — M1710 Unilateral primary osteoarthritis, unspecified knee: Secondary | ICD-10-CM

## 2012-10-17 NOTE — Assessment & Plan Note (Signed)
Gabrielle Stafford is doing extremely well, she is status post a steroid injection as well as a full series of Supartz into both knees. She is also been working extensively in the gym with resistance training, and is now putting enough weight on the machine to build strength. She is pain-free, she walks up more stability, and is happy with the results. I'm turning her loose.

## 2012-10-17 NOTE — Progress Notes (Signed)
  Subjective:    CC:  Followup  HPI: Gabrielle Stafford has bilateral osteoarthritis, she has been through a single steroid injection to both knees, as well as a full series of Supartz. She returns today pain-free, doing well with her home exercises.  Past medical history, Surgical history, Family history not pertinant except as noted below, Social history, Allergies, and medications have been entered into the medical record, reviewed, and no changes needed.   Review of Systems: No fevers, chills, night sweats, weight loss, chest pain, or shortness of breath.   Objective:    General: Well Developed, well nourished, and in no acute distress.  Neuro: Alert and oriented x3, extra-ocular muscles intact, sensation grossly intact.  HEENT: Normocephalic, atraumatic, pupils equal round reactive to light, neck supple, no masses, no lymphadenopathy, thyroid nonpalpable.  Skin: Warm and dry, no rashes. Cardiac: Regular rate and rhythm, no murmurs rubs or gallops, no lower extremity edema.  Respiratory: Clear to auscultation bilaterally. Not using accessory muscles, speaking in full sentences. Bilateral Knee: Normal to inspection with no erythema or effusion or obvious bony abnormalities. Palpation normal with no warmth, joint line tenderness, patellar tenderness, or condyle tenderness. ROM full in flexion and extension and lower leg rotation. Ligaments with solid consistent endpoints including ACL, PCL, LCL, MCL. Negative Mcmurray's, Apley's, and Thessalonian tests. Non painful patellar compression. Patellar glide without crepitus. Patellar and quadriceps tendons unremarkable. Hamstring and quadriceps strength is normal.   Impression and Recommendations:

## 2012-12-30 ENCOUNTER — Encounter: Payer: Self-pay | Admitting: *Deleted

## 2012-12-30 ENCOUNTER — Other Ambulatory Visit: Payer: Self-pay | Admitting: Family Medicine

## 2012-12-30 ENCOUNTER — Ambulatory Visit (INDEPENDENT_AMBULATORY_CARE_PROVIDER_SITE_OTHER): Payer: Medicare Other | Admitting: Family Medicine

## 2012-12-30 VITALS — BP 91/48 | HR 83 | Wt 105.0 lb

## 2012-12-30 DIAGNOSIS — Z86718 Personal history of other venous thrombosis and embolism: Secondary | ICD-10-CM

## 2012-12-30 NOTE — Progress Notes (Signed)
I called patient gave her new medication dosage and advised her to have levels rechecked in one week, she stated that she is going to be out of town for a month but if it is necessary she can have someone to check it and call the office with her numbers. Sequoya Hogsett,CMA

## 2012-12-30 NOTE — Progress Notes (Signed)
Patient was in office for INR check and she stated that she is leaving Wedensday going out of town for a month, she wants to know if you can refill her Lasix and send to [facility]. Ernest Popowski,CMA

## 2013-02-19 ENCOUNTER — Telehealth: Payer: Self-pay | Admitting: *Deleted

## 2013-02-19 NOTE — Telephone Encounter (Signed)
Pt stated that she is feeling really bad and she has a lot of questions and stated that she would like to be seen by an endocrinologist. I told her to keep her appt on Friday so that she can discuss her concerns and whether or not she should be referred to an endocrinologist. Pt voiced understanding and agreed.Gabrielle Stafford Eudora

## 2013-02-21 ENCOUNTER — Encounter: Payer: Self-pay | Admitting: Family Medicine

## 2013-02-21 ENCOUNTER — Ambulatory Visit (INDEPENDENT_AMBULATORY_CARE_PROVIDER_SITE_OTHER): Payer: Medicare Other | Admitting: Family Medicine

## 2013-02-21 ENCOUNTER — Ambulatory Visit (INDEPENDENT_AMBULATORY_CARE_PROVIDER_SITE_OTHER): Payer: Medicare Other

## 2013-02-21 VITALS — BP 90/56 | HR 89 | Wt 96.0 lb

## 2013-02-21 DIAGNOSIS — K449 Diaphragmatic hernia without obstruction or gangrene: Secondary | ICD-10-CM

## 2013-02-21 DIAGNOSIS — R634 Abnormal weight loss: Secondary | ICD-10-CM

## 2013-02-21 DIAGNOSIS — R0602 Shortness of breath: Secondary | ICD-10-CM

## 2013-02-21 DIAGNOSIS — E41 Nutritional marasmus: Secondary | ICD-10-CM

## 2013-02-21 DIAGNOSIS — K912 Postsurgical malabsorption, not elsewhere classified: Secondary | ICD-10-CM

## 2013-02-21 DIAGNOSIS — E43 Unspecified severe protein-calorie malnutrition: Secondary | ICD-10-CM

## 2013-02-21 DIAGNOSIS — R630 Anorexia: Secondary | ICD-10-CM

## 2013-02-21 DIAGNOSIS — R64 Cachexia: Secondary | ICD-10-CM

## 2013-02-21 DIAGNOSIS — R1110 Vomiting, unspecified: Secondary | ICD-10-CM

## 2013-02-21 DIAGNOSIS — R112 Nausea with vomiting, unspecified: Secondary | ICD-10-CM

## 2013-02-21 LAB — GLUCOSE, POCT (MANUAL RESULT ENTRY): POC Glucose: 116 mg/dl — AB (ref 70–99)

## 2013-02-21 MED ORDER — RANITIDINE HCL 300 MG PO TABS: 300.0000 mg | ORAL_TABLET | Freq: Two times a day (BID) | ORAL | Status: DC

## 2013-02-21 NOTE — Progress Notes (Signed)
Subjective:    Patient ID: Gabrielle Stafford, female    DOB: 29-Apr-1929, 79 y.o.   MRN: [id number]  HPI  pt reports that she has not been feeling well x 1 year. she has been losing weight, cannot seem to keep anything on her stomach her.  Has lost 30 lbs in the last year.  Says can't keep food down at times.  She tries to swallow it and then vomits it back up. BP has been running low as well. She did cut her Lasix in half but has been taking it daily. Occasionally she'll take a whole tab if the swelling around her ankles gets worse. Spends most of the day in bed person who is here with her today. Hx of colon cancer. She did see GI back in April of this year. She had a colonoscopy and endoscopy. Skin  Has been very dry.  Says the Lomotil doesn't work then quit taking it. Remove this from her medication list. She also tried Zofran for nausea but says it really didn't help. She would vomit anyway so stopped that as well..  Will vomit occ. Will get dry heaves at night. Has been more forgetful over the last year.  Has had shakes at times.  Last saw Dr. Abbe Amsterdam was a year ago. She has been having episodes of feeling lightheaded but yesterday she actually passed out twice while trying to sit up.  She gets really faint at times.   She stopped her zantac a while back bc ran out of it. Can't take PPI. No chest pain. She says she occasionally feels short of breath. This has been gradual and is not sudden in onset. She denies any cough, fevers or chills. Yesterday she ate half of a chicken salad sandwich and had about a quarter cup of soup. Today she is only had one piece of toast.   Review of Systems  BP 90/56  Pulse 89  Wt 96 lb (43.545 kg)    Allergies  Allergen Reactions  . Alendronate Sodium   . Azithromycin   . Ciprofloxacin   . Nitrofurantoin   . Sulfamethoxazole-Trimethoprim   . Tramadol Hcl     REACTION: nausea  . Keflex [Cephalexin] Itching and Rash    Past Medical History  Diagnosis Date   . Hypokalemia   . DVT (deep venous thrombosis)   . PE (pulmonary thromboembolism)   . Stented coronary artery   . Short gut syndrome   . Arthritis   . Cardiac arrest   . Cancer   . Hip fracture     Left    Past Surgical History  Procedure Laterality Date  . Cataract extraction, bilateral  1999  . Cholecystectomy  1999  . Colon surgery  08/20/97    colon cancer    History   Social History  . Marital Status: Widowed    Spouse Name: N/A    Number of Children: 1  . Years of Education: N/A   Occupational History  . retired    Social History Main Topics  . Smoking status: Former Smoker    Types: Cigarettes    Quit date: 04/03/1997  . Smokeless tobacco: Not on file  . Alcohol Use: No  . Drug Use: No  . Sexual Activity: Not on file     Comment: portrait artist, retired, assoc degree, married, regularly exercises   Other Topics Concern  . Not on file   Social History Narrative   Widowed. Husband died in 08/21/2010.  No regular exercise.      Family History  Problem Relation Age of Onset  . Alcohol abuse Father   . Gout Father   . Heart attack Father     brothers  . Diabetes Father   . Hypertension Father   . Cancer Father     bladder tumor  . Colon cancer Mother     colon  . Pulmonary embolism Mother     leg amputation  . Depression Mother   . Hyperlipidemia Mother   . Diabetes Sister   . Breast cancer Sister   . Arthritis      aunt    Outpatient Encounter Prescriptions as of 02/21/2013  Medication Sig  . cyanocobalamin (,VITAMIN B-12,) 1000 MCG/ML injection Inject 1,000 mcg into the muscle every 30 (thirty) days.  . fluticasone (FLONASE) 50 MCG/ACT nasal spray One spray in each nostril twice a day, use left hand for right nostril, and right hand for left nostril.  . furosemide (LASIX) 40 MG tablet TAKE 1/2 TABLET DAILY AS NEEDED.  Marland Kitchen IRON PO Take 45 mg by mouth.   . loratadine (CLARITIN) 10 MG tablet Take 10 mg by mouth daily.  . magnesium gluconate  (MAGONATE) 500 MG tablet Take 2 tablets (1,000 mg total) by mouth 2 (two) times daily.  . Multiple Minerals-Vitamins (CALCIUM CITRATE PLUS/MAGNESIUM) TABS Take by mouth.  . potassium chloride (KLOR-CON 10) 10 MEQ tablet Take 2 tablets (20 mEq total) by mouth 2 (two) times daily.  . ranitidine (ZANTAC) 300 MG tablet Take 1 tablet (300 mg total) by mouth 2 (two) times daily.  Marland Kitchen warfarin (COUMADIN) 2 MG tablet Take 1 tablet (2 mg total) by mouth daily.  . [DISCONTINUED] diphenoxylate-atropine (LOMOTIL) 2.5-0.025 MG per tablet One to 2 tablets by mouth 4 times a day as needed for diarrhea.  . [DISCONTINUED] furosemide (LASIX) 40 MG tablet TAKE (1) TABLET DAILY AS NEEDED.  . [DISCONTINUED] ondansetron (ZOFRAN-ODT) 4 MG disintegrating tablet Take 1 tablet (4 mg total) by mouth every 8 (eight) hours as needed for nausea.  . [DISCONTINUED] ranitidine (ZANTAC) 75 MG tablet Take 75 mg by mouth daily.  . [DISCONTINUED] Vitamins C E (CRANBERRY CONCENTRATE PO) Take by mouth.  . [DISCONTINUED] CALCIUM CITRATE PO Take 500-630 mg by mouth.    . [DISCONTINUED] Cholecalciferol (VITAMIN D3) 2000 UNITS TABS Take by mouth daily.            Objective:   Physical Exam  Constitutional: She is oriented to person, place, and time. She appears well-developed and well-nourished.  Thin cachetic appearing female  HENT:  Head: Normocephalic and atraumatic.  Right Ear: External ear normal.  Left Ear: External ear normal.  Nose: Nose normal.  Mouth/Throat: Oropharynx is clear and moist.  TMs and canals are clear.   Eyes: Conjunctivae and EOM are normal. Pupils are equal, round, and reactive to light.  Neck: Neck supple. No thyromegaly present.  Cardiovascular: Normal rate, regular rhythm and normal heart sounds.   Pulmonary/Chest: Effort normal and breath sounds normal. She has no wheezes.  Abdominal: Soft. Bowel sounds are normal. She exhibits no distension and no mass. There is no tenderness. There is no rebound  and no guarding.  Musculoskeletal:  Trace ankle edema bilat.   Lymphadenopathy:    She has no cervical adenopathy.  Neurological: She is alert and oriented to person, place, and time.  Skin: Skin is warm and dry.  + skin tenting. +mild jaundice  Psychiatric: She has a normal mood and affect.  Assessment & Plan:   Severe GERD with vomiting - had endoscopy in April showing no sign of stricture but did have erosive esophagitis. She stopped her H2 blocker on her own. She is intolerant to PPIs. Encouraged her to get back on the H2 blocker Zantac. We'll increase this to 300 mg and pressure to take it twice a day. I want her to start drinking to ensure a day since she does not seem to be able to keep down solid food very well. Will check electrolytes as well as blood count and thyroid today. I really think her significant weight loss is from poor caloric intake. Overall she probably took about 400 calories yesterday and today has probably only eating about 60 calories. I would like to [facility] evaluation. She was noted to have erosive esophagitis and a hiatal hernia on endoscopy back in April. Could consider belladonna. Patient is very fearful of trying any new medications.  Severe malnutrition-she is barely getting enough calories to survive. We discussed that it's extremely important that she try to get calories in. She is very fearful of eating because it tends to cause diarrhea so she just avoids eating. I would like her to try getting in at least 2 to ensure per day. We will get lab work today to see if she has had any impact on her kidneys her liver function as well as electrolytes. She does still take her potassium daily. Ciardi has known chronic kidney disease, stage III. Also encouraged her to stay hydrated as much as possible. Again on it this causes diarrhea but it's extremely important. If need be can discuss further with GI whether not she might even  benefit from a GI tube or possibly a colostomy bag. I know this would not be optimal if she is unable to keep calories and to sustain her life then I think this may need be a consideration.  Hypotension-I want her to cut her Lasix to every other day. She started having a tab in half. I would like to stop it completely but I know she will take it anyway. She gets discomfort when her ankles swell and feels like she cannot do without her Lasix. But I warned about the dangers of lowering her blood pressure when she is very weak and not getting enough calories in our he has low blood pressure anyway. I do not want her to lose consciousness or pass out again. Reminded her how extremely dangerous this can be.  Short gut syndrome-she's tried multiple medications with her GI Dr. Gwenevere Abbot. I really don't have a lot more to offer her.  Hypokalemia-recheck potassium today.  CKD 3-recheck kidney function today.

## 2013-02-21 NOTE — Patient Instructions (Signed)
Decrease lasix to 1/2 tab every other day Drink 2 ensure a day.

## 2013-02-22 LAB — TSH: TSH: 1.72 u[IU]/mL (ref 0.350–4.500)

## 2013-02-22 LAB — CBC WITH DIFFERENTIAL/PLATELET
Basophils Absolute: 0 10*3/uL (ref 0.0–0.1)
Eosinophils Relative: 0 % (ref 0–5)
HCT: 28.7 % — ABNORMAL LOW (ref 36.0–46.0)
Lymphocytes Relative: 19 % (ref 12–46)
Lymphs Abs: 1.7 10*3/uL (ref 0.7–4.0)
MCH: 28.6 pg (ref 26.0–34.0)
MCV: 88.3 fL (ref 78.0–100.0)
Monocytes Relative: 5 % (ref 3–12)
Neutro Abs: 6.7 10*3/uL (ref 1.7–7.7)
Platelets: 317 10*3/uL (ref 150–400)
RBC: 3.25 MIL/uL — ABNORMAL LOW (ref 3.87–5.11)
RDW: 14.6 % (ref 11.5–15.5)
WBC: 8.9 10*3/uL (ref 4.0–10.5)

## 2013-02-22 LAB — COMPLETE METABOLIC PANEL WITH GFR
ALT: 12 U/L (ref 0–35)
AST: 9 U/L (ref 0–37)
Alkaline Phosphatase: 163 U/L — ABNORMAL HIGH (ref 39–117)
BUN: 52 mg/dL — ABNORMAL HIGH (ref 6–23)
Calcium: 7.7 mg/dL — ABNORMAL LOW (ref 8.4–10.5)
Creat: 5.26 mg/dL — ABNORMAL HIGH (ref 0.50–1.10)
GFR, Est Non African American: 7 mL/min — ABNORMAL LOW
Total Bilirubin: 0.4 mg/dL (ref 0.3–1.2)

## 2013-02-22 LAB — SEDIMENTATION RATE: Sed Rate: 42 mm/hr — ABNORMAL HIGH (ref 0–22)

## 2013-02-25 ENCOUNTER — Ambulatory Visit: Payer: Medicare Other | Admitting: Family Medicine

## 2013-03-14 DIAGNOSIS — T66XXXA Radiation sickness, unspecified, initial encounter: Secondary | ICD-10-CM | POA: Insufficient documentation

## 2013-03-31 ENCOUNTER — Other Ambulatory Visit: Payer: Self-pay | Admitting: Family Medicine

## 2013-04-07 ENCOUNTER — Ambulatory Visit: Payer: Medicare Other | Admitting: *Deleted

## 2013-04-08 ENCOUNTER — Telehealth: Payer: Self-pay | Admitting: *Deleted

## 2013-04-08 NOTE — Telephone Encounter (Signed)
Pt called wanting her cholestyramin oral susp switched to pill form. I looked back on her chart to make the change and informed pt that this med is not on her list. She informed me that GI had given this to her a few mos ago and I told her that she will need to call their office to get this change made.Audelia Hives [place]

## 2013-04-09 ENCOUNTER — Other Ambulatory Visit: Payer: Self-pay | Admitting: Family Medicine

## 2013-04-24 ENCOUNTER — Encounter: Payer: Self-pay | Admitting: Family Medicine

## 2013-04-24 ENCOUNTER — Telehealth: Payer: Self-pay | Admitting: Family Medicine

## 2013-04-24 ENCOUNTER — Ambulatory Visit (INDEPENDENT_AMBULATORY_CARE_PROVIDER_SITE_OTHER): Payer: Medicare Other | Admitting: Family Medicine

## 2013-04-24 VITALS — BP 89/48 | HR 91 | Temp 97.4°F | Wt 95.0 lb

## 2013-04-24 DIAGNOSIS — K912 Postsurgical malabsorption, not elsewhere classified: Secondary | ICD-10-CM

## 2013-04-24 DIAGNOSIS — F3289 Other specified depressive episodes: Secondary | ICD-10-CM

## 2013-04-24 DIAGNOSIS — R202 Paresthesia of skin: Secondary | ICD-10-CM

## 2013-04-24 DIAGNOSIS — F329 Major depressive disorder, single episode, unspecified: Secondary | ICD-10-CM

## 2013-04-24 DIAGNOSIS — R209 Unspecified disturbances of skin sensation: Secondary | ICD-10-CM

## 2013-04-24 DIAGNOSIS — F32A Depression, unspecified: Secondary | ICD-10-CM | POA: Insufficient documentation

## 2013-04-24 DIAGNOSIS — N179 Acute kidney failure, unspecified: Secondary | ICD-10-CM

## 2013-04-24 DIAGNOSIS — H538 Other visual disturbances: Secondary | ICD-10-CM

## 2013-04-24 DIAGNOSIS — Z86718 Personal history of other venous thrombosis and embolism: Secondary | ICD-10-CM

## 2013-04-24 NOTE — Progress Notes (Signed)
Note, after is able to get a copy of the discharge note from hospital. They had recommended a CBC, CMP INR. We did not order a CBC. I think that her oncologist may have that. I will certainly followup on this. Also they had discontinued her Lasix. When she came in to see me she had been taking a half of a tab every other day.

## 2013-04-24 NOTE — Telephone Encounter (Signed)
Please call Gabrielle Stafford. If patient still has one refill left on her furosemide. Please have them cancel it.

## 2013-04-24 NOTE — Addendum Note (Signed)
Addended by: Beatrice Lecher D on: 04/24/2013 05:46 PM   Modules accepted: Orders, Medications

## 2013-04-24 NOTE — Progress Notes (Signed)
Subjective:    Patient ID: Gabrielle Stafford, female    DOB: 06-01-1929, 80 y.o.   MRN: [id number]  HPI Short gut syndrome - She is feeling really down and sad. Saw GI at [facility] and says having a hard time getting there bc physiclally having a hard time.  Drinking about 60 ounces per day of fluid. Did try the cholestryramine and says it coated her mouth and couldn't tolerate it.    Blurry vision x 2 days - thinks it is a medication side effect. She wants to know which one could be causing it. She says that vision changes as listed on multiple's of her bottles and she wants to make sure she stops the offending agent. When asked her more specifically what she meant by blurry vision she said that at one point it looked like there were pedicle.soma walls. Over the last 2 days she's just noticed that she's had difficulty reading.  Has felt more down lately.  Her son is with her today as well and agrees that she's been more down more withdrawn. She spends most of the day lying in bed and actually does not sit up very often. She started a workup at [facility] with GI. Evidently she and the gastroenterologist got into a disagreement. He recommended that she take cholestyramine which she has tried in the past. He gave her the tablet form and the power form. She was not able to keep down either one. Initially she said she tried it for 2-3 days and later in the conversation she says she actually tried it for a week. She says every time it made her very sick and ill and she would vomit. They did an endoscopy while she was hospitalized for acute renal failure and it was negative for any type of specific lesion. It sounds like she has also been scheduled for some type of swallow study but says that she doesn't want to go. She or he canceled once because she felt lightheaded and dizzy that day. She has very poor by mouth intake. The she says she's been working really hard on trying to get 60 ounces of fluid a day.  She still takes her Lasix half a tab every other day.  She does complain of paresthesias in the fingers and toes. It is fairly persistent but does get a little better at times. No known triggers.  Review of Systems     Objective:   Physical Exam  Constitutional: She is oriented to person, place, and time. She appears well-developed and well-nourished.  HENT:  Head: Normocephalic and atraumatic.  Eyes: Conjunctivae and EOM are normal.  Cardiovascular: Normal rate.   Pulmonary/Chest: Effort normal.  Neurological: She is alert and oriented to person, place, and time.  Skin: Skin is dry. No pallor.  Psychiatric: She has a normal mood and affect. Her behavior is normal.          Assessment & Plan:  Short gut syndrome-we had a long discussion about this. She tells me how she does her bowels she is on a daily basis but yet doesn't want to move forward with any further evaluation or trials of medication. I explained to her that we cannot attempt to help her feel better if she's not willing to ask to try any therapies. I don't know if this is driven somewhat from some mild depression. I'm not sure what exactly the barrier is. She kept saying that she was too weak to walk from  the parking lot into the GI office at [facility] and I explained that that is absolutely not a barrier. There will to chair could make that accommodation and help her get from the parking lot into the building without her having to and light. Her to have multiple patients better able to come in for office visits and use a wheelchair to do so because they're unable to insulate. I did discuss the importance of getting back on her multivitamin. She had stopped it on her own because she felt like she was taking several supplements including magnesium and iron and thought she didn't need a multivitamin. I explained that the multivitamin provides a daily dose where as the supplement she is taking are treating deficiencies. I do feel  it is a significant psychological component to her difficulty swallowing things. It is possible she could have a swallowing disorder.  Paresthesias in fingers and toes-likely related to poor nutritional intake as well as elemental deficiencies such as B12 et Ronney Asters. This certainly could be related to neck and back there she's not complaining of neck or back pain today.  Depression-I definitely think she is depressed after a discussion today. She said at least 3 times during the office visit that she didn't want to pursue any treatment yet she was here to talk about ways to get her better. She contraindicated herself multiple times in the office visit. We had a long discussion about this. Her son is equally as fresh rated. We discussed that maybe we need to address the depression separately. Offered to schedule her for therapy. Also discussed that there are medications that can help as well though I know she's not imaged in taking a pill at this time. Patient declined therapy.-    I need to get records from her hospitalization. She has never had a hospital followup after her acute renal failure. It's not clear to me what her baseline creatinine was after discharge.  Acute renal failure-will need to get notes. We'll recheck BUN creatinine and potassium today.  She wants to know she should still be continued on sodium bicarbonate. She was told was to help her stomach and it was started during hospitalization. I'm not sure as we will need to recheck her blood work.  Blurry vision-unclear etiology. Discussed this certainly could be a Lomotil, which she uses as needed for diarrhea.. Does have atropine at which can affect vision. The rest of her medication she takes on a consistent basis and none of them are new. Also explained her that low blood pressure can cause her to have vision changes as well. I asked her she did notice a pattern as far as the vision been more blurry when she uses a Lomotil. She says she  really hasn't noticed and is not really sure when she last took the Lomotil.  History of pulmonary embolism-we tried to obtain an INR today but we were unable to do a fingerstick. We sent her to the lab for a blood draw.  Time spent 45 minutes, greater than 50% spent counseling over short gut syndrome, paresthesias, depression, acute renal failure, blurry vision.

## 2013-04-25 LAB — COMPLETE METABOLIC PANEL WITH GFR
ALT: 9 U/L (ref 0–35)
AST: 8 U/L (ref 0–37)
Albumin: 3.6 g/dL (ref 3.5–5.2)
Alkaline Phosphatase: 232 U/L — ABNORMAL HIGH (ref 39–117)
BILIRUBIN TOTAL: 0.3 mg/dL (ref 0.3–1.2)
BUN: 53 mg/dL — AB (ref 6–23)
CHLORIDE: 111 meq/L (ref 96–112)
CO2: 12 mEq/L — ABNORMAL LOW (ref 19–32)
CREATININE: 6.66 mg/dL — AB (ref 0.50–1.10)
GFR, EST AFRICAN AMERICAN: 6 mL/min — AB
GFR, Est Non African American: 5 mL/min — ABNORMAL LOW
Glucose, Bld: 93 mg/dL (ref 70–99)
Potassium: 2.6 mEq/L — CL (ref 3.5–5.3)
Sodium: 141 mEq/L (ref 135–145)
Total Protein: 6 g/dL (ref 6.0–8.3)

## 2013-04-25 LAB — PROTIME-INR
INR: 1.86 — AB (ref <1.50)
PROTHROMBIN TIME: 21 s — AB (ref 11.6–15.2)

## 2013-04-25 NOTE — Telephone Encounter (Signed)
Called and informed Gabrielle Stafford that all of pt's furosemide will need to be cancelled per Dr.Metheney.Audelia Hives [place]

## 2013-05-05 ENCOUNTER — Telehealth: Payer: Self-pay | Admitting: *Deleted

## 2013-05-05 NOTE — Telephone Encounter (Signed)
Pt called and lvm stating that she has been in the hospital x 1 week she will start dialysis on Tuesday for 3x a week and will call [facility] to [facility] the nutrition appointment that was scheduled for her she also stated that she will come by and have labs done as soon as she can and wanted to make Dr. Madilyn Fireman aware of what was going on.Gabrielle Stafford [place]

## 2013-06-04 NOTE — Telephone Encounter (Signed)
Left message to check on her and see how she's doing. I informed her that have been out of town for about 3 weeks and was unable to get back with her but I did want to see how she's doing. Asked her to please give [name] a call and give [name] an update if she's able to.

## 2013-08-07 DIAGNOSIS — Z992 Dependence on renal dialysis: Secondary | ICD-10-CM

## 2013-08-07 DIAGNOSIS — N186 End stage renal disease: Secondary | ICD-10-CM | POA: Insufficient documentation

## 2014-02-05 IMAGING — CR DG RIBS W/ CHEST 3+V*R*
3 series · 3 of 3 positions shown · non-contrast
Comparison: None.

CLINICAL DATA: Right-sided rib injury.  Anterior pain.

RIGHT RIBS AND CHEST - 3+ VIEW

[view not recorded (1 of 3)]
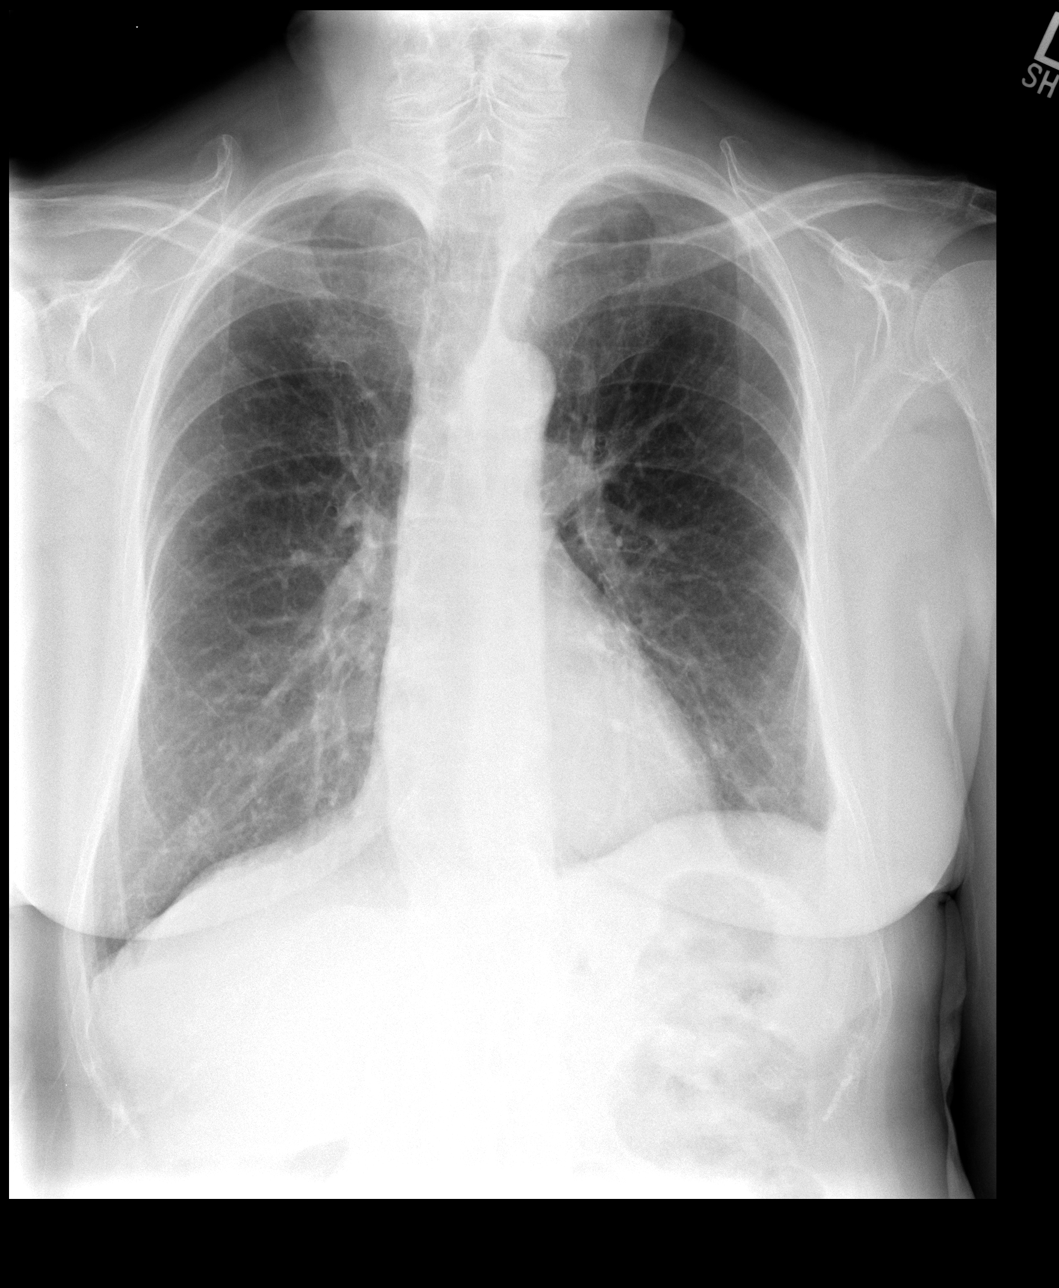

[view not recorded (2 of 3)]
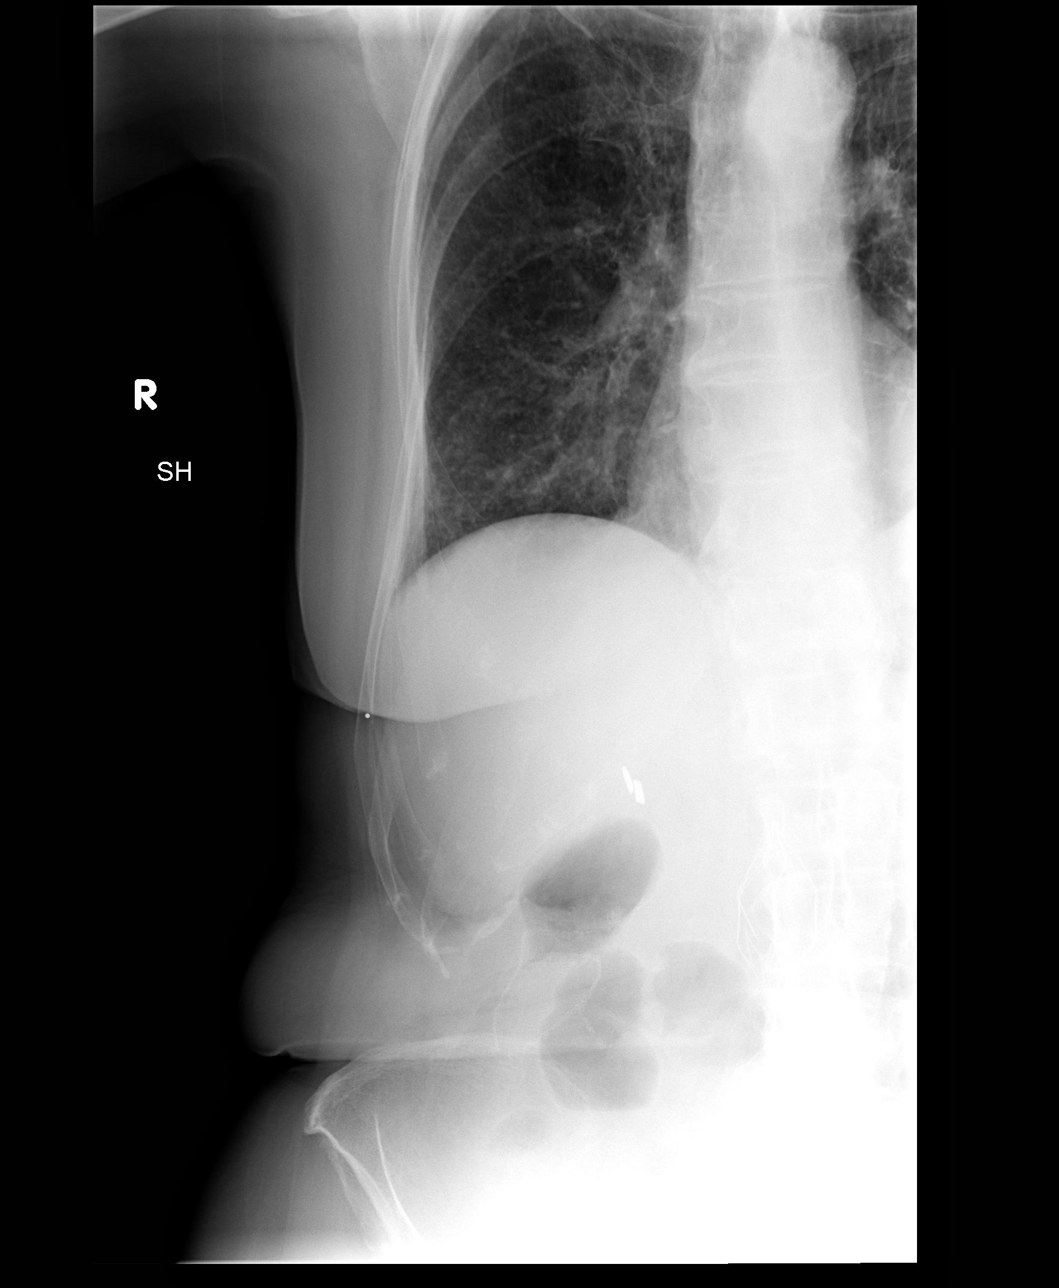

[view not recorded (3 of 3)]
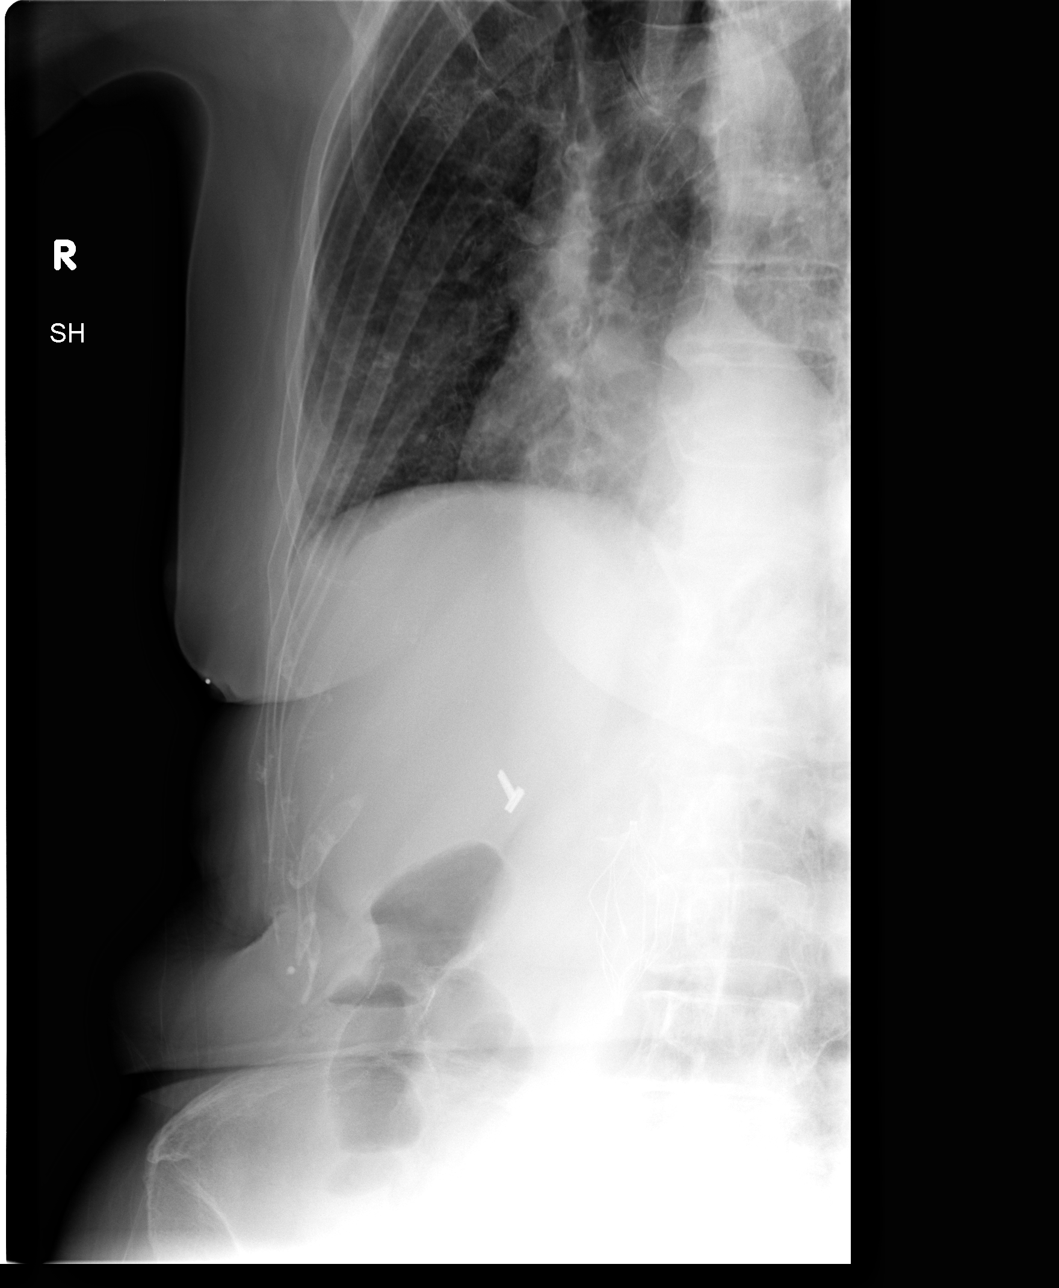

[3 of 3 positions shown; findings below may reference images not displayed]

FINDINGS: The heart size is normal.  Chronic interstitial
coarsening is present.  There is some blunting of the left
costophrenic angle.  No focal airspace disease is evident.
Emphysematous changes are noted.

Dedicated images of the right ribs demonstrate no acute or healing
fractures.  Surgical clips are present in the gallbladder.  An IVC
filter is in place.
IMPRESSION: 1.  No evidence for acute or healing fracture within the right
ribs.
2.  Emphysema and chronic interstitial changes.
4.  Blunting of the left costophrenic angle is likely chronic.

## 2014-04-03 DIAGNOSIS — N2581 Secondary hyperparathyroidism of renal origin: Secondary | ICD-10-CM | POA: Diagnosis not present

## 2014-04-03 DIAGNOSIS — N186 End stage renal disease: Secondary | ICD-10-CM | POA: Diagnosis not present

## 2014-04-03 DIAGNOSIS — D631 Anemia in chronic kidney disease: Secondary | ICD-10-CM | POA: Diagnosis not present

## 2014-04-06 DIAGNOSIS — I82409 Acute embolism and thrombosis of unspecified deep veins of unspecified lower extremity: Secondary | ICD-10-CM | POA: Diagnosis not present

## 2014-04-06 DIAGNOSIS — N2581 Secondary hyperparathyroidism of renal origin: Secondary | ICD-10-CM | POA: Diagnosis not present

## 2014-04-06 DIAGNOSIS — D631 Anemia in chronic kidney disease: Secondary | ICD-10-CM | POA: Diagnosis not present

## 2014-04-06 DIAGNOSIS — N186 End stage renal disease: Secondary | ICD-10-CM | POA: Diagnosis not present

## 2014-04-08 DIAGNOSIS — N2581 Secondary hyperparathyroidism of renal origin: Secondary | ICD-10-CM | POA: Diagnosis not present

## 2014-04-08 DIAGNOSIS — D631 Anemia in chronic kidney disease: Secondary | ICD-10-CM | POA: Diagnosis not present

## 2014-04-08 DIAGNOSIS — N186 End stage renal disease: Secondary | ICD-10-CM | POA: Diagnosis not present

## 2014-04-10 DIAGNOSIS — D631 Anemia in chronic kidney disease: Secondary | ICD-10-CM | POA: Diagnosis not present

## 2014-04-10 DIAGNOSIS — N186 End stage renal disease: Secondary | ICD-10-CM | POA: Diagnosis not present

## 2014-04-10 DIAGNOSIS — N2581 Secondary hyperparathyroidism of renal origin: Secondary | ICD-10-CM | POA: Diagnosis not present

## 2014-04-13 DIAGNOSIS — N186 End stage renal disease: Secondary | ICD-10-CM | POA: Diagnosis not present

## 2014-04-13 DIAGNOSIS — I82409 Acute embolism and thrombosis of unspecified deep veins of unspecified lower extremity: Secondary | ICD-10-CM | POA: Diagnosis not present

## 2014-04-13 DIAGNOSIS — N2581 Secondary hyperparathyroidism of renal origin: Secondary | ICD-10-CM | POA: Diagnosis not present

## 2014-04-13 DIAGNOSIS — D631 Anemia in chronic kidney disease: Secondary | ICD-10-CM | POA: Diagnosis not present

## 2014-04-15 DIAGNOSIS — N186 End stage renal disease: Secondary | ICD-10-CM | POA: Diagnosis not present

## 2014-04-15 DIAGNOSIS — N2581 Secondary hyperparathyroidism of renal origin: Secondary | ICD-10-CM | POA: Diagnosis not present

## 2014-04-15 DIAGNOSIS — D631 Anemia in chronic kidney disease: Secondary | ICD-10-CM | POA: Diagnosis not present

## 2014-04-16 DIAGNOSIS — D510 Vitamin B12 deficiency anemia due to intrinsic factor deficiency: Secondary | ICD-10-CM | POA: Diagnosis not present

## 2014-04-17 DIAGNOSIS — N2581 Secondary hyperparathyroidism of renal origin: Secondary | ICD-10-CM | POA: Diagnosis not present

## 2014-04-17 DIAGNOSIS — N186 End stage renal disease: Secondary | ICD-10-CM | POA: Diagnosis not present

## 2014-04-17 DIAGNOSIS — D631 Anemia in chronic kidney disease: Secondary | ICD-10-CM | POA: Diagnosis not present

## 2014-04-20 DIAGNOSIS — N186 End stage renal disease: Secondary | ICD-10-CM | POA: Diagnosis not present

## 2014-04-20 DIAGNOSIS — N2581 Secondary hyperparathyroidism of renal origin: Secondary | ICD-10-CM | POA: Diagnosis not present

## 2014-04-20 DIAGNOSIS — I82409 Acute embolism and thrombosis of unspecified deep veins of unspecified lower extremity: Secondary | ICD-10-CM | POA: Diagnosis not present

## 2014-04-20 DIAGNOSIS — D631 Anemia in chronic kidney disease: Secondary | ICD-10-CM | POA: Diagnosis not present

## 2014-04-22 DIAGNOSIS — N186 End stage renal disease: Secondary | ICD-10-CM | POA: Diagnosis not present

## 2014-04-22 DIAGNOSIS — D631 Anemia in chronic kidney disease: Secondary | ICD-10-CM | POA: Diagnosis not present

## 2014-04-22 DIAGNOSIS — N2581 Secondary hyperparathyroidism of renal origin: Secondary | ICD-10-CM | POA: Diagnosis not present

## 2014-04-27 DIAGNOSIS — I82409 Acute embolism and thrombosis of unspecified deep veins of unspecified lower extremity: Secondary | ICD-10-CM | POA: Diagnosis not present

## 2014-04-27 DIAGNOSIS — D631 Anemia in chronic kidney disease: Secondary | ICD-10-CM | POA: Diagnosis not present

## 2014-04-27 DIAGNOSIS — N186 End stage renal disease: Secondary | ICD-10-CM | POA: Diagnosis not present

## 2014-04-27 DIAGNOSIS — N2581 Secondary hyperparathyroidism of renal origin: Secondary | ICD-10-CM | POA: Diagnosis not present

## 2014-04-29 DIAGNOSIS — N2581 Secondary hyperparathyroidism of renal origin: Secondary | ICD-10-CM | POA: Diagnosis not present

## 2014-04-29 DIAGNOSIS — D631 Anemia in chronic kidney disease: Secondary | ICD-10-CM | POA: Diagnosis not present

## 2014-04-29 DIAGNOSIS — N186 End stage renal disease: Secondary | ICD-10-CM | POA: Diagnosis not present

## 2014-05-01 DIAGNOSIS — N186 End stage renal disease: Secondary | ICD-10-CM | POA: Diagnosis not present

## 2014-05-01 DIAGNOSIS — D631 Anemia in chronic kidney disease: Secondary | ICD-10-CM | POA: Diagnosis not present

## 2014-05-01 DIAGNOSIS — N2581 Secondary hyperparathyroidism of renal origin: Secondary | ICD-10-CM | POA: Diagnosis not present

## 2014-05-03 DIAGNOSIS — N186 End stage renal disease: Secondary | ICD-10-CM | POA: Diagnosis not present

## 2014-05-03 DIAGNOSIS — Z992 Dependence on renal dialysis: Secondary | ICD-10-CM | POA: Diagnosis not present

## 2014-05-04 DIAGNOSIS — D509 Iron deficiency anemia, unspecified: Secondary | ICD-10-CM | POA: Diagnosis not present

## 2014-05-04 DIAGNOSIS — D631 Anemia in chronic kidney disease: Secondary | ICD-10-CM | POA: Diagnosis not present

## 2014-05-04 DIAGNOSIS — I82409 Acute embolism and thrombosis of unspecified deep veins of unspecified lower extremity: Secondary | ICD-10-CM | POA: Diagnosis not present

## 2014-05-04 DIAGNOSIS — N2581 Secondary hyperparathyroidism of renal origin: Secondary | ICD-10-CM | POA: Diagnosis not present

## 2014-05-04 DIAGNOSIS — N186 End stage renal disease: Secondary | ICD-10-CM | POA: Diagnosis not present

## 2014-05-04 DIAGNOSIS — Z23 Encounter for immunization: Secondary | ICD-10-CM | POA: Diagnosis not present

## 2014-05-06 DIAGNOSIS — D631 Anemia in chronic kidney disease: Secondary | ICD-10-CM | POA: Diagnosis not present

## 2014-05-06 DIAGNOSIS — D509 Iron deficiency anemia, unspecified: Secondary | ICD-10-CM | POA: Diagnosis not present

## 2014-05-06 DIAGNOSIS — N2581 Secondary hyperparathyroidism of renal origin: Secondary | ICD-10-CM | POA: Diagnosis not present

## 2014-05-06 DIAGNOSIS — Z23 Encounter for immunization: Secondary | ICD-10-CM | POA: Diagnosis not present

## 2014-05-06 DIAGNOSIS — N186 End stage renal disease: Secondary | ICD-10-CM | POA: Diagnosis not present

## 2014-05-08 DIAGNOSIS — Z23 Encounter for immunization: Secondary | ICD-10-CM | POA: Diagnosis not present

## 2014-05-08 DIAGNOSIS — D509 Iron deficiency anemia, unspecified: Secondary | ICD-10-CM | POA: Diagnosis not present

## 2014-05-08 DIAGNOSIS — D631 Anemia in chronic kidney disease: Secondary | ICD-10-CM | POA: Diagnosis not present

## 2014-05-08 DIAGNOSIS — N2581 Secondary hyperparathyroidism of renal origin: Secondary | ICD-10-CM | POA: Diagnosis not present

## 2014-05-08 DIAGNOSIS — N186 End stage renal disease: Secondary | ICD-10-CM | POA: Diagnosis not present

## 2014-05-11 DIAGNOSIS — D631 Anemia in chronic kidney disease: Secondary | ICD-10-CM | POA: Diagnosis not present

## 2014-05-11 DIAGNOSIS — N2581 Secondary hyperparathyroidism of renal origin: Secondary | ICD-10-CM | POA: Diagnosis not present

## 2014-05-11 DIAGNOSIS — I82409 Acute embolism and thrombosis of unspecified deep veins of unspecified lower extremity: Secondary | ICD-10-CM | POA: Diagnosis not present

## 2014-05-11 DIAGNOSIS — D509 Iron deficiency anemia, unspecified: Secondary | ICD-10-CM | POA: Diagnosis not present

## 2014-05-11 DIAGNOSIS — Z23 Encounter for immunization: Secondary | ICD-10-CM | POA: Diagnosis not present

## 2014-05-11 DIAGNOSIS — N186 End stage renal disease: Secondary | ICD-10-CM | POA: Diagnosis not present

## 2014-05-13 DIAGNOSIS — D631 Anemia in chronic kidney disease: Secondary | ICD-10-CM | POA: Diagnosis not present

## 2014-05-13 DIAGNOSIS — N186 End stage renal disease: Secondary | ICD-10-CM | POA: Diagnosis not present

## 2014-05-13 DIAGNOSIS — Z23 Encounter for immunization: Secondary | ICD-10-CM | POA: Diagnosis not present

## 2014-05-13 DIAGNOSIS — D509 Iron deficiency anemia, unspecified: Secondary | ICD-10-CM | POA: Diagnosis not present

## 2014-05-13 DIAGNOSIS — N2581 Secondary hyperparathyroidism of renal origin: Secondary | ICD-10-CM | POA: Diagnosis not present

## 2014-05-14 DIAGNOSIS — D510 Vitamin B12 deficiency anemia due to intrinsic factor deficiency: Secondary | ICD-10-CM | POA: Diagnosis not present

## 2014-05-15 DIAGNOSIS — D631 Anemia in chronic kidney disease: Secondary | ICD-10-CM | POA: Diagnosis not present

## 2014-05-15 DIAGNOSIS — N2581 Secondary hyperparathyroidism of renal origin: Secondary | ICD-10-CM | POA: Diagnosis not present

## 2014-05-15 DIAGNOSIS — N186 End stage renal disease: Secondary | ICD-10-CM | POA: Diagnosis not present

## 2014-05-18 DIAGNOSIS — N186 End stage renal disease: Secondary | ICD-10-CM | POA: Diagnosis not present

## 2014-05-18 DIAGNOSIS — N2581 Secondary hyperparathyroidism of renal origin: Secondary | ICD-10-CM | POA: Diagnosis not present

## 2014-05-18 DIAGNOSIS — I82409 Acute embolism and thrombosis of unspecified deep veins of unspecified lower extremity: Secondary | ICD-10-CM | POA: Diagnosis not present

## 2014-05-18 DIAGNOSIS — D631 Anemia in chronic kidney disease: Secondary | ICD-10-CM | POA: Diagnosis not present

## 2014-05-20 DIAGNOSIS — D631 Anemia in chronic kidney disease: Secondary | ICD-10-CM | POA: Diagnosis not present

## 2014-05-20 DIAGNOSIS — N186 End stage renal disease: Secondary | ICD-10-CM | POA: Diagnosis not present

## 2014-05-20 DIAGNOSIS — N2581 Secondary hyperparathyroidism of renal origin: Secondary | ICD-10-CM | POA: Diagnosis not present

## 2014-05-22 DIAGNOSIS — N2581 Secondary hyperparathyroidism of renal origin: Secondary | ICD-10-CM | POA: Diagnosis not present

## 2014-05-22 DIAGNOSIS — N186 End stage renal disease: Secondary | ICD-10-CM | POA: Diagnosis not present

## 2014-05-22 DIAGNOSIS — D631 Anemia in chronic kidney disease: Secondary | ICD-10-CM | POA: Diagnosis not present

## 2014-05-25 DIAGNOSIS — N2581 Secondary hyperparathyroidism of renal origin: Secondary | ICD-10-CM | POA: Diagnosis not present

## 2014-05-25 DIAGNOSIS — I82409 Acute embolism and thrombosis of unspecified deep veins of unspecified lower extremity: Secondary | ICD-10-CM | POA: Diagnosis not present

## 2014-05-25 DIAGNOSIS — N186 End stage renal disease: Secondary | ICD-10-CM | POA: Diagnosis not present

## 2014-05-25 DIAGNOSIS — D631 Anemia in chronic kidney disease: Secondary | ICD-10-CM | POA: Diagnosis not present

## 2014-05-27 DIAGNOSIS — N186 End stage renal disease: Secondary | ICD-10-CM | POA: Diagnosis not present

## 2014-05-27 DIAGNOSIS — N2581 Secondary hyperparathyroidism of renal origin: Secondary | ICD-10-CM | POA: Diagnosis not present

## 2014-05-27 DIAGNOSIS — D631 Anemia in chronic kidney disease: Secondary | ICD-10-CM | POA: Diagnosis not present

## 2014-05-29 DIAGNOSIS — N2581 Secondary hyperparathyroidism of renal origin: Secondary | ICD-10-CM | POA: Diagnosis not present

## 2014-05-29 DIAGNOSIS — D631 Anemia in chronic kidney disease: Secondary | ICD-10-CM | POA: Diagnosis not present

## 2014-05-29 DIAGNOSIS — N186 End stage renal disease: Secondary | ICD-10-CM | POA: Diagnosis not present

## 2014-06-01 DIAGNOSIS — Z992 Dependence on renal dialysis: Secondary | ICD-10-CM | POA: Diagnosis not present

## 2014-06-01 DIAGNOSIS — N186 End stage renal disease: Secondary | ICD-10-CM | POA: Diagnosis not present

## 2014-06-01 DIAGNOSIS — D631 Anemia in chronic kidney disease: Secondary | ICD-10-CM | POA: Diagnosis not present

## 2014-06-01 DIAGNOSIS — N2581 Secondary hyperparathyroidism of renal origin: Secondary | ICD-10-CM | POA: Diagnosis not present

## 2014-06-03 DIAGNOSIS — N186 End stage renal disease: Secondary | ICD-10-CM | POA: Diagnosis not present

## 2014-06-03 DIAGNOSIS — I82409 Acute embolism and thrombosis of unspecified deep veins of unspecified lower extremity: Secondary | ICD-10-CM | POA: Diagnosis not present

## 2014-06-03 DIAGNOSIS — D631 Anemia in chronic kidney disease: Secondary | ICD-10-CM | POA: Diagnosis not present

## 2014-06-03 DIAGNOSIS — N2581 Secondary hyperparathyroidism of renal origin: Secondary | ICD-10-CM | POA: Diagnosis not present

## 2014-06-03 DIAGNOSIS — D509 Iron deficiency anemia, unspecified: Secondary | ICD-10-CM | POA: Diagnosis not present

## 2014-06-05 DIAGNOSIS — D631 Anemia in chronic kidney disease: Secondary | ICD-10-CM | POA: Diagnosis not present

## 2014-06-05 DIAGNOSIS — N186 End stage renal disease: Secondary | ICD-10-CM | POA: Diagnosis not present

## 2014-06-05 DIAGNOSIS — N2581 Secondary hyperparathyroidism of renal origin: Secondary | ICD-10-CM | POA: Diagnosis not present

## 2014-06-05 DIAGNOSIS — D509 Iron deficiency anemia, unspecified: Secondary | ICD-10-CM | POA: Diagnosis not present

## 2014-06-08 DIAGNOSIS — N2581 Secondary hyperparathyroidism of renal origin: Secondary | ICD-10-CM | POA: Diagnosis not present

## 2014-06-08 DIAGNOSIS — D509 Iron deficiency anemia, unspecified: Secondary | ICD-10-CM | POA: Diagnosis not present

## 2014-06-08 DIAGNOSIS — N186 End stage renal disease: Secondary | ICD-10-CM | POA: Diagnosis not present

## 2014-06-08 DIAGNOSIS — D631 Anemia in chronic kidney disease: Secondary | ICD-10-CM | POA: Diagnosis not present

## 2014-06-10 DIAGNOSIS — N2581 Secondary hyperparathyroidism of renal origin: Secondary | ICD-10-CM | POA: Diagnosis not present

## 2014-06-10 DIAGNOSIS — D631 Anemia in chronic kidney disease: Secondary | ICD-10-CM | POA: Diagnosis not present

## 2014-06-10 DIAGNOSIS — D509 Iron deficiency anemia, unspecified: Secondary | ICD-10-CM | POA: Diagnosis not present

## 2014-06-10 DIAGNOSIS — N186 End stage renal disease: Secondary | ICD-10-CM | POA: Diagnosis not present

## 2014-06-12 DIAGNOSIS — N186 End stage renal disease: Secondary | ICD-10-CM | POA: Diagnosis not present

## 2014-06-12 DIAGNOSIS — D631 Anemia in chronic kidney disease: Secondary | ICD-10-CM | POA: Diagnosis not present

## 2014-06-12 DIAGNOSIS — N2581 Secondary hyperparathyroidism of renal origin: Secondary | ICD-10-CM | POA: Diagnosis not present

## 2014-06-15 DIAGNOSIS — N2581 Secondary hyperparathyroidism of renal origin: Secondary | ICD-10-CM | POA: Diagnosis not present

## 2014-06-15 DIAGNOSIS — N186 End stage renal disease: Secondary | ICD-10-CM | POA: Diagnosis not present

## 2014-06-15 DIAGNOSIS — I82409 Acute embolism and thrombosis of unspecified deep veins of unspecified lower extremity: Secondary | ICD-10-CM | POA: Diagnosis not present

## 2014-06-15 DIAGNOSIS — D631 Anemia in chronic kidney disease: Secondary | ICD-10-CM | POA: Diagnosis not present

## 2014-06-17 DIAGNOSIS — D631 Anemia in chronic kidney disease: Secondary | ICD-10-CM | POA: Diagnosis not present

## 2014-06-17 DIAGNOSIS — N186 End stage renal disease: Secondary | ICD-10-CM | POA: Diagnosis not present

## 2014-06-17 DIAGNOSIS — N2581 Secondary hyperparathyroidism of renal origin: Secondary | ICD-10-CM | POA: Diagnosis not present

## 2014-06-19 DIAGNOSIS — N2581 Secondary hyperparathyroidism of renal origin: Secondary | ICD-10-CM | POA: Diagnosis not present

## 2014-06-19 DIAGNOSIS — D631 Anemia in chronic kidney disease: Secondary | ICD-10-CM | POA: Diagnosis not present

## 2014-06-19 DIAGNOSIS — N186 End stage renal disease: Secondary | ICD-10-CM | POA: Diagnosis not present

## 2014-06-22 DIAGNOSIS — I82409 Acute embolism and thrombosis of unspecified deep veins of unspecified lower extremity: Secondary | ICD-10-CM | POA: Diagnosis not present

## 2014-06-22 DIAGNOSIS — N186 End stage renal disease: Secondary | ICD-10-CM | POA: Diagnosis not present

## 2014-06-22 DIAGNOSIS — N2581 Secondary hyperparathyroidism of renal origin: Secondary | ICD-10-CM | POA: Diagnosis not present

## 2014-06-22 DIAGNOSIS — D631 Anemia in chronic kidney disease: Secondary | ICD-10-CM | POA: Diagnosis not present

## 2014-06-24 DIAGNOSIS — N186 End stage renal disease: Secondary | ICD-10-CM | POA: Diagnosis not present

## 2014-06-24 DIAGNOSIS — D631 Anemia in chronic kidney disease: Secondary | ICD-10-CM | POA: Diagnosis not present

## 2014-06-24 DIAGNOSIS — N2581 Secondary hyperparathyroidism of renal origin: Secondary | ICD-10-CM | POA: Diagnosis not present

## 2014-06-26 DIAGNOSIS — N186 End stage renal disease: Secondary | ICD-10-CM | POA: Diagnosis not present

## 2014-06-26 DIAGNOSIS — N2581 Secondary hyperparathyroidism of renal origin: Secondary | ICD-10-CM | POA: Diagnosis not present

## 2014-06-26 DIAGNOSIS — D631 Anemia in chronic kidney disease: Secondary | ICD-10-CM | POA: Diagnosis not present

## 2014-06-29 DIAGNOSIS — N2581 Secondary hyperparathyroidism of renal origin: Secondary | ICD-10-CM | POA: Diagnosis not present

## 2014-06-29 DIAGNOSIS — D631 Anemia in chronic kidney disease: Secondary | ICD-10-CM | POA: Diagnosis not present

## 2014-06-29 DIAGNOSIS — N186 End stage renal disease: Secondary | ICD-10-CM | POA: Diagnosis not present

## 2014-07-01 DIAGNOSIS — D631 Anemia in chronic kidney disease: Secondary | ICD-10-CM | POA: Diagnosis not present

## 2014-07-01 DIAGNOSIS — N2581 Secondary hyperparathyroidism of renal origin: Secondary | ICD-10-CM | POA: Diagnosis not present

## 2014-07-01 DIAGNOSIS — N186 End stage renal disease: Secondary | ICD-10-CM | POA: Diagnosis not present

## 2014-07-02 DIAGNOSIS — N186 End stage renal disease: Secondary | ICD-10-CM | POA: Diagnosis not present

## 2014-07-02 DIAGNOSIS — Z992 Dependence on renal dialysis: Secondary | ICD-10-CM | POA: Diagnosis not present

## 2014-07-03 DIAGNOSIS — N186 End stage renal disease: Secondary | ICD-10-CM | POA: Diagnosis not present

## 2014-07-03 DIAGNOSIS — D509 Iron deficiency anemia, unspecified: Secondary | ICD-10-CM | POA: Diagnosis not present

## 2014-07-03 DIAGNOSIS — N2581 Secondary hyperparathyroidism of renal origin: Secondary | ICD-10-CM | POA: Diagnosis not present

## 2014-07-03 DIAGNOSIS — D631 Anemia in chronic kidney disease: Secondary | ICD-10-CM | POA: Diagnosis not present

## 2014-07-03 DIAGNOSIS — I82409 Acute embolism and thrombosis of unspecified deep veins of unspecified lower extremity: Secondary | ICD-10-CM | POA: Diagnosis not present

## 2014-07-08 DIAGNOSIS — I82409 Acute embolism and thrombosis of unspecified deep veins of unspecified lower extremity: Secondary | ICD-10-CM | POA: Diagnosis not present

## 2014-07-08 DIAGNOSIS — D631 Anemia in chronic kidney disease: Secondary | ICD-10-CM | POA: Diagnosis not present

## 2014-07-08 DIAGNOSIS — N186 End stage renal disease: Secondary | ICD-10-CM | POA: Diagnosis not present

## 2014-07-08 DIAGNOSIS — D509 Iron deficiency anemia, unspecified: Secondary | ICD-10-CM | POA: Diagnosis not present

## 2014-07-08 DIAGNOSIS — N2581 Secondary hyperparathyroidism of renal origin: Secondary | ICD-10-CM | POA: Diagnosis not present

## 2014-07-09 DIAGNOSIS — D510 Vitamin B12 deficiency anemia due to intrinsic factor deficiency: Secondary | ICD-10-CM | POA: Diagnosis not present

## 2014-07-10 DIAGNOSIS — N2581 Secondary hyperparathyroidism of renal origin: Secondary | ICD-10-CM | POA: Diagnosis not present

## 2014-07-10 DIAGNOSIS — D631 Anemia in chronic kidney disease: Secondary | ICD-10-CM | POA: Diagnosis not present

## 2014-07-10 DIAGNOSIS — D509 Iron deficiency anemia, unspecified: Secondary | ICD-10-CM | POA: Diagnosis not present

## 2014-07-10 DIAGNOSIS — N186 End stage renal disease: Secondary | ICD-10-CM | POA: Diagnosis not present

## 2014-07-13 DIAGNOSIS — D631 Anemia in chronic kidney disease: Secondary | ICD-10-CM | POA: Diagnosis not present

## 2014-07-13 DIAGNOSIS — N2581 Secondary hyperparathyroidism of renal origin: Secondary | ICD-10-CM | POA: Diagnosis not present

## 2014-07-13 DIAGNOSIS — N186 End stage renal disease: Secondary | ICD-10-CM | POA: Diagnosis not present

## 2014-07-13 DIAGNOSIS — I82409 Acute embolism and thrombosis of unspecified deep veins of unspecified lower extremity: Secondary | ICD-10-CM | POA: Diagnosis not present

## 2014-07-15 DIAGNOSIS — N2581 Secondary hyperparathyroidism of renal origin: Secondary | ICD-10-CM | POA: Diagnosis not present

## 2014-07-15 DIAGNOSIS — N186 End stage renal disease: Secondary | ICD-10-CM | POA: Diagnosis not present

## 2014-07-15 DIAGNOSIS — D631 Anemia in chronic kidney disease: Secondary | ICD-10-CM | POA: Diagnosis not present

## 2014-07-17 DIAGNOSIS — N2581 Secondary hyperparathyroidism of renal origin: Secondary | ICD-10-CM | POA: Diagnosis not present

## 2014-07-17 DIAGNOSIS — N186 End stage renal disease: Secondary | ICD-10-CM | POA: Diagnosis not present

## 2014-07-17 DIAGNOSIS — D631 Anemia in chronic kidney disease: Secondary | ICD-10-CM | POA: Diagnosis not present

## 2014-07-20 DIAGNOSIS — N2581 Secondary hyperparathyroidism of renal origin: Secondary | ICD-10-CM | POA: Diagnosis not present

## 2014-07-20 DIAGNOSIS — D631 Anemia in chronic kidney disease: Secondary | ICD-10-CM | POA: Diagnosis not present

## 2014-07-20 DIAGNOSIS — N186 End stage renal disease: Secondary | ICD-10-CM | POA: Diagnosis not present

## 2014-07-20 DIAGNOSIS — I82409 Acute embolism and thrombosis of unspecified deep veins of unspecified lower extremity: Secondary | ICD-10-CM | POA: Diagnosis not present

## 2014-07-22 DIAGNOSIS — N186 End stage renal disease: Secondary | ICD-10-CM | POA: Diagnosis not present

## 2014-07-22 DIAGNOSIS — N2581 Secondary hyperparathyroidism of renal origin: Secondary | ICD-10-CM | POA: Diagnosis not present

## 2014-07-22 DIAGNOSIS — D631 Anemia in chronic kidney disease: Secondary | ICD-10-CM | POA: Diagnosis not present

## 2014-07-24 DIAGNOSIS — D631 Anemia in chronic kidney disease: Secondary | ICD-10-CM | POA: Diagnosis not present

## 2014-07-24 DIAGNOSIS — N2581 Secondary hyperparathyroidism of renal origin: Secondary | ICD-10-CM | POA: Diagnosis not present

## 2014-07-24 DIAGNOSIS — N186 End stage renal disease: Secondary | ICD-10-CM | POA: Diagnosis not present

## 2014-07-27 DIAGNOSIS — D631 Anemia in chronic kidney disease: Secondary | ICD-10-CM | POA: Diagnosis not present

## 2014-07-27 DIAGNOSIS — I82409 Acute embolism and thrombosis of unspecified deep veins of unspecified lower extremity: Secondary | ICD-10-CM | POA: Diagnosis not present

## 2014-07-27 DIAGNOSIS — N186 End stage renal disease: Secondary | ICD-10-CM | POA: Diagnosis not present

## 2014-07-27 DIAGNOSIS — N2581 Secondary hyperparathyroidism of renal origin: Secondary | ICD-10-CM | POA: Diagnosis not present

## 2014-07-29 DIAGNOSIS — D631 Anemia in chronic kidney disease: Secondary | ICD-10-CM | POA: Diagnosis not present

## 2014-07-29 DIAGNOSIS — N2581 Secondary hyperparathyroidism of renal origin: Secondary | ICD-10-CM | POA: Diagnosis not present

## 2014-07-29 DIAGNOSIS — N186 End stage renal disease: Secondary | ICD-10-CM | POA: Diagnosis not present

## 2014-07-31 DIAGNOSIS — D631 Anemia in chronic kidney disease: Secondary | ICD-10-CM | POA: Diagnosis not present

## 2014-07-31 DIAGNOSIS — N186 End stage renal disease: Secondary | ICD-10-CM | POA: Diagnosis not present

## 2014-07-31 DIAGNOSIS — N2581 Secondary hyperparathyroidism of renal origin: Secondary | ICD-10-CM | POA: Diagnosis not present

## 2014-08-01 DIAGNOSIS — N186 End stage renal disease: Secondary | ICD-10-CM | POA: Diagnosis not present

## 2014-08-01 DIAGNOSIS — Z992 Dependence on renal dialysis: Secondary | ICD-10-CM | POA: Diagnosis not present

## 2014-08-03 DIAGNOSIS — N2581 Secondary hyperparathyroidism of renal origin: Secondary | ICD-10-CM | POA: Diagnosis not present

## 2014-08-03 DIAGNOSIS — D509 Iron deficiency anemia, unspecified: Secondary | ICD-10-CM | POA: Diagnosis not present

## 2014-08-03 DIAGNOSIS — T8249XA Other complication of vascular dialysis catheter, initial encounter: Secondary | ICD-10-CM | POA: Diagnosis not present

## 2014-08-03 DIAGNOSIS — N186 End stage renal disease: Secondary | ICD-10-CM | POA: Diagnosis not present

## 2014-08-03 DIAGNOSIS — D631 Anemia in chronic kidney disease: Secondary | ICD-10-CM | POA: Diagnosis not present

## 2014-08-03 DIAGNOSIS — I82409 Acute embolism and thrombosis of unspecified deep veins of unspecified lower extremity: Secondary | ICD-10-CM | POA: Diagnosis not present

## 2014-08-05 DIAGNOSIS — D631 Anemia in chronic kidney disease: Secondary | ICD-10-CM | POA: Diagnosis not present

## 2014-08-05 DIAGNOSIS — N2581 Secondary hyperparathyroidism of renal origin: Secondary | ICD-10-CM | POA: Diagnosis not present

## 2014-08-05 DIAGNOSIS — N186 End stage renal disease: Secondary | ICD-10-CM | POA: Diagnosis not present

## 2014-08-05 DIAGNOSIS — D509 Iron deficiency anemia, unspecified: Secondary | ICD-10-CM | POA: Diagnosis not present

## 2014-08-05 DIAGNOSIS — T8249XA Other complication of vascular dialysis catheter, initial encounter: Secondary | ICD-10-CM | POA: Diagnosis not present

## 2014-08-06 DIAGNOSIS — D510 Vitamin B12 deficiency anemia due to intrinsic factor deficiency: Secondary | ICD-10-CM | POA: Diagnosis not present

## 2014-08-07 DIAGNOSIS — D631 Anemia in chronic kidney disease: Secondary | ICD-10-CM | POA: Diagnosis not present

## 2014-08-07 DIAGNOSIS — D509 Iron deficiency anemia, unspecified: Secondary | ICD-10-CM | POA: Diagnosis not present

## 2014-08-07 DIAGNOSIS — N186 End stage renal disease: Secondary | ICD-10-CM | POA: Diagnosis not present

## 2014-08-07 DIAGNOSIS — N2581 Secondary hyperparathyroidism of renal origin: Secondary | ICD-10-CM | POA: Diagnosis not present

## 2014-08-07 DIAGNOSIS — T8249XA Other complication of vascular dialysis catheter, initial encounter: Secondary | ICD-10-CM | POA: Diagnosis not present

## 2014-08-10 DIAGNOSIS — D631 Anemia in chronic kidney disease: Secondary | ICD-10-CM | POA: Diagnosis not present

## 2014-08-10 DIAGNOSIS — D509 Iron deficiency anemia, unspecified: Secondary | ICD-10-CM | POA: Diagnosis not present

## 2014-08-10 DIAGNOSIS — N186 End stage renal disease: Secondary | ICD-10-CM | POA: Diagnosis not present

## 2014-08-10 DIAGNOSIS — N2581 Secondary hyperparathyroidism of renal origin: Secondary | ICD-10-CM | POA: Diagnosis not present

## 2014-08-14 DIAGNOSIS — I82409 Acute embolism and thrombosis of unspecified deep veins of unspecified lower extremity: Secondary | ICD-10-CM | POA: Diagnosis not present

## 2014-08-14 DIAGNOSIS — N186 End stage renal disease: Secondary | ICD-10-CM | POA: Diagnosis not present

## 2014-08-14 DIAGNOSIS — N2581 Secondary hyperparathyroidism of renal origin: Secondary | ICD-10-CM | POA: Diagnosis not present

## 2014-08-14 DIAGNOSIS — D631 Anemia in chronic kidney disease: Secondary | ICD-10-CM | POA: Diagnosis not present

## 2014-08-14 DIAGNOSIS — D509 Iron deficiency anemia, unspecified: Secondary | ICD-10-CM | POA: Diagnosis not present

## 2014-08-17 DIAGNOSIS — D631 Anemia in chronic kidney disease: Secondary | ICD-10-CM | POA: Diagnosis not present

## 2014-08-17 DIAGNOSIS — D509 Iron deficiency anemia, unspecified: Secondary | ICD-10-CM | POA: Diagnosis not present

## 2014-08-17 DIAGNOSIS — N2581 Secondary hyperparathyroidism of renal origin: Secondary | ICD-10-CM | POA: Diagnosis not present

## 2014-08-17 DIAGNOSIS — N186 End stage renal disease: Secondary | ICD-10-CM | POA: Diagnosis not present

## 2014-08-17 DIAGNOSIS — I82409 Acute embolism and thrombosis of unspecified deep veins of unspecified lower extremity: Secondary | ICD-10-CM | POA: Diagnosis not present

## 2014-08-21 DIAGNOSIS — N2581 Secondary hyperparathyroidism of renal origin: Secondary | ICD-10-CM | POA: Diagnosis not present

## 2014-08-21 DIAGNOSIS — D631 Anemia in chronic kidney disease: Secondary | ICD-10-CM | POA: Diagnosis not present

## 2014-08-21 DIAGNOSIS — N186 End stage renal disease: Secondary | ICD-10-CM | POA: Diagnosis not present

## 2014-08-21 DIAGNOSIS — D509 Iron deficiency anemia, unspecified: Secondary | ICD-10-CM | POA: Diagnosis not present

## 2014-08-24 DIAGNOSIS — D631 Anemia in chronic kidney disease: Secondary | ICD-10-CM | POA: Diagnosis not present

## 2014-08-24 DIAGNOSIS — D509 Iron deficiency anemia, unspecified: Secondary | ICD-10-CM | POA: Diagnosis not present

## 2014-08-24 DIAGNOSIS — N186 End stage renal disease: Secondary | ICD-10-CM | POA: Diagnosis not present

## 2014-08-24 DIAGNOSIS — N2581 Secondary hyperparathyroidism of renal origin: Secondary | ICD-10-CM | POA: Diagnosis not present

## 2014-08-24 DIAGNOSIS — I82409 Acute embolism and thrombosis of unspecified deep veins of unspecified lower extremity: Secondary | ICD-10-CM | POA: Diagnosis not present

## 2014-08-28 DIAGNOSIS — D509 Iron deficiency anemia, unspecified: Secondary | ICD-10-CM | POA: Diagnosis not present

## 2014-08-28 DIAGNOSIS — N2581 Secondary hyperparathyroidism of renal origin: Secondary | ICD-10-CM | POA: Diagnosis not present

## 2014-08-28 DIAGNOSIS — N186 End stage renal disease: Secondary | ICD-10-CM | POA: Diagnosis not present

## 2014-08-28 DIAGNOSIS — D631 Anemia in chronic kidney disease: Secondary | ICD-10-CM | POA: Diagnosis not present

## 2014-08-31 DIAGNOSIS — D509 Iron deficiency anemia, unspecified: Secondary | ICD-10-CM | POA: Diagnosis not present

## 2014-08-31 DIAGNOSIS — D631 Anemia in chronic kidney disease: Secondary | ICD-10-CM | POA: Diagnosis not present

## 2014-08-31 DIAGNOSIS — N2581 Secondary hyperparathyroidism of renal origin: Secondary | ICD-10-CM | POA: Diagnosis not present

## 2014-08-31 DIAGNOSIS — N186 End stage renal disease: Secondary | ICD-10-CM | POA: Diagnosis not present

## 2014-09-01 DIAGNOSIS — N186 End stage renal disease: Secondary | ICD-10-CM | POA: Diagnosis not present

## 2014-09-01 DIAGNOSIS — Z992 Dependence on renal dialysis: Secondary | ICD-10-CM | POA: Diagnosis not present

## 2014-09-03 DIAGNOSIS — D510 Vitamin B12 deficiency anemia due to intrinsic factor deficiency: Secondary | ICD-10-CM | POA: Diagnosis not present

## 2014-09-04 DIAGNOSIS — N186 End stage renal disease: Secondary | ICD-10-CM | POA: Diagnosis not present

## 2014-09-04 DIAGNOSIS — Z992 Dependence on renal dialysis: Secondary | ICD-10-CM | POA: Diagnosis not present

## 2014-09-04 DIAGNOSIS — D631 Anemia in chronic kidney disease: Secondary | ICD-10-CM | POA: Diagnosis not present

## 2014-09-04 DIAGNOSIS — I82409 Acute embolism and thrombosis of unspecified deep veins of unspecified lower extremity: Secondary | ICD-10-CM | POA: Diagnosis not present

## 2014-09-04 DIAGNOSIS — M79601 Pain in right arm: Secondary | ICD-10-CM | POA: Diagnosis not present

## 2014-09-04 DIAGNOSIS — M7989 Other specified soft tissue disorders: Secondary | ICD-10-CM | POA: Diagnosis not present

## 2014-09-04 DIAGNOSIS — D509 Iron deficiency anemia, unspecified: Secondary | ICD-10-CM | POA: Diagnosis not present

## 2014-09-04 DIAGNOSIS — N2581 Secondary hyperparathyroidism of renal origin: Secondary | ICD-10-CM | POA: Diagnosis not present

## 2014-09-07 DIAGNOSIS — D509 Iron deficiency anemia, unspecified: Secondary | ICD-10-CM | POA: Diagnosis not present

## 2014-09-07 DIAGNOSIS — D631 Anemia in chronic kidney disease: Secondary | ICD-10-CM | POA: Diagnosis not present

## 2014-09-07 DIAGNOSIS — N186 End stage renal disease: Secondary | ICD-10-CM | POA: Diagnosis not present

## 2014-09-07 DIAGNOSIS — I82409 Acute embolism and thrombosis of unspecified deep veins of unspecified lower extremity: Secondary | ICD-10-CM | POA: Diagnosis not present

## 2014-09-07 DIAGNOSIS — N2581 Secondary hyperparathyroidism of renal origin: Secondary | ICD-10-CM | POA: Diagnosis not present

## 2014-09-11 DIAGNOSIS — D631 Anemia in chronic kidney disease: Secondary | ICD-10-CM | POA: Diagnosis not present

## 2014-09-11 DIAGNOSIS — N2581 Secondary hyperparathyroidism of renal origin: Secondary | ICD-10-CM | POA: Diagnosis not present

## 2014-09-11 DIAGNOSIS — D509 Iron deficiency anemia, unspecified: Secondary | ICD-10-CM | POA: Diagnosis not present

## 2014-09-11 DIAGNOSIS — N186 End stage renal disease: Secondary | ICD-10-CM | POA: Diagnosis not present

## 2014-09-14 DIAGNOSIS — D631 Anemia in chronic kidney disease: Secondary | ICD-10-CM | POA: Diagnosis not present

## 2014-09-14 DIAGNOSIS — N186 End stage renal disease: Secondary | ICD-10-CM | POA: Diagnosis not present

## 2014-09-14 DIAGNOSIS — N2581 Secondary hyperparathyroidism of renal origin: Secondary | ICD-10-CM | POA: Diagnosis not present

## 2014-09-14 DIAGNOSIS — I82409 Acute embolism and thrombosis of unspecified deep veins of unspecified lower extremity: Secondary | ICD-10-CM | POA: Diagnosis not present

## 2014-09-18 DIAGNOSIS — D631 Anemia in chronic kidney disease: Secondary | ICD-10-CM | POA: Diagnosis not present

## 2014-09-18 DIAGNOSIS — N186 End stage renal disease: Secondary | ICD-10-CM | POA: Diagnosis not present

## 2014-09-18 DIAGNOSIS — N2581 Secondary hyperparathyroidism of renal origin: Secondary | ICD-10-CM | POA: Diagnosis not present

## 2014-09-21 DIAGNOSIS — I82409 Acute embolism and thrombosis of unspecified deep veins of unspecified lower extremity: Secondary | ICD-10-CM | POA: Diagnosis not present

## 2014-09-21 DIAGNOSIS — D631 Anemia in chronic kidney disease: Secondary | ICD-10-CM | POA: Diagnosis not present

## 2014-09-21 DIAGNOSIS — N186 End stage renal disease: Secondary | ICD-10-CM | POA: Diagnosis not present

## 2014-09-21 DIAGNOSIS — N2581 Secondary hyperparathyroidism of renal origin: Secondary | ICD-10-CM | POA: Diagnosis not present

## 2014-09-25 DIAGNOSIS — Z23 Encounter for immunization: Secondary | ICD-10-CM | POA: Diagnosis not present

## 2014-09-25 DIAGNOSIS — D631 Anemia in chronic kidney disease: Secondary | ICD-10-CM | POA: Diagnosis not present

## 2014-09-25 DIAGNOSIS — N2581 Secondary hyperparathyroidism of renal origin: Secondary | ICD-10-CM | POA: Diagnosis not present

## 2014-09-25 DIAGNOSIS — N186 End stage renal disease: Secondary | ICD-10-CM | POA: Diagnosis not present

## 2014-09-28 DIAGNOSIS — Z23 Encounter for immunization: Secondary | ICD-10-CM | POA: Diagnosis not present

## 2014-09-28 DIAGNOSIS — N2581 Secondary hyperparathyroidism of renal origin: Secondary | ICD-10-CM | POA: Diagnosis not present

## 2014-09-28 DIAGNOSIS — N186 End stage renal disease: Secondary | ICD-10-CM | POA: Diagnosis not present

## 2014-09-28 DIAGNOSIS — D631 Anemia in chronic kidney disease: Secondary | ICD-10-CM | POA: Diagnosis not present

## 2014-10-01 DIAGNOSIS — Z992 Dependence on renal dialysis: Secondary | ICD-10-CM | POA: Diagnosis not present

## 2014-10-01 DIAGNOSIS — D510 Vitamin B12 deficiency anemia due to intrinsic factor deficiency: Secondary | ICD-10-CM | POA: Diagnosis not present

## 2014-10-01 DIAGNOSIS — N186 End stage renal disease: Secondary | ICD-10-CM | POA: Diagnosis not present

## 2014-10-02 DIAGNOSIS — D509 Iron deficiency anemia, unspecified: Secondary | ICD-10-CM | POA: Diagnosis not present

## 2014-10-02 DIAGNOSIS — N2581 Secondary hyperparathyroidism of renal origin: Secondary | ICD-10-CM | POA: Diagnosis not present

## 2014-10-02 DIAGNOSIS — N186 End stage renal disease: Secondary | ICD-10-CM | POA: Diagnosis not present

## 2014-10-02 DIAGNOSIS — D631 Anemia in chronic kidney disease: Secondary | ICD-10-CM | POA: Diagnosis not present

## 2014-10-05 DIAGNOSIS — N2581 Secondary hyperparathyroidism of renal origin: Secondary | ICD-10-CM | POA: Diagnosis not present

## 2014-10-05 DIAGNOSIS — D631 Anemia in chronic kidney disease: Secondary | ICD-10-CM | POA: Diagnosis not present

## 2014-10-05 DIAGNOSIS — D509 Iron deficiency anemia, unspecified: Secondary | ICD-10-CM | POA: Diagnosis not present

## 2014-10-05 DIAGNOSIS — I82409 Acute embolism and thrombosis of unspecified deep veins of unspecified lower extremity: Secondary | ICD-10-CM | POA: Diagnosis not present

## 2014-10-05 DIAGNOSIS — N186 End stage renal disease: Secondary | ICD-10-CM | POA: Diagnosis not present

## 2014-10-09 DIAGNOSIS — N2581 Secondary hyperparathyroidism of renal origin: Secondary | ICD-10-CM | POA: Diagnosis not present

## 2014-10-09 DIAGNOSIS — D509 Iron deficiency anemia, unspecified: Secondary | ICD-10-CM | POA: Diagnosis not present

## 2014-10-09 DIAGNOSIS — D631 Anemia in chronic kidney disease: Secondary | ICD-10-CM | POA: Diagnosis not present

## 2014-10-09 DIAGNOSIS — N186 End stage renal disease: Secondary | ICD-10-CM | POA: Diagnosis not present

## 2014-10-12 DIAGNOSIS — N2581 Secondary hyperparathyroidism of renal origin: Secondary | ICD-10-CM | POA: Diagnosis not present

## 2014-10-12 DIAGNOSIS — N186 End stage renal disease: Secondary | ICD-10-CM | POA: Diagnosis not present

## 2014-10-12 DIAGNOSIS — D509 Iron deficiency anemia, unspecified: Secondary | ICD-10-CM | POA: Diagnosis not present

## 2014-10-12 DIAGNOSIS — D631 Anemia in chronic kidney disease: Secondary | ICD-10-CM | POA: Diagnosis not present

## 2014-10-16 DIAGNOSIS — D509 Iron deficiency anemia, unspecified: Secondary | ICD-10-CM | POA: Diagnosis not present

## 2014-10-16 DIAGNOSIS — N2581 Secondary hyperparathyroidism of renal origin: Secondary | ICD-10-CM | POA: Diagnosis not present

## 2014-10-16 DIAGNOSIS — D631 Anemia in chronic kidney disease: Secondary | ICD-10-CM | POA: Diagnosis not present

## 2014-10-16 DIAGNOSIS — N186 End stage renal disease: Secondary | ICD-10-CM | POA: Diagnosis not present

## 2014-10-16 DIAGNOSIS — I82409 Acute embolism and thrombosis of unspecified deep veins of unspecified lower extremity: Secondary | ICD-10-CM | POA: Diagnosis not present

## 2014-10-19 DIAGNOSIS — N2581 Secondary hyperparathyroidism of renal origin: Secondary | ICD-10-CM | POA: Diagnosis not present

## 2014-10-19 DIAGNOSIS — N186 End stage renal disease: Secondary | ICD-10-CM | POA: Diagnosis not present

## 2014-10-19 DIAGNOSIS — I82409 Acute embolism and thrombosis of unspecified deep veins of unspecified lower extremity: Secondary | ICD-10-CM | POA: Diagnosis not present

## 2014-10-19 DIAGNOSIS — D509 Iron deficiency anemia, unspecified: Secondary | ICD-10-CM | POA: Diagnosis not present

## 2014-10-19 DIAGNOSIS — D631 Anemia in chronic kidney disease: Secondary | ICD-10-CM | POA: Diagnosis not present

## 2014-10-23 DIAGNOSIS — D509 Iron deficiency anemia, unspecified: Secondary | ICD-10-CM | POA: Diagnosis not present

## 2014-10-23 DIAGNOSIS — D631 Anemia in chronic kidney disease: Secondary | ICD-10-CM | POA: Diagnosis not present

## 2014-10-23 DIAGNOSIS — N186 End stage renal disease: Secondary | ICD-10-CM | POA: Diagnosis not present

## 2014-10-23 DIAGNOSIS — N2581 Secondary hyperparathyroidism of renal origin: Secondary | ICD-10-CM | POA: Diagnosis not present

## 2014-10-26 DIAGNOSIS — D509 Iron deficiency anemia, unspecified: Secondary | ICD-10-CM | POA: Diagnosis not present

## 2014-10-26 DIAGNOSIS — I82409 Acute embolism and thrombosis of unspecified deep veins of unspecified lower extremity: Secondary | ICD-10-CM | POA: Diagnosis not present

## 2014-10-26 DIAGNOSIS — N2581 Secondary hyperparathyroidism of renal origin: Secondary | ICD-10-CM | POA: Diagnosis not present

## 2014-10-26 DIAGNOSIS — D631 Anemia in chronic kidney disease: Secondary | ICD-10-CM | POA: Diagnosis not present

## 2014-10-26 DIAGNOSIS — N186 End stage renal disease: Secondary | ICD-10-CM | POA: Diagnosis not present

## 2014-10-29 DIAGNOSIS — D510 Vitamin B12 deficiency anemia due to intrinsic factor deficiency: Secondary | ICD-10-CM | POA: Diagnosis not present

## 2014-10-30 DIAGNOSIS — N186 End stage renal disease: Secondary | ICD-10-CM | POA: Diagnosis not present

## 2014-10-30 DIAGNOSIS — N2581 Secondary hyperparathyroidism of renal origin: Secondary | ICD-10-CM | POA: Diagnosis not present

## 2014-10-30 DIAGNOSIS — D509 Iron deficiency anemia, unspecified: Secondary | ICD-10-CM | POA: Diagnosis not present

## 2014-10-30 DIAGNOSIS — D631 Anemia in chronic kidney disease: Secondary | ICD-10-CM | POA: Diagnosis not present

## 2014-11-01 DIAGNOSIS — N186 End stage renal disease: Secondary | ICD-10-CM | POA: Diagnosis not present

## 2014-11-01 DIAGNOSIS — Z992 Dependence on renal dialysis: Secondary | ICD-10-CM | POA: Diagnosis not present

## 2014-11-02 DIAGNOSIS — N186 End stage renal disease: Secondary | ICD-10-CM | POA: Diagnosis not present

## 2014-11-02 DIAGNOSIS — D509 Iron deficiency anemia, unspecified: Secondary | ICD-10-CM | POA: Diagnosis not present

## 2014-11-02 DIAGNOSIS — N2581 Secondary hyperparathyroidism of renal origin: Secondary | ICD-10-CM | POA: Diagnosis not present

## 2014-11-02 DIAGNOSIS — I82409 Acute embolism and thrombosis of unspecified deep veins of unspecified lower extremity: Secondary | ICD-10-CM | POA: Diagnosis not present

## 2014-11-02 DIAGNOSIS — D631 Anemia in chronic kidney disease: Secondary | ICD-10-CM | POA: Diagnosis not present

## 2014-11-04 DIAGNOSIS — H527 Unspecified disorder of refraction: Secondary | ICD-10-CM | POA: Diagnosis not present

## 2014-11-04 DIAGNOSIS — H01001 Unspecified blepharitis right upper eyelid: Secondary | ICD-10-CM | POA: Diagnosis not present

## 2014-11-04 DIAGNOSIS — Z961 Presence of intraocular lens: Secondary | ICD-10-CM | POA: Diagnosis not present

## 2014-11-04 DIAGNOSIS — H02831 Dermatochalasis of right upper eyelid: Secondary | ICD-10-CM | POA: Diagnosis not present

## 2014-11-04 DIAGNOSIS — H43813 Vitreous degeneration, bilateral: Secondary | ICD-10-CM | POA: Diagnosis not present

## 2014-11-04 DIAGNOSIS — H26493 Other secondary cataract, bilateral: Secondary | ICD-10-CM | POA: Diagnosis not present

## 2014-11-06 DIAGNOSIS — D509 Iron deficiency anemia, unspecified: Secondary | ICD-10-CM | POA: Diagnosis not present

## 2014-11-06 DIAGNOSIS — N186 End stage renal disease: Secondary | ICD-10-CM | POA: Diagnosis not present

## 2014-11-06 DIAGNOSIS — N2581 Secondary hyperparathyroidism of renal origin: Secondary | ICD-10-CM | POA: Diagnosis not present

## 2014-11-06 DIAGNOSIS — D631 Anemia in chronic kidney disease: Secondary | ICD-10-CM | POA: Diagnosis not present

## 2014-11-09 DIAGNOSIS — N186 End stage renal disease: Secondary | ICD-10-CM | POA: Diagnosis not present

## 2014-11-09 DIAGNOSIS — I82409 Acute embolism and thrombosis of unspecified deep veins of unspecified lower extremity: Secondary | ICD-10-CM | POA: Diagnosis not present

## 2014-11-09 DIAGNOSIS — D631 Anemia in chronic kidney disease: Secondary | ICD-10-CM | POA: Diagnosis not present

## 2014-11-09 DIAGNOSIS — D509 Iron deficiency anemia, unspecified: Secondary | ICD-10-CM | POA: Diagnosis not present

## 2014-11-09 DIAGNOSIS — N2581 Secondary hyperparathyroidism of renal origin: Secondary | ICD-10-CM | POA: Diagnosis not present

## 2014-11-13 DIAGNOSIS — N2581 Secondary hyperparathyroidism of renal origin: Secondary | ICD-10-CM | POA: Diagnosis not present

## 2014-11-13 DIAGNOSIS — D631 Anemia in chronic kidney disease: Secondary | ICD-10-CM | POA: Diagnosis not present

## 2014-11-13 DIAGNOSIS — N186 End stage renal disease: Secondary | ICD-10-CM | POA: Diagnosis not present

## 2014-11-16 DIAGNOSIS — D631 Anemia in chronic kidney disease: Secondary | ICD-10-CM | POA: Diagnosis not present

## 2014-11-16 DIAGNOSIS — N186 End stage renal disease: Secondary | ICD-10-CM | POA: Diagnosis not present

## 2014-11-16 DIAGNOSIS — I82409 Acute embolism and thrombosis of unspecified deep veins of unspecified lower extremity: Secondary | ICD-10-CM | POA: Diagnosis not present

## 2014-11-16 DIAGNOSIS — N2581 Secondary hyperparathyroidism of renal origin: Secondary | ICD-10-CM | POA: Diagnosis not present

## 2014-11-20 DIAGNOSIS — N186 End stage renal disease: Secondary | ICD-10-CM | POA: Diagnosis not present

## 2014-11-20 DIAGNOSIS — D631 Anemia in chronic kidney disease: Secondary | ICD-10-CM | POA: Diagnosis not present

## 2014-11-20 DIAGNOSIS — N2581 Secondary hyperparathyroidism of renal origin: Secondary | ICD-10-CM | POA: Diagnosis not present

## 2014-11-23 DIAGNOSIS — I82409 Acute embolism and thrombosis of unspecified deep veins of unspecified lower extremity: Secondary | ICD-10-CM | POA: Diagnosis not present

## 2014-11-23 DIAGNOSIS — N186 End stage renal disease: Secondary | ICD-10-CM | POA: Diagnosis not present

## 2014-11-23 DIAGNOSIS — D631 Anemia in chronic kidney disease: Secondary | ICD-10-CM | POA: Diagnosis not present

## 2014-11-23 DIAGNOSIS — N2581 Secondary hyperparathyroidism of renal origin: Secondary | ICD-10-CM | POA: Diagnosis not present

## 2014-11-26 DIAGNOSIS — D510 Vitamin B12 deficiency anemia due to intrinsic factor deficiency: Secondary | ICD-10-CM | POA: Diagnosis not present

## 2014-11-27 DIAGNOSIS — N186 End stage renal disease: Secondary | ICD-10-CM | POA: Diagnosis not present

## 2014-11-27 DIAGNOSIS — D631 Anemia in chronic kidney disease: Secondary | ICD-10-CM | POA: Diagnosis not present

## 2014-11-27 DIAGNOSIS — N2581 Secondary hyperparathyroidism of renal origin: Secondary | ICD-10-CM | POA: Diagnosis not present

## 2014-11-30 DIAGNOSIS — N2581 Secondary hyperparathyroidism of renal origin: Secondary | ICD-10-CM | POA: Diagnosis not present

## 2014-11-30 DIAGNOSIS — N186 End stage renal disease: Secondary | ICD-10-CM | POA: Diagnosis not present

## 2014-11-30 DIAGNOSIS — D631 Anemia in chronic kidney disease: Secondary | ICD-10-CM | POA: Diagnosis not present

## 2014-12-02 DIAGNOSIS — Z992 Dependence on renal dialysis: Secondary | ICD-10-CM | POA: Diagnosis not present

## 2014-12-02 DIAGNOSIS — N186 End stage renal disease: Secondary | ICD-10-CM | POA: Diagnosis not present

## 2014-12-03 ENCOUNTER — Ambulatory Visit (INDEPENDENT_AMBULATORY_CARE_PROVIDER_SITE_OTHER): Payer: Medicare Other | Admitting: Family Medicine

## 2014-12-03 ENCOUNTER — Encounter: Payer: Self-pay | Admitting: Family Medicine

## 2014-12-03 VITALS — BP 136/65 | HR 74 | Temp 97.9°F | Wt 102.5 lb

## 2014-12-03 DIAGNOSIS — D692 Other nonthrombocytopenic purpura: Secondary | ICD-10-CM

## 2014-12-03 DIAGNOSIS — D510 Vitamin B12 deficiency anemia due to intrinsic factor deficiency: Secondary | ICD-10-CM | POA: Diagnosis not present

## 2014-12-03 DIAGNOSIS — N186 End stage renal disease: Secondary | ICD-10-CM | POA: Diagnosis not present

## 2014-12-03 DIAGNOSIS — K912 Postsurgical malabsorption, not elsewhere classified: Secondary | ICD-10-CM

## 2014-12-03 DIAGNOSIS — Z992 Dependence on renal dialysis: Secondary | ICD-10-CM

## 2014-12-03 NOTE — Progress Notes (Signed)
   Subjective:    Patient ID: Gabrielle Stafford, female    DOB: 1929/04/18, 81 y.o.   MRN: [id number]  HPI  Has been getting B12 injection with Dr. Georgiann Cocker for years.  Says the price/cost her went up and wanted to see if it would be less expensive coming through her primary care provider. She actually see get some of her B12 injections here periodically..    She is now on dialysis for chronic kidney disease secondary to acute renal failure. She is doing this 2 days of week.  Says her bowels have been some better but still hs diarrhea often.  Says the day after her dialysis she has horrible diarrhea.  He takes about 2 days for her to rebound from this and eat a normal diet. She's really being careful about her intake. She takes 3 times with breakfast and 2 with lunch and dinner.  C/o of bruising and skin tears on her forearms. She says she doesn't like it because it looks bad. They have attempted 3 fistulas and they have failed.   Review of Systems     Objective:   Physical Exam  Constitutional: She is oriented to person, place, and time. She appears well-developed and well-nourished.  HENT:  Head: Normocephalic and atraumatic.  Eyes: Conjunctivae and EOM are normal.  Cardiovascular: Normal rate.   Pulmonary/Chest: Effort normal.  Neurological: She is alert and oriented to person, place, and time.  Skin: Skin is dry. No pallor.  Psychiatric: She has a normal mood and affect. Her behavior is normal.          Assessment & Plan:  Pernicous anemia - will start doing b12 here. She's not due for an injection today so we'll schedule that next 1 based on her last office visit. She typically gets them every 4 weeks. She also had a B12  level checked recently   CKD requiring Dialysis , secondary to acute renal failure-doing well with dialysis and only having to go twice a week as long she manages her diet and intake well.  Short gut syndrome-she is doing better than when I last saw her a year  ago. So far she's been managing it well and has actually gained some weight which is fantastic.  Senile purpura-gave reassurance that the bruises on her forearms are actually normal because of the aging process.

## 2014-12-04 ENCOUNTER — Encounter: Payer: Self-pay | Admitting: Family Medicine

## 2014-12-04 DIAGNOSIS — N186 End stage renal disease: Secondary | ICD-10-CM | POA: Diagnosis not present

## 2014-12-04 DIAGNOSIS — N2581 Secondary hyperparathyroidism of renal origin: Secondary | ICD-10-CM | POA: Diagnosis not present

## 2014-12-04 DIAGNOSIS — Z95828 Presence of other vascular implants and grafts: Secondary | ICD-10-CM | POA: Insufficient documentation

## 2014-12-04 DIAGNOSIS — D631 Anemia in chronic kidney disease: Secondary | ICD-10-CM | POA: Diagnosis not present

## 2014-12-04 DIAGNOSIS — D509 Iron deficiency anemia, unspecified: Secondary | ICD-10-CM | POA: Diagnosis not present

## 2014-12-04 DIAGNOSIS — I82409 Acute embolism and thrombosis of unspecified deep veins of unspecified lower extremity: Secondary | ICD-10-CM | POA: Diagnosis not present

## 2014-12-07 DIAGNOSIS — N186 End stage renal disease: Secondary | ICD-10-CM | POA: Diagnosis not present

## 2014-12-07 DIAGNOSIS — D631 Anemia in chronic kidney disease: Secondary | ICD-10-CM | POA: Diagnosis not present

## 2014-12-07 DIAGNOSIS — D509 Iron deficiency anemia, unspecified: Secondary | ICD-10-CM | POA: Diagnosis not present

## 2014-12-07 DIAGNOSIS — N2581 Secondary hyperparathyroidism of renal origin: Secondary | ICD-10-CM | POA: Diagnosis not present

## 2014-12-11 DIAGNOSIS — I82409 Acute embolism and thrombosis of unspecified deep veins of unspecified lower extremity: Secondary | ICD-10-CM | POA: Diagnosis not present

## 2014-12-11 DIAGNOSIS — N2581 Secondary hyperparathyroidism of renal origin: Secondary | ICD-10-CM | POA: Diagnosis not present

## 2014-12-11 DIAGNOSIS — D631 Anemia in chronic kidney disease: Secondary | ICD-10-CM | POA: Diagnosis not present

## 2014-12-11 DIAGNOSIS — D509 Iron deficiency anemia, unspecified: Secondary | ICD-10-CM | POA: Diagnosis not present

## 2014-12-11 DIAGNOSIS — N186 End stage renal disease: Secondary | ICD-10-CM | POA: Diagnosis not present

## 2014-12-14 DIAGNOSIS — D631 Anemia in chronic kidney disease: Secondary | ICD-10-CM | POA: Diagnosis not present

## 2014-12-14 DIAGNOSIS — N186 End stage renal disease: Secondary | ICD-10-CM | POA: Diagnosis not present

## 2014-12-14 DIAGNOSIS — N2581 Secondary hyperparathyroidism of renal origin: Secondary | ICD-10-CM | POA: Diagnosis not present

## 2014-12-14 DIAGNOSIS — Z23 Encounter for immunization: Secondary | ICD-10-CM | POA: Diagnosis not present

## 2014-12-14 DIAGNOSIS — I82409 Acute embolism and thrombosis of unspecified deep veins of unspecified lower extremity: Secondary | ICD-10-CM | POA: Diagnosis not present

## 2014-12-18 DIAGNOSIS — N2581 Secondary hyperparathyroidism of renal origin: Secondary | ICD-10-CM | POA: Diagnosis not present

## 2014-12-18 DIAGNOSIS — N186 End stage renal disease: Secondary | ICD-10-CM | POA: Diagnosis not present

## 2014-12-18 DIAGNOSIS — Z23 Encounter for immunization: Secondary | ICD-10-CM | POA: Diagnosis not present

## 2014-12-18 DIAGNOSIS — D631 Anemia in chronic kidney disease: Secondary | ICD-10-CM | POA: Diagnosis not present

## 2014-12-21 DIAGNOSIS — Z23 Encounter for immunization: Secondary | ICD-10-CM | POA: Diagnosis not present

## 2014-12-21 DIAGNOSIS — I82409 Acute embolism and thrombosis of unspecified deep veins of unspecified lower extremity: Secondary | ICD-10-CM | POA: Diagnosis not present

## 2014-12-21 DIAGNOSIS — N186 End stage renal disease: Secondary | ICD-10-CM | POA: Diagnosis not present

## 2014-12-21 DIAGNOSIS — D631 Anemia in chronic kidney disease: Secondary | ICD-10-CM | POA: Diagnosis not present

## 2014-12-21 DIAGNOSIS — N2581 Secondary hyperparathyroidism of renal origin: Secondary | ICD-10-CM | POA: Diagnosis not present

## 2014-12-25 DIAGNOSIS — N2581 Secondary hyperparathyroidism of renal origin: Secondary | ICD-10-CM | POA: Diagnosis not present

## 2014-12-25 DIAGNOSIS — N186 End stage renal disease: Secondary | ICD-10-CM | POA: Diagnosis not present

## 2014-12-25 DIAGNOSIS — D631 Anemia in chronic kidney disease: Secondary | ICD-10-CM | POA: Diagnosis not present

## 2014-12-28 DIAGNOSIS — N186 End stage renal disease: Secondary | ICD-10-CM | POA: Diagnosis not present

## 2014-12-28 DIAGNOSIS — D631 Anemia in chronic kidney disease: Secondary | ICD-10-CM | POA: Diagnosis not present

## 2014-12-28 DIAGNOSIS — I82409 Acute embolism and thrombosis of unspecified deep veins of unspecified lower extremity: Secondary | ICD-10-CM | POA: Diagnosis not present

## 2014-12-28 DIAGNOSIS — N2581 Secondary hyperparathyroidism of renal origin: Secondary | ICD-10-CM | POA: Diagnosis not present

## 2014-12-31 ENCOUNTER — Ambulatory Visit (INDEPENDENT_AMBULATORY_CARE_PROVIDER_SITE_OTHER): Payer: Medicare Other | Admitting: Family Medicine

## 2014-12-31 VITALS — BP 129/57 | HR 90

## 2014-12-31 DIAGNOSIS — D510 Vitamin B12 deficiency anemia due to intrinsic factor deficiency: Secondary | ICD-10-CM | POA: Diagnosis not present

## 2014-12-31 MED ORDER — CYANOCOBALAMIN 1000 MCG/ML IJ SOLN
1000.0000 ug | Freq: Once | INTRAMUSCULAR | Status: AC
  Administered 2014-12-31: 1000 ug via INTRAMUSCULAR

## 2014-12-31 NOTE — Progress Notes (Signed)
   Subjective:    Patient ID: Gabrielle Stafford, female    DOB: [birth year], 81 y.o.   MRN: [id number]  HPI  Patient is here for a vitamin B 12 injection.  Denies gastrointestinal problems or dizziness.  Review of Systems     Objective:   Physical Exam        Assessment & Plan:   Patient tolerated injection well without complications.  Patient advised to schedule next injection in 30 days.

## 2015-01-01 DIAGNOSIS — Z992 Dependence on renal dialysis: Secondary | ICD-10-CM | POA: Diagnosis not present

## 2015-01-01 DIAGNOSIS — N186 End stage renal disease: Secondary | ICD-10-CM | POA: Diagnosis not present

## 2015-01-01 DIAGNOSIS — D631 Anemia in chronic kidney disease: Secondary | ICD-10-CM | POA: Diagnosis not present

## 2015-01-01 DIAGNOSIS — N2581 Secondary hyperparathyroidism of renal origin: Secondary | ICD-10-CM | POA: Diagnosis not present

## 2015-01-04 DIAGNOSIS — N2581 Secondary hyperparathyroidism of renal origin: Secondary | ICD-10-CM | POA: Diagnosis not present

## 2015-01-04 DIAGNOSIS — I82409 Acute embolism and thrombosis of unspecified deep veins of unspecified lower extremity: Secondary | ICD-10-CM | POA: Diagnosis not present

## 2015-01-04 DIAGNOSIS — D631 Anemia in chronic kidney disease: Secondary | ICD-10-CM | POA: Diagnosis not present

## 2015-01-04 DIAGNOSIS — N186 End stage renal disease: Secondary | ICD-10-CM | POA: Diagnosis not present

## 2015-01-04 DIAGNOSIS — D509 Iron deficiency anemia, unspecified: Secondary | ICD-10-CM | POA: Diagnosis not present

## 2015-01-08 DIAGNOSIS — D631 Anemia in chronic kidney disease: Secondary | ICD-10-CM | POA: Diagnosis not present

## 2015-01-08 DIAGNOSIS — N186 End stage renal disease: Secondary | ICD-10-CM | POA: Diagnosis not present

## 2015-01-08 DIAGNOSIS — D509 Iron deficiency anemia, unspecified: Secondary | ICD-10-CM | POA: Diagnosis not present

## 2015-01-08 DIAGNOSIS — N2581 Secondary hyperparathyroidism of renal origin: Secondary | ICD-10-CM | POA: Diagnosis not present

## 2015-01-11 DIAGNOSIS — D509 Iron deficiency anemia, unspecified: Secondary | ICD-10-CM | POA: Diagnosis not present

## 2015-01-11 DIAGNOSIS — D631 Anemia in chronic kidney disease: Secondary | ICD-10-CM | POA: Diagnosis not present

## 2015-01-11 DIAGNOSIS — I82409 Acute embolism and thrombosis of unspecified deep veins of unspecified lower extremity: Secondary | ICD-10-CM | POA: Diagnosis not present

## 2015-01-11 DIAGNOSIS — N186 End stage renal disease: Secondary | ICD-10-CM | POA: Diagnosis not present

## 2015-01-11 DIAGNOSIS — N2581 Secondary hyperparathyroidism of renal origin: Secondary | ICD-10-CM | POA: Diagnosis not present

## 2015-01-15 DIAGNOSIS — N186 End stage renal disease: Secondary | ICD-10-CM | POA: Diagnosis not present

## 2015-01-15 DIAGNOSIS — N2581 Secondary hyperparathyroidism of renal origin: Secondary | ICD-10-CM | POA: Diagnosis not present

## 2015-01-15 DIAGNOSIS — D631 Anemia in chronic kidney disease: Secondary | ICD-10-CM | POA: Diagnosis not present

## 2015-01-18 DIAGNOSIS — N186 End stage renal disease: Secondary | ICD-10-CM | POA: Diagnosis not present

## 2015-01-18 DIAGNOSIS — D631 Anemia in chronic kidney disease: Secondary | ICD-10-CM | POA: Diagnosis not present

## 2015-01-18 DIAGNOSIS — N2581 Secondary hyperparathyroidism of renal origin: Secondary | ICD-10-CM | POA: Diagnosis not present

## 2015-01-18 DIAGNOSIS — I82409 Acute embolism and thrombosis of unspecified deep veins of unspecified lower extremity: Secondary | ICD-10-CM | POA: Diagnosis not present

## 2015-01-22 DIAGNOSIS — N186 End stage renal disease: Secondary | ICD-10-CM | POA: Diagnosis not present

## 2015-01-22 DIAGNOSIS — N2581 Secondary hyperparathyroidism of renal origin: Secondary | ICD-10-CM | POA: Diagnosis not present

## 2015-01-22 DIAGNOSIS — D631 Anemia in chronic kidney disease: Secondary | ICD-10-CM | POA: Diagnosis not present

## 2015-01-25 DIAGNOSIS — N2581 Secondary hyperparathyroidism of renal origin: Secondary | ICD-10-CM | POA: Diagnosis not present

## 2015-01-25 DIAGNOSIS — N186 End stage renal disease: Secondary | ICD-10-CM | POA: Diagnosis not present

## 2015-01-25 DIAGNOSIS — D631 Anemia in chronic kidney disease: Secondary | ICD-10-CM | POA: Diagnosis not present

## 2015-01-25 DIAGNOSIS — I82409 Acute embolism and thrombosis of unspecified deep veins of unspecified lower extremity: Secondary | ICD-10-CM | POA: Diagnosis not present

## 2015-01-28 ENCOUNTER — Ambulatory Visit (INDEPENDENT_AMBULATORY_CARE_PROVIDER_SITE_OTHER): Payer: Medicare Other | Admitting: Family Medicine

## 2015-01-28 VITALS — BP 126/66 | HR 82 | Wt 101.0 lb

## 2015-01-28 DIAGNOSIS — D510 Vitamin B12 deficiency anemia due to intrinsic factor deficiency: Secondary | ICD-10-CM | POA: Diagnosis not present

## 2015-01-28 MED ORDER — CYANOCOBALAMIN 1000 MCG/ML IJ SOLN
1000.0000 ug | Freq: Once | INTRAMUSCULAR | Status: AC
  Administered 2015-01-28: 1000 ug via INTRAMUSCULAR

## 2015-01-28 NOTE — Progress Notes (Signed)
   Subjective:    Patient ID: Gabrielle Stafford, female    DOB: 1930-03-23, 81 y.o.   MRN: [id number]  HPI    Review of Systems     Objective:   Physical Exam        Assessment & Plan:  Agree with below.  Beatrice Lecher, MD

## 2015-01-28 NOTE — Progress Notes (Signed)
Patient came into clinic today for her monthly B12 injection. Pt tolerated injection in left deltoid well with no immediate complications. Advised to schedule her next appointment, verbalized understanding.

## 2015-01-29 DIAGNOSIS — D631 Anemia in chronic kidney disease: Secondary | ICD-10-CM | POA: Diagnosis not present

## 2015-01-29 DIAGNOSIS — N2581 Secondary hyperparathyroidism of renal origin: Secondary | ICD-10-CM | POA: Diagnosis not present

## 2015-01-29 DIAGNOSIS — N186 End stage renal disease: Secondary | ICD-10-CM | POA: Diagnosis not present

## 2015-02-01 DIAGNOSIS — N186 End stage renal disease: Secondary | ICD-10-CM | POA: Diagnosis not present

## 2015-02-01 DIAGNOSIS — D631 Anemia in chronic kidney disease: Secondary | ICD-10-CM | POA: Diagnosis not present

## 2015-02-01 DIAGNOSIS — I82409 Acute embolism and thrombosis of unspecified deep veins of unspecified lower extremity: Secondary | ICD-10-CM | POA: Diagnosis not present

## 2015-02-01 DIAGNOSIS — Z992 Dependence on renal dialysis: Secondary | ICD-10-CM | POA: Diagnosis not present

## 2015-02-01 DIAGNOSIS — N2581 Secondary hyperparathyroidism of renal origin: Secondary | ICD-10-CM | POA: Diagnosis not present

## 2015-02-05 DIAGNOSIS — D631 Anemia in chronic kidney disease: Secondary | ICD-10-CM | POA: Diagnosis not present

## 2015-02-05 DIAGNOSIS — D509 Iron deficiency anemia, unspecified: Secondary | ICD-10-CM | POA: Diagnosis not present

## 2015-02-05 DIAGNOSIS — N186 End stage renal disease: Secondary | ICD-10-CM | POA: Diagnosis not present

## 2015-02-05 DIAGNOSIS — N2581 Secondary hyperparathyroidism of renal origin: Secondary | ICD-10-CM | POA: Diagnosis not present

## 2015-02-08 DIAGNOSIS — D509 Iron deficiency anemia, unspecified: Secondary | ICD-10-CM | POA: Diagnosis not present

## 2015-02-08 DIAGNOSIS — D631 Anemia in chronic kidney disease: Secondary | ICD-10-CM | POA: Diagnosis not present

## 2015-02-08 DIAGNOSIS — N2581 Secondary hyperparathyroidism of renal origin: Secondary | ICD-10-CM | POA: Diagnosis not present

## 2015-02-08 DIAGNOSIS — I82409 Acute embolism and thrombosis of unspecified deep veins of unspecified lower extremity: Secondary | ICD-10-CM | POA: Diagnosis not present

## 2015-02-08 DIAGNOSIS — N186 End stage renal disease: Secondary | ICD-10-CM | POA: Diagnosis not present

## 2015-02-11 ENCOUNTER — Ambulatory Visit (INDEPENDENT_AMBULATORY_CARE_PROVIDER_SITE_OTHER): Payer: Medicare Other | Admitting: Family Medicine

## 2015-02-11 ENCOUNTER — Encounter: Payer: Self-pay | Admitting: Family Medicine

## 2015-02-11 VITALS — BP 130/55 | HR 79 | Temp 98.3°F | Wt 100.8 lb

## 2015-02-11 DIAGNOSIS — J209 Acute bronchitis, unspecified: Secondary | ICD-10-CM | POA: Diagnosis not present

## 2015-02-11 DIAGNOSIS — H6121 Impacted cerumen, right ear: Secondary | ICD-10-CM | POA: Diagnosis not present

## 2015-02-11 MED ORDER — PREDNISONE 20 MG PO TABS: 40.0000 mg | ORAL_TABLET | Freq: Every day | ORAL | Status: DC

## 2015-02-11 MED ORDER — HYDROCODONE-ACETAMINOPHEN 5-325 MG PO TABS: 0.5000 | ORAL_TABLET | Freq: Every evening | ORAL | Status: DC | PRN

## 2015-02-11 NOTE — Patient Instructions (Signed)

## 2015-02-11 NOTE — Progress Notes (Signed)
   Subjective:    Patient ID: Gabrielle Stafford, female    DOB: 1929-10-10, 81 y.o.   MRN: [id number]  HPI She was at dialysis on Monday and was with a friend who had a cough.  The next day her eyes and nose were running and she started coughing.  Face feels swollen.  Cough is productive.  No wheezing.  She feels congested too.   She says that today as she was giving out of the car she suddenly lost hearing in her right ear. No pain or drainage. No fevers chills or sweats.   Review of Systems     Objective:   Physical Exam  Constitutional: She is oriented to person, place, and time. She appears well-developed and well-nourished.  HENT:  Head: Normocephalic and atraumatic.  Right Ear: External ear normal.  Left Ear: External ear normal.  Nose: Nose normal.  Mouth/Throat: Oropharynx is clear and moist.  TMs and canals are clear.   Eyes: Conjunctivae and EOM are normal. Pupils are equal, round, and reactive to light.  Neck: Neck supple. No thyromegaly present.  Cardiovascular: Normal rate, regular rhythm and normal heart sounds.   Pulmonary/Chest: Effort normal and breath sounds normal. She has no wheezes.  Lymphadenopathy:    She has no cervical adenopathy.  Neurological: She is alert and oriented to person, place, and time.  Skin: Skin is warm and dry.  Psychiatric: She has a normal mood and affect.          Assessment & Plan:  Most consistent with viral upper respiratory infection. She's had symptoms for 2-3 days at this point. Recommend symptomatic care. I did give her a small quantity of hydrocodone to use just at bedtime for the cough since it is keeping her awake at night. She says she's on getting about hour of sleep. She is not to Make an there are no abnormal chest sounds on exam. And no fever which is very reassuring. If she feels like she's getting worse then please call his back. I did put her on prednisone for 5 days to help with her symptoms as well.  Cerumen  impaction, right ear-ear irrigated today. Patient tolerated well. Tympanic membrane appears to be normal on exam.

## 2015-02-15 DIAGNOSIS — D631 Anemia in chronic kidney disease: Secondary | ICD-10-CM | POA: Diagnosis not present

## 2015-02-15 DIAGNOSIS — N186 End stage renal disease: Secondary | ICD-10-CM | POA: Diagnosis not present

## 2015-02-15 DIAGNOSIS — I82409 Acute embolism and thrombosis of unspecified deep veins of unspecified lower extremity: Secondary | ICD-10-CM | POA: Diagnosis not present

## 2015-02-15 DIAGNOSIS — N2581 Secondary hyperparathyroidism of renal origin: Secondary | ICD-10-CM | POA: Diagnosis not present

## 2015-02-17 ENCOUNTER — Encounter: Payer: Self-pay | Admitting: Physician Assistant

## 2015-02-17 ENCOUNTER — Ambulatory Visit (INDEPENDENT_AMBULATORY_CARE_PROVIDER_SITE_OTHER): Payer: Medicare Other | Admitting: Physician Assistant

## 2015-02-17 VITALS — BP 118/48 | HR 82 | Ht 62.0 in | Wt 99.0 lb

## 2015-02-17 DIAGNOSIS — H6591 Unspecified nonsuppurative otitis media, right ear: Secondary | ICD-10-CM | POA: Diagnosis not present

## 2015-02-17 DIAGNOSIS — Z992 Dependence on renal dialysis: Secondary | ICD-10-CM | POA: Diagnosis not present

## 2015-02-17 DIAGNOSIS — N186 End stage renal disease: Secondary | ICD-10-CM | POA: Diagnosis not present

## 2015-02-17 MED ORDER — AMOXICILLIN 500 MG PO CAPS: 500.0000 mg | ORAL_CAPSULE | Freq: Every day | ORAL | Status: DC

## 2015-02-17 NOTE — Progress Notes (Signed)
   Subjective:    Patient ID: Gabrielle Stafford, female    DOB: 1929-06-14, 81 y.o.   MRN: [id number]  HPI  Patient is an 81 year old female who presents to the clinic with her son. She complains of worsening right ear pain, sinus pressure and hearing loss. She does have an ongoing history of stage IV renal disease and has dialysis regularly. She was seen by Dr. Charise Carwin on 02/11/2015. She felt most of her symptoms were viral. She did take prednisone with little relief. She never took hydrocodone for cough. Cough has improved. No fever or chills.  She did have some cerumen impaction that was resolved with irrigation. She continues to have worsening pain, sinus pressure and headaches.  Review of Systems  All other systems reviewed and are negative.      Objective:   Physical Exam  Constitutional: She is oriented to person, place, and time. She appears well-developed and well-nourished.  HENT:  Head: Normocephalic and atraumatic.  Right Ear: External ear normal.  Left Ear: External ear normal.  Nose: Nose normal.  Mouth/Throat: Oropharynx is clear and moist. No oropharyngeal exudate.  External bilateral ears are clear. There is no cerumen. Right TM appears dull, bony landmarks are not visible. There is no light reflex. There is also an area at 10-12oclock that sher very erythematous.   Left TM is dull with outside erythema. Light reflex is present.  Eyes: Conjunctivae are normal.  Neck: Normal range of motion. Neck supple.  Cardiovascular: Normal rate, regular rhythm and normal heart sounds.   Pulmonary/Chest: Effort normal and breath sounds normal. She has no wheezes.  Neurological: She is alert and oriented to person, place, and time.  Psychiatric: She has a normal mood and affect. Her behavior is normal.          Assessment & Plan:  Right otitis media/stage IV renal disease-I chose amoxicillin with consultation with Dr. Charise Carwin. 500 mg once daily for 10 days was sent to the  pharmacy. She was instructed to follow-up in one week for recheck with her PCP.

## 2015-02-19 DIAGNOSIS — N2581 Secondary hyperparathyroidism of renal origin: Secondary | ICD-10-CM | POA: Diagnosis not present

## 2015-02-19 DIAGNOSIS — D631 Anemia in chronic kidney disease: Secondary | ICD-10-CM | POA: Diagnosis not present

## 2015-02-19 DIAGNOSIS — N186 End stage renal disease: Secondary | ICD-10-CM | POA: Diagnosis not present

## 2015-02-23 DIAGNOSIS — D509 Iron deficiency anemia, unspecified: Secondary | ICD-10-CM | POA: Diagnosis not present

## 2015-02-23 DIAGNOSIS — D631 Anemia in chronic kidney disease: Secondary | ICD-10-CM | POA: Diagnosis not present

## 2015-02-23 DIAGNOSIS — N186 End stage renal disease: Secondary | ICD-10-CM | POA: Diagnosis not present

## 2015-02-23 DIAGNOSIS — N2581 Secondary hyperparathyroidism of renal origin: Secondary | ICD-10-CM | POA: Diagnosis not present

## 2015-02-24 ENCOUNTER — Ambulatory Visit: Payer: Medicare Other

## 2015-02-24 ENCOUNTER — Encounter: Payer: Self-pay | Admitting: Osteopathic Medicine

## 2015-02-24 ENCOUNTER — Ambulatory Visit (INDEPENDENT_AMBULATORY_CARE_PROVIDER_SITE_OTHER): Payer: Medicare Other | Admitting: Osteopathic Medicine

## 2015-02-24 VITALS — BP 106/39 | HR 80

## 2015-02-24 DIAGNOSIS — D510 Vitamin B12 deficiency anemia due to intrinsic factor deficiency: Secondary | ICD-10-CM

## 2015-02-24 DIAGNOSIS — H65193 Other acute nonsuppurative otitis media, bilateral: Secondary | ICD-10-CM | POA: Diagnosis not present

## 2015-02-24 DIAGNOSIS — H905 Unspecified sensorineural hearing loss: Secondary | ICD-10-CM | POA: Diagnosis not present

## 2015-02-24 MED ORDER — AMOXICILLIN-POT CLAVULANATE 500-125 MG PO TABS: 1.0000 | ORAL_TABLET | Freq: Every day | ORAL | Status: DC

## 2015-02-24 MED ORDER — CYANOCOBALAMIN 1000 MCG/ML IJ SOLN
1000.0000 ug | Freq: Once | INTRAMUSCULAR | Status: AC
  Administered 2015-02-24: 1000 ug via INTRAMUSCULAR

## 2015-02-24 NOTE — Progress Notes (Signed)
HPI: Gabrielle Stafford is a 81 y.o. female who presents to [place]  today for chief complaint of:  Chief Complaint  Patient presents with  . Ear Fullness   . Location: BILATERAL EARS . Quality: CAN'T HEAR HARDLY ANYTHING . Duration: since treated few weeks ago for bronchitis, worse last week, treated by [facility] for bilateral otitis media . Timing: constant . Context: see above, recently treated for OM, no improvement in hearing . Modifying factors: on Amoxicillin for OM, pt on HD . Assoc signs/symptoms: no headache or vision changes   Past medical, social and family history reviewed: Past Medical History  Diagnosis Date  . Hypokalemia   . DVT (deep venous thrombosis) ([place])   . PE (pulmonary thromboembolism) ([place])   . Stented coronary artery   . Short gut syndrome   . Arthritis   . Cardiac arrest ([place])   . Cancer ([place])   . Hip fracture [facility])     Left   Past Surgical History  Procedure Laterality Date  . Cataract extraction, bilateral  1999  . Cholecystectomy  1999  . Colon surgery  1999    colon cancer   Social History  Substance Use Topics  . Smoking status: Former Smoker    Types: Cigarettes    Quit date: 04/03/1997  . Smokeless tobacco: Not on file  . Alcohol Use: No   Family History  Problem Relation Age of Onset  . Alcohol abuse Father   . Gout Father   . Heart attack Father     brothers  . Diabetes Father   . Hypertension Father   . Cancer Father     bladder tumor  . Colon cancer Mother     colon  . Pulmonary embolism Mother     leg amputation  . Depression Mother   . Hyperlipidemia Mother   . Diabetes Sister   . Breast cancer Sister   . Arthritis      aunt    Current Outpatient Prescriptions  Medication Sig Dispense Refill  . Acetaminophen 650 MG TABS Take 1 tablet by mouth as needed.    Marland Kitchen amoxicillin (AMOXIL) 500 MG capsule Take 1 capsule (500 mg total) by mouth daily. For 10 days. 10 capsule 0  . calcium  carbonate (TUMS - DOSED IN MG ELEMENTAL CALCIUM) 500 MG chewable tablet Chew 2-3 tablets by mouth 3 (three) times daily after meals.     . cholecalciferol (VITAMIN D) 1000 UNITS tablet Take 1,000 Units by mouth.    . cyanocobalamin (,VITAMIN B-12,) 1000 MCG/ML injection Inject 1,000 mcg into the muscle every 30 (thirty) days.    . folic acid-vitamin b complex-vitamin c-selenium-zinc (DIALYVITE) 3 MG TABS tablet Take 1 tablet by mouth.    . magnesium gluconate (MAGONATE) 500 MG tablet Take 250 mg by mouth 2 (two) times daily.    Marland Kitchen warfarin (COUMADIN) 2 MG tablet TAKE 1 TABLET DAILY. 90 tablet 3   No current facility-administered medications for this visit.   Allergies  Allergen Reactions  . Alendronate Sodium   . Azithromycin   . Ciprofloxacin   . Nitrofurantoin   . Sulfamethoxazole-Trimethoprim   . Tramadol Hcl     REACTION: nausea  . Keflex [Cephalexin] Itching and Rash      Review of Systems: CONSTITUTIONAL:  No  fever, no chills, No  unintentional weight changes HEAD/EYES/EARS/NOSE/THROAT: No headache, no vision change, (+) hearing change, No  sore throat CARDIAC: No chest pain, no pressure/palpitations, no orthopnea  RESPIRATORY: No  cough, No  shortness of breath/wheeze NEUROLOGIC: No weakness, no dizziness, no slurred speech  Exam:  BP 106/39 mmHg  Pulse 80  SpO2 100% Constitutional: VSS, see above. General Appearance: alert, well-developed, well-nourished, NAD Eyes: Normal lids and conjunctive, non-icteric sclera, PERRLA Ears, Nose, Mouth, Throat: Normal external inspection ears/nares/mouth/lips/gums, TM abnormal: Right TM appears dull, no bulging TM, (+) erythema, no light reflex.  L TM dull but not as severe, (+) light reflex. Jade double- checked these since she saw patient last week, she states ears appear a bit worse, more red. MMM, posterior pharynx No  erythema No  exudate Respiratory: Normal respiratory effort. no wheeze, no rhonchi, no rales Cardiovascular: S1/S2  normal, no murmur, no rub/gallop auscultated. RRR.  Neurological: No cranial nerve deficit on limited exam. Motor and sensation intact and symmetric  WEBER: PATIENT'S RESULTS: CANNOT HEAR AT ALL RINNE:  PATIENT'S RESULTS: L: BONE CONDUCTION < AIR CONDUCTION = NORMAL R: BONE CONDUCTION < AIR CONDUCTION = NORMAL CAN HEAR A BIT BETTER ON LEFT SIDE    ASSESSMENT/PLAN:  Sensorineural hearing loss - Plan: Ambulatory referral to Audiology, may consider canceling this appointment if patient's hearing improved after treatment of otitis media  Subacute nonsuppurative otitis media of both ears - stop amoxicillin and switch to Augmentin, using caution and patient on dialysis will do low-dose daily, son is advised not to give patient antibiotic just prior to hemodialysis, await until after so that is not filtered out. Follow-up with Jade in 1 week for recheck of ears, , may consider ENT referral, as above we have placed referral for audiology  Pernicious anemia - Plan: cyanocobalamin ((VITAMIN B-12)) injection 1,000 mcg    Return in about 1 week (around 03/03/2015) for RECHECK [place].

## 2015-02-26 DIAGNOSIS — D631 Anemia in chronic kidney disease: Secondary | ICD-10-CM | POA: Diagnosis not present

## 2015-02-26 DIAGNOSIS — N186 End stage renal disease: Secondary | ICD-10-CM | POA: Diagnosis not present

## 2015-02-26 DIAGNOSIS — D509 Iron deficiency anemia, unspecified: Secondary | ICD-10-CM | POA: Diagnosis not present

## 2015-02-26 DIAGNOSIS — N2581 Secondary hyperparathyroidism of renal origin: Secondary | ICD-10-CM | POA: Diagnosis not present

## 2015-03-01 DIAGNOSIS — D509 Iron deficiency anemia, unspecified: Secondary | ICD-10-CM | POA: Diagnosis not present

## 2015-03-01 DIAGNOSIS — N2581 Secondary hyperparathyroidism of renal origin: Secondary | ICD-10-CM | POA: Diagnosis not present

## 2015-03-01 DIAGNOSIS — D631 Anemia in chronic kidney disease: Secondary | ICD-10-CM | POA: Diagnosis not present

## 2015-03-01 DIAGNOSIS — N186 End stage renal disease: Secondary | ICD-10-CM | POA: Diagnosis not present

## 2015-03-01 DIAGNOSIS — I82409 Acute embolism and thrombosis of unspecified deep veins of unspecified lower extremity: Secondary | ICD-10-CM | POA: Diagnosis not present

## 2015-03-03 ENCOUNTER — Ambulatory Visit (INDEPENDENT_AMBULATORY_CARE_PROVIDER_SITE_OTHER): Payer: Medicare Other | Admitting: Physician Assistant

## 2015-03-03 ENCOUNTER — Encounter: Payer: Self-pay | Admitting: Physician Assistant

## 2015-03-03 VITALS — BP 106/62 | HR 86 | Ht 62.0 in | Wt 91.0 lb

## 2015-03-03 DIAGNOSIS — I959 Hypotension, unspecified: Secondary | ICD-10-CM | POA: Diagnosis not present

## 2015-03-03 DIAGNOSIS — N186 End stage renal disease: Secondary | ICD-10-CM | POA: Diagnosis not present

## 2015-03-03 DIAGNOSIS — Z992 Dependence on renal dialysis: Secondary | ICD-10-CM | POA: Diagnosis not present

## 2015-03-03 DIAGNOSIS — H6693 Otitis media, unspecified, bilateral: Secondary | ICD-10-CM | POA: Diagnosis not present

## 2015-03-03 DIAGNOSIS — H905 Unspecified sensorineural hearing loss: Secondary | ICD-10-CM

## 2015-03-03 DIAGNOSIS — H6690 Otitis media, unspecified, unspecified ear: Secondary | ICD-10-CM | POA: Insufficient documentation

## 2015-03-03 NOTE — Progress Notes (Signed)
   Subjective:    Patient ID: Gabrielle Stafford, female    DOB: 11/20/1929, 81 y.o.   MRN: [id number]  HPI Pt presents to the clinic for 1 week recheck after finishing amoxicillin and augmentin for bilateral otitis media and hearing loss. She did not finish complete dose of augmentin due to increased diarrhea and stomach upset. She denies any worsening pain. She will occasionally feel a twinge of pain in either ear. She has almost no hearing but may be a little in left than right. No fever, chills, headaches. No cough, chills, ST, or sinus pressure.    Review of Systems  All other systems reviewed and are negative.      Objective:   Physical Exam  Constitutional: She is oriented to person, place, and time. She appears well-developed and well-nourished.  HENT:  Head: Normocephalic and atraumatic.  Nose: Nose normal.  Mouth/Throat: Oropharynx is clear and moist. No oropharyngeal exudate.    Right TM very dark, no light reflex, no erythema, very dull, cannot view ossicles. No pus or blood noted.   Left TM was able to see light reflex and the outline of ossicles. Seemed more buldging. No blood or pus.   Cardiovascular: Regular rhythm and normal heart sounds.   Pulmonary/Chest: Effort normal and breath sounds normal.  Neurological: She is alert and oriented to person, place, and time.  Psychiatric: She has a normal mood and affect. Her behavior is normal.    Weber: no hearing.  Rinne: AC greater than BC bilateral. With left being a little longer.      Assessment & Plan:  Sensorineural hearing loss/bilateral otitis media- ears appear less red but TM continues to look abnormal and dull. She still has signifcant hearing loss. Referral made to [facility] on dec. 6th at [facility] office. Can call every day for cancellations. No medication changes made today.   Hypotension- increased while in office to 106/62. Pt is asymptomatic and walked into office. She has just left dialysis and was given a bag of  fluids. Encouraged pt to stay hydrated today and make sure she eats something. She has also not eaten today.

## 2015-03-05 DIAGNOSIS — Z85038 Personal history of other malignant neoplasm of large intestine: Secondary | ICD-10-CM | POA: Diagnosis not present

## 2015-03-05 DIAGNOSIS — Z882 Allergy status to sulfonamides status: Secondary | ICD-10-CM | POA: Diagnosis not present

## 2015-03-05 DIAGNOSIS — J449 Chronic obstructive pulmonary disease, unspecified: Secondary | ICD-10-CM | POA: Diagnosis not present

## 2015-03-05 DIAGNOSIS — Z86711 Personal history of pulmonary embolism: Secondary | ICD-10-CM | POA: Diagnosis not present

## 2015-03-05 DIAGNOSIS — E876 Hypokalemia: Secondary | ICD-10-CM | POA: Diagnosis not present

## 2015-03-05 DIAGNOSIS — R531 Weakness: Secondary | ICD-10-CM | POA: Diagnosis not present

## 2015-03-05 DIAGNOSIS — R197 Diarrhea, unspecified: Secondary | ICD-10-CM | POA: Diagnosis not present

## 2015-03-05 DIAGNOSIS — Z8709 Personal history of other diseases of the respiratory system: Secondary | ICD-10-CM | POA: Diagnosis not present

## 2015-03-05 DIAGNOSIS — Z886 Allergy status to analgesic agent status: Secondary | ICD-10-CM | POA: Diagnosis not present

## 2015-03-05 DIAGNOSIS — E785 Hyperlipidemia, unspecified: Secondary | ICD-10-CM | POA: Diagnosis not present

## 2015-03-05 DIAGNOSIS — Z888 Allergy status to other drugs, medicaments and biological substances status: Secondary | ICD-10-CM | POA: Diagnosis not present

## 2015-03-05 DIAGNOSIS — Z881 Allergy status to other antibiotic agents status: Secondary | ICD-10-CM | POA: Diagnosis not present

## 2015-03-05 DIAGNOSIS — Z95828 Presence of other vascular implants and grafts: Secondary | ICD-10-CM | POA: Diagnosis not present

## 2015-03-05 DIAGNOSIS — N186 End stage renal disease: Secondary | ICD-10-CM | POA: Diagnosis not present

## 2015-03-05 DIAGNOSIS — Z992 Dependence on renal dialysis: Secondary | ICD-10-CM | POA: Diagnosis not present

## 2015-03-05 DIAGNOSIS — Z681 Body mass index (BMI) 19 or less, adult: Secondary | ICD-10-CM | POA: Diagnosis not present

## 2015-03-05 DIAGNOSIS — Z87891 Personal history of nicotine dependence: Secondary | ICD-10-CM | POA: Diagnosis not present

## 2015-03-05 DIAGNOSIS — R079 Chest pain, unspecified: Secondary | ICD-10-CM | POA: Diagnosis not present

## 2015-03-05 DIAGNOSIS — Z86718 Personal history of other venous thrombosis and embolism: Secondary | ICD-10-CM | POA: Diagnosis not present

## 2015-03-05 DIAGNOSIS — R630 Anorexia: Secondary | ICD-10-CM | POA: Diagnosis not present

## 2015-03-05 DIAGNOSIS — D649 Anemia, unspecified: Secondary | ICD-10-CM | POA: Diagnosis not present

## 2015-03-05 DIAGNOSIS — M810 Age-related osteoporosis without current pathological fracture: Secondary | ICD-10-CM | POA: Diagnosis not present

## 2015-03-05 DIAGNOSIS — R001 Bradycardia, unspecified: Secondary | ICD-10-CM | POA: Diagnosis not present

## 2015-03-05 DIAGNOSIS — Z7901 Long term (current) use of anticoagulants: Secondary | ICD-10-CM | POA: Diagnosis not present

## 2015-03-05 DIAGNOSIS — K219 Gastro-esophageal reflux disease without esophagitis: Secondary | ICD-10-CM | POA: Diagnosis not present

## 2015-03-06 DIAGNOSIS — N2581 Secondary hyperparathyroidism of renal origin: Secondary | ICD-10-CM | POA: Diagnosis not present

## 2015-03-06 DIAGNOSIS — D631 Anemia in chronic kidney disease: Secondary | ICD-10-CM | POA: Diagnosis not present

## 2015-03-06 DIAGNOSIS — D509 Iron deficiency anemia, unspecified: Secondary | ICD-10-CM | POA: Diagnosis not present

## 2015-03-06 DIAGNOSIS — N186 End stage renal disease: Secondary | ICD-10-CM | POA: Diagnosis not present

## 2015-03-08 DIAGNOSIS — D631 Anemia in chronic kidney disease: Secondary | ICD-10-CM | POA: Diagnosis not present

## 2015-03-08 DIAGNOSIS — N186 End stage renal disease: Secondary | ICD-10-CM | POA: Diagnosis not present

## 2015-03-08 DIAGNOSIS — N2581 Secondary hyperparathyroidism of renal origin: Secondary | ICD-10-CM | POA: Diagnosis not present

## 2015-03-08 DIAGNOSIS — I82409 Acute embolism and thrombosis of unspecified deep veins of unspecified lower extremity: Secondary | ICD-10-CM | POA: Diagnosis not present

## 2015-03-08 DIAGNOSIS — D509 Iron deficiency anemia, unspecified: Secondary | ICD-10-CM | POA: Diagnosis not present

## 2015-03-09 DIAGNOSIS — Z87891 Personal history of nicotine dependence: Secondary | ICD-10-CM | POA: Diagnosis not present

## 2015-03-09 DIAGNOSIS — H6593 Unspecified nonsuppurative otitis media, bilateral: Secondary | ICD-10-CM | POA: Diagnosis not present

## 2015-03-09 DIAGNOSIS — H6503 Acute serous otitis media, bilateral: Secondary | ICD-10-CM | POA: Diagnosis not present

## 2015-03-12 DIAGNOSIS — D509 Iron deficiency anemia, unspecified: Secondary | ICD-10-CM | POA: Diagnosis not present

## 2015-03-12 DIAGNOSIS — D631 Anemia in chronic kidney disease: Secondary | ICD-10-CM | POA: Diagnosis not present

## 2015-03-12 DIAGNOSIS — N2581 Secondary hyperparathyroidism of renal origin: Secondary | ICD-10-CM | POA: Diagnosis not present

## 2015-03-12 DIAGNOSIS — N186 End stage renal disease: Secondary | ICD-10-CM | POA: Diagnosis not present

## 2015-03-15 DIAGNOSIS — N2581 Secondary hyperparathyroidism of renal origin: Secondary | ICD-10-CM | POA: Diagnosis not present

## 2015-03-15 DIAGNOSIS — N186 End stage renal disease: Secondary | ICD-10-CM | POA: Diagnosis not present

## 2015-03-15 DIAGNOSIS — D631 Anemia in chronic kidney disease: Secondary | ICD-10-CM | POA: Diagnosis not present

## 2015-03-15 DIAGNOSIS — I82409 Acute embolism and thrombosis of unspecified deep veins of unspecified lower extremity: Secondary | ICD-10-CM | POA: Diagnosis not present

## 2015-03-19 DIAGNOSIS — D631 Anemia in chronic kidney disease: Secondary | ICD-10-CM | POA: Diagnosis not present

## 2015-03-19 DIAGNOSIS — N2581 Secondary hyperparathyroidism of renal origin: Secondary | ICD-10-CM | POA: Diagnosis not present

## 2015-03-19 DIAGNOSIS — N186 End stage renal disease: Secondary | ICD-10-CM | POA: Diagnosis not present

## 2015-03-22 DIAGNOSIS — D631 Anemia in chronic kidney disease: Secondary | ICD-10-CM | POA: Diagnosis not present

## 2015-03-22 DIAGNOSIS — N2581 Secondary hyperparathyroidism of renal origin: Secondary | ICD-10-CM | POA: Diagnosis not present

## 2015-03-22 DIAGNOSIS — N186 End stage renal disease: Secondary | ICD-10-CM | POA: Diagnosis not present

## 2015-03-24 ENCOUNTER — Ambulatory Visit: Payer: Medicare Other

## 2015-03-25 ENCOUNTER — Ambulatory Visit (INDEPENDENT_AMBULATORY_CARE_PROVIDER_SITE_OTHER): Payer: Medicare Other | Admitting: Sports Medicine

## 2015-03-25 VITALS — BP 115/60 | HR 74

## 2015-03-25 DIAGNOSIS — D510 Vitamin B12 deficiency anemia due to intrinsic factor deficiency: Secondary | ICD-10-CM

## 2015-03-25 MED ORDER — CYANOCOBALAMIN 1000 MCG/ML IJ SOLN
1000.0000 ug | Freq: Once | INTRAMUSCULAR | Status: AC
  Administered 2015-03-25: 1000 ug via INTRAMUSCULAR

## 2015-03-25 NOTE — Progress Notes (Signed)
   Subjective:    Patient ID: Gabrielle Stafford, female    DOB: 07-11-1929, 81 y.o.   MRN: [id number] B12 injection given RD. No complications.  Beatris Ship, CMA HPI    Review of Systems     Objective:   Physical Exam        Assessment & Plan:

## 2015-03-26 DIAGNOSIS — I82409 Acute embolism and thrombosis of unspecified deep veins of unspecified lower extremity: Secondary | ICD-10-CM | POA: Diagnosis not present

## 2015-03-26 DIAGNOSIS — Z1159 Encounter for screening for other viral diseases: Secondary | ICD-10-CM | POA: Diagnosis not present

## 2015-03-26 DIAGNOSIS — N186 End stage renal disease: Secondary | ICD-10-CM | POA: Diagnosis not present

## 2015-03-26 DIAGNOSIS — D631 Anemia in chronic kidney disease: Secondary | ICD-10-CM | POA: Diagnosis not present

## 2015-03-26 DIAGNOSIS — N2581 Secondary hyperparathyroidism of renal origin: Secondary | ICD-10-CM | POA: Diagnosis not present

## 2015-03-29 DIAGNOSIS — I82409 Acute embolism and thrombosis of unspecified deep veins of unspecified lower extremity: Secondary | ICD-10-CM | POA: Diagnosis not present

## 2015-03-29 DIAGNOSIS — N186 End stage renal disease: Secondary | ICD-10-CM | POA: Diagnosis not present

## 2015-03-29 DIAGNOSIS — N2581 Secondary hyperparathyroidism of renal origin: Secondary | ICD-10-CM | POA: Diagnosis not present

## 2015-03-29 DIAGNOSIS — D631 Anemia in chronic kidney disease: Secondary | ICD-10-CM | POA: Diagnosis not present

## 2015-04-02 DIAGNOSIS — N2581 Secondary hyperparathyroidism of renal origin: Secondary | ICD-10-CM | POA: Diagnosis not present

## 2015-04-02 DIAGNOSIS — N186 End stage renal disease: Secondary | ICD-10-CM | POA: Diagnosis not present

## 2015-04-02 DIAGNOSIS — D631 Anemia in chronic kidney disease: Secondary | ICD-10-CM | POA: Diagnosis not present

## 2015-04-03 DIAGNOSIS — N186 End stage renal disease: Secondary | ICD-10-CM | POA: Diagnosis not present

## 2015-04-03 DIAGNOSIS — Z992 Dependence on renal dialysis: Secondary | ICD-10-CM | POA: Diagnosis not present

## 2015-04-05 DIAGNOSIS — D631 Anemia in chronic kidney disease: Secondary | ICD-10-CM | POA: Diagnosis not present

## 2015-04-05 DIAGNOSIS — D509 Iron deficiency anemia, unspecified: Secondary | ICD-10-CM | POA: Diagnosis not present

## 2015-04-05 DIAGNOSIS — N186 End stage renal disease: Secondary | ICD-10-CM | POA: Diagnosis not present

## 2015-04-05 DIAGNOSIS — I82409 Acute embolism and thrombosis of unspecified deep veins of unspecified lower extremity: Secondary | ICD-10-CM | POA: Diagnosis not present

## 2015-04-05 DIAGNOSIS — N2581 Secondary hyperparathyroidism of renal origin: Secondary | ICD-10-CM | POA: Diagnosis not present

## 2015-04-09 DIAGNOSIS — N186 End stage renal disease: Secondary | ICD-10-CM | POA: Diagnosis not present

## 2015-04-09 DIAGNOSIS — D509 Iron deficiency anemia, unspecified: Secondary | ICD-10-CM | POA: Diagnosis not present

## 2015-04-09 DIAGNOSIS — D631 Anemia in chronic kidney disease: Secondary | ICD-10-CM | POA: Diagnosis not present

## 2015-04-09 DIAGNOSIS — N2581 Secondary hyperparathyroidism of renal origin: Secondary | ICD-10-CM | POA: Diagnosis not present

## 2015-04-12 DIAGNOSIS — I82409 Acute embolism and thrombosis of unspecified deep veins of unspecified lower extremity: Secondary | ICD-10-CM | POA: Diagnosis not present

## 2015-04-12 DIAGNOSIS — N2581 Secondary hyperparathyroidism of renal origin: Secondary | ICD-10-CM | POA: Diagnosis not present

## 2015-04-12 DIAGNOSIS — D509 Iron deficiency anemia, unspecified: Secondary | ICD-10-CM | POA: Diagnosis not present

## 2015-04-12 DIAGNOSIS — D631 Anemia in chronic kidney disease: Secondary | ICD-10-CM | POA: Diagnosis not present

## 2015-04-12 DIAGNOSIS — N186 End stage renal disease: Secondary | ICD-10-CM | POA: Diagnosis not present

## 2015-04-16 DIAGNOSIS — N186 End stage renal disease: Secondary | ICD-10-CM | POA: Diagnosis not present

## 2015-04-16 DIAGNOSIS — D509 Iron deficiency anemia, unspecified: Secondary | ICD-10-CM | POA: Diagnosis not present

## 2015-04-16 DIAGNOSIS — D631 Anemia in chronic kidney disease: Secondary | ICD-10-CM | POA: Diagnosis not present

## 2015-04-16 DIAGNOSIS — N2581 Secondary hyperparathyroidism of renal origin: Secondary | ICD-10-CM | POA: Diagnosis not present

## 2015-04-19 DIAGNOSIS — D631 Anemia in chronic kidney disease: Secondary | ICD-10-CM | POA: Diagnosis not present

## 2015-04-19 DIAGNOSIS — N2581 Secondary hyperparathyroidism of renal origin: Secondary | ICD-10-CM | POA: Diagnosis not present

## 2015-04-19 DIAGNOSIS — D509 Iron deficiency anemia, unspecified: Secondary | ICD-10-CM | POA: Diagnosis not present

## 2015-04-19 DIAGNOSIS — N186 End stage renal disease: Secondary | ICD-10-CM | POA: Diagnosis not present

## 2015-04-19 DIAGNOSIS — I82409 Acute embolism and thrombosis of unspecified deep veins of unspecified lower extremity: Secondary | ICD-10-CM | POA: Diagnosis not present

## 2015-04-22 ENCOUNTER — Ambulatory Visit (INDEPENDENT_AMBULATORY_CARE_PROVIDER_SITE_OTHER): Payer: Medicare Other | Admitting: Family Medicine

## 2015-04-22 VITALS — BP 164/78 | HR 75 | Ht 62.0 in | Wt 92.0 lb

## 2015-04-22 DIAGNOSIS — D510 Vitamin B12 deficiency anemia due to intrinsic factor deficiency: Secondary | ICD-10-CM | POA: Diagnosis not present

## 2015-04-22 MED ORDER — CYANOCOBALAMIN 1000 MCG/ML IJ SOLN
1000.0000 ug | Freq: Once | INTRAMUSCULAR | Status: AC
  Administered 2015-04-22: 1000 ug via INTRAMUSCULAR

## 2015-04-22 NOTE — Progress Notes (Signed)
   Subjective:    Patient ID: Gabrielle Stafford, female    DOB: Sep 09, 1929, 82 y.o.   MRN: [id number]  HPI  Patient is here for a vit B12 injection. Denies any gastrointestinal problems or dizziness.  Review of Systems     Objective:   Physical Exam  BP 164/78 mmHg  Pulse 75  Ht 5\' 2"  (1.575 m)  Wt 92 lb (41.731 kg)  BMI 16.82 kg/m2       Assessment & Plan:   Patient had injection in left upper arm, tolerated injection well without complications. Made a follow up visit to return in 30 days.

## 2015-04-22 NOTE — Progress Notes (Signed)
Agree with below. \Jaydee Ingman, MD  

## 2015-04-23 DIAGNOSIS — D509 Iron deficiency anemia, unspecified: Secondary | ICD-10-CM | POA: Diagnosis not present

## 2015-04-23 DIAGNOSIS — N2581 Secondary hyperparathyroidism of renal origin: Secondary | ICD-10-CM | POA: Diagnosis not present

## 2015-04-23 DIAGNOSIS — N186 End stage renal disease: Secondary | ICD-10-CM | POA: Diagnosis not present

## 2015-04-23 DIAGNOSIS — D631 Anemia in chronic kidney disease: Secondary | ICD-10-CM | POA: Diagnosis not present

## 2015-04-26 DIAGNOSIS — N2581 Secondary hyperparathyroidism of renal origin: Secondary | ICD-10-CM | POA: Diagnosis not present

## 2015-04-26 DIAGNOSIS — N186 End stage renal disease: Secondary | ICD-10-CM | POA: Diagnosis not present

## 2015-04-26 DIAGNOSIS — I82409 Acute embolism and thrombosis of unspecified deep veins of unspecified lower extremity: Secondary | ICD-10-CM | POA: Diagnosis not present

## 2015-04-26 DIAGNOSIS — D631 Anemia in chronic kidney disease: Secondary | ICD-10-CM | POA: Diagnosis not present

## 2015-04-26 DIAGNOSIS — D509 Iron deficiency anemia, unspecified: Secondary | ICD-10-CM | POA: Diagnosis not present

## 2015-04-30 DIAGNOSIS — N2581 Secondary hyperparathyroidism of renal origin: Secondary | ICD-10-CM | POA: Diagnosis not present

## 2015-04-30 DIAGNOSIS — D631 Anemia in chronic kidney disease: Secondary | ICD-10-CM | POA: Diagnosis not present

## 2015-04-30 DIAGNOSIS — D509 Iron deficiency anemia, unspecified: Secondary | ICD-10-CM | POA: Diagnosis not present

## 2015-04-30 DIAGNOSIS — N186 End stage renal disease: Secondary | ICD-10-CM | POA: Diagnosis not present

## 2015-05-03 DIAGNOSIS — D509 Iron deficiency anemia, unspecified: Secondary | ICD-10-CM | POA: Diagnosis not present

## 2015-05-03 DIAGNOSIS — N2581 Secondary hyperparathyroidism of renal origin: Secondary | ICD-10-CM | POA: Diagnosis not present

## 2015-05-03 DIAGNOSIS — N186 End stage renal disease: Secondary | ICD-10-CM | POA: Diagnosis not present

## 2015-05-03 DIAGNOSIS — I82409 Acute embolism and thrombosis of unspecified deep veins of unspecified lower extremity: Secondary | ICD-10-CM | POA: Diagnosis not present

## 2015-05-03 DIAGNOSIS — D631 Anemia in chronic kidney disease: Secondary | ICD-10-CM | POA: Diagnosis not present

## 2015-05-04 DIAGNOSIS — N186 End stage renal disease: Secondary | ICD-10-CM | POA: Diagnosis not present

## 2015-05-04 DIAGNOSIS — Z992 Dependence on renal dialysis: Secondary | ICD-10-CM | POA: Diagnosis not present

## 2015-05-07 DIAGNOSIS — Z23 Encounter for immunization: Secondary | ICD-10-CM | POA: Diagnosis not present

## 2015-05-07 DIAGNOSIS — D509 Iron deficiency anemia, unspecified: Secondary | ICD-10-CM | POA: Diagnosis not present

## 2015-05-07 DIAGNOSIS — N186 End stage renal disease: Secondary | ICD-10-CM | POA: Diagnosis not present

## 2015-05-07 DIAGNOSIS — N2581 Secondary hyperparathyroidism of renal origin: Secondary | ICD-10-CM | POA: Diagnosis not present

## 2015-05-07 DIAGNOSIS — D631 Anemia in chronic kidney disease: Secondary | ICD-10-CM | POA: Diagnosis not present

## 2015-05-10 DIAGNOSIS — D509 Iron deficiency anemia, unspecified: Secondary | ICD-10-CM | POA: Diagnosis not present

## 2015-05-10 DIAGNOSIS — N186 End stage renal disease: Secondary | ICD-10-CM | POA: Diagnosis not present

## 2015-05-10 DIAGNOSIS — D631 Anemia in chronic kidney disease: Secondary | ICD-10-CM | POA: Diagnosis not present

## 2015-05-10 DIAGNOSIS — N2581 Secondary hyperparathyroidism of renal origin: Secondary | ICD-10-CM | POA: Diagnosis not present

## 2015-05-10 DIAGNOSIS — I82409 Acute embolism and thrombosis of unspecified deep veins of unspecified lower extremity: Secondary | ICD-10-CM | POA: Diagnosis not present

## 2015-05-10 DIAGNOSIS — Z23 Encounter for immunization: Secondary | ICD-10-CM | POA: Diagnosis not present

## 2015-05-14 DIAGNOSIS — N2581 Secondary hyperparathyroidism of renal origin: Secondary | ICD-10-CM | POA: Diagnosis not present

## 2015-05-14 DIAGNOSIS — D509 Iron deficiency anemia, unspecified: Secondary | ICD-10-CM | POA: Diagnosis not present

## 2015-05-14 DIAGNOSIS — N186 End stage renal disease: Secondary | ICD-10-CM | POA: Diagnosis not present

## 2015-05-14 DIAGNOSIS — Z23 Encounter for immunization: Secondary | ICD-10-CM | POA: Diagnosis not present

## 2015-05-14 DIAGNOSIS — D631 Anemia in chronic kidney disease: Secondary | ICD-10-CM | POA: Diagnosis not present

## 2015-05-17 DIAGNOSIS — N186 End stage renal disease: Secondary | ICD-10-CM | POA: Diagnosis not present

## 2015-05-17 DIAGNOSIS — D509 Iron deficiency anemia, unspecified: Secondary | ICD-10-CM | POA: Diagnosis not present

## 2015-05-17 DIAGNOSIS — I82409 Acute embolism and thrombosis of unspecified deep veins of unspecified lower extremity: Secondary | ICD-10-CM | POA: Diagnosis not present

## 2015-05-17 DIAGNOSIS — N2581 Secondary hyperparathyroidism of renal origin: Secondary | ICD-10-CM | POA: Diagnosis not present

## 2015-05-17 DIAGNOSIS — D631 Anemia in chronic kidney disease: Secondary | ICD-10-CM | POA: Diagnosis not present

## 2015-05-21 DIAGNOSIS — N2581 Secondary hyperparathyroidism of renal origin: Secondary | ICD-10-CM | POA: Diagnosis not present

## 2015-05-21 DIAGNOSIS — D631 Anemia in chronic kidney disease: Secondary | ICD-10-CM | POA: Diagnosis not present

## 2015-05-21 DIAGNOSIS — D509 Iron deficiency anemia, unspecified: Secondary | ICD-10-CM | POA: Diagnosis not present

## 2015-05-21 DIAGNOSIS — N186 End stage renal disease: Secondary | ICD-10-CM | POA: Diagnosis not present

## 2015-05-24 ENCOUNTER — Ambulatory Visit: Payer: Medicare Other

## 2015-05-24 DIAGNOSIS — N186 End stage renal disease: Secondary | ICD-10-CM | POA: Diagnosis not present

## 2015-05-24 DIAGNOSIS — D509 Iron deficiency anemia, unspecified: Secondary | ICD-10-CM | POA: Diagnosis not present

## 2015-05-24 DIAGNOSIS — N2581 Secondary hyperparathyroidism of renal origin: Secondary | ICD-10-CM | POA: Diagnosis not present

## 2015-05-24 DIAGNOSIS — D631 Anemia in chronic kidney disease: Secondary | ICD-10-CM | POA: Diagnosis not present

## 2015-05-24 DIAGNOSIS — I82409 Acute embolism and thrombosis of unspecified deep veins of unspecified lower extremity: Secondary | ICD-10-CM | POA: Diagnosis not present

## 2015-05-26 ENCOUNTER — Ambulatory Visit (INDEPENDENT_AMBULATORY_CARE_PROVIDER_SITE_OTHER): Payer: Medicare Other | Admitting: Physician Assistant

## 2015-05-26 VITALS — BP 150/49 | HR 78

## 2015-05-26 DIAGNOSIS — D510 Vitamin B12 deficiency anemia due to intrinsic factor deficiency: Secondary | ICD-10-CM | POA: Diagnosis not present

## 2015-05-26 DIAGNOSIS — R030 Elevated blood-pressure reading, without diagnosis of hypertension: Secondary | ICD-10-CM | POA: Diagnosis not present

## 2015-05-26 DIAGNOSIS — IMO0001 Reserved for inherently not codable concepts without codable children: Secondary | ICD-10-CM

## 2015-05-26 MED ORDER — CYANOCOBALAMIN 1000 MCG/ML IJ SOLN
1000.0000 ug | Freq: Once | INTRAMUSCULAR | Status: AC
  Administered 2015-05-26: 1000 ug via INTRAMUSCULAR

## 2015-05-26 NOTE — Progress Notes (Signed)
Pt came into clinic today for B12 injection. Pt BP was elevated in office, let her sit then rechecked. Pt tolerated B12 injection well in right deltoid, with no immediate complications. Spoke with Physician in office and informed Pt when she goes to Dialysis on Friday if her BP is still elevated to call and schedule a follow up with PCP. Verbalized understanding.

## 2015-05-28 DIAGNOSIS — D631 Anemia in chronic kidney disease: Secondary | ICD-10-CM | POA: Diagnosis not present

## 2015-05-28 DIAGNOSIS — N186 End stage renal disease: Secondary | ICD-10-CM | POA: Diagnosis not present

## 2015-05-28 DIAGNOSIS — D509 Iron deficiency anemia, unspecified: Secondary | ICD-10-CM | POA: Diagnosis not present

## 2015-05-28 DIAGNOSIS — N2581 Secondary hyperparathyroidism of renal origin: Secondary | ICD-10-CM | POA: Diagnosis not present

## 2015-05-31 DIAGNOSIS — N2581 Secondary hyperparathyroidism of renal origin: Secondary | ICD-10-CM | POA: Diagnosis not present

## 2015-05-31 DIAGNOSIS — N186 End stage renal disease: Secondary | ICD-10-CM | POA: Diagnosis not present

## 2015-05-31 DIAGNOSIS — I82409 Acute embolism and thrombosis of unspecified deep veins of unspecified lower extremity: Secondary | ICD-10-CM | POA: Diagnosis not present

## 2015-05-31 DIAGNOSIS — D631 Anemia in chronic kidney disease: Secondary | ICD-10-CM | POA: Diagnosis not present

## 2015-05-31 DIAGNOSIS — D509 Iron deficiency anemia, unspecified: Secondary | ICD-10-CM | POA: Diagnosis not present

## 2015-06-01 DIAGNOSIS — N186 End stage renal disease: Secondary | ICD-10-CM | POA: Diagnosis not present

## 2015-06-01 DIAGNOSIS — Z992 Dependence on renal dialysis: Secondary | ICD-10-CM | POA: Diagnosis not present

## 2015-06-04 DIAGNOSIS — D631 Anemia in chronic kidney disease: Secondary | ICD-10-CM | POA: Diagnosis not present

## 2015-06-04 DIAGNOSIS — N186 End stage renal disease: Secondary | ICD-10-CM | POA: Diagnosis not present

## 2015-06-04 DIAGNOSIS — D509 Iron deficiency anemia, unspecified: Secondary | ICD-10-CM | POA: Diagnosis not present

## 2015-06-04 DIAGNOSIS — N2581 Secondary hyperparathyroidism of renal origin: Secondary | ICD-10-CM | POA: Diagnosis not present

## 2015-06-04 LAB — ALBUMIN
Albumin: 3.8
Calcium: 7.8 mg/dL
PHOSPHORUS: 9.9
PTH: 565 pg/mL
Potassium: 4.7 mmol/L

## 2015-06-04 LAB — BASIC METABOLIC PANEL: POTASSIUM: 4.7 mmol/L (ref 3.4–5.3)

## 2015-06-07 DIAGNOSIS — N186 End stage renal disease: Secondary | ICD-10-CM | POA: Diagnosis not present

## 2015-06-07 DIAGNOSIS — I82409 Acute embolism and thrombosis of unspecified deep veins of unspecified lower extremity: Secondary | ICD-10-CM | POA: Diagnosis not present

## 2015-06-07 DIAGNOSIS — N2581 Secondary hyperparathyroidism of renal origin: Secondary | ICD-10-CM | POA: Diagnosis not present

## 2015-06-07 DIAGNOSIS — D509 Iron deficiency anemia, unspecified: Secondary | ICD-10-CM | POA: Diagnosis not present

## 2015-06-07 DIAGNOSIS — D631 Anemia in chronic kidney disease: Secondary | ICD-10-CM | POA: Diagnosis not present

## 2015-06-09 DIAGNOSIS — H93293 Other abnormal auditory perceptions, bilateral: Secondary | ICD-10-CM | POA: Diagnosis not present

## 2015-06-09 DIAGNOSIS — H6983 Other specified disorders of Eustachian tube, bilateral: Secondary | ICD-10-CM | POA: Diagnosis not present

## 2015-06-11 DIAGNOSIS — N2581 Secondary hyperparathyroidism of renal origin: Secondary | ICD-10-CM | POA: Diagnosis not present

## 2015-06-11 DIAGNOSIS — N186 End stage renal disease: Secondary | ICD-10-CM | POA: Diagnosis not present

## 2015-06-11 DIAGNOSIS — D631 Anemia in chronic kidney disease: Secondary | ICD-10-CM | POA: Diagnosis not present

## 2015-06-11 DIAGNOSIS — D509 Iron deficiency anemia, unspecified: Secondary | ICD-10-CM | POA: Diagnosis not present

## 2015-06-14 DIAGNOSIS — I82409 Acute embolism and thrombosis of unspecified deep veins of unspecified lower extremity: Secondary | ICD-10-CM | POA: Diagnosis not present

## 2015-06-14 DIAGNOSIS — D631 Anemia in chronic kidney disease: Secondary | ICD-10-CM | POA: Diagnosis not present

## 2015-06-14 DIAGNOSIS — N186 End stage renal disease: Secondary | ICD-10-CM | POA: Diagnosis not present

## 2015-06-14 DIAGNOSIS — N2581 Secondary hyperparathyroidism of renal origin: Secondary | ICD-10-CM | POA: Diagnosis not present

## 2015-06-18 DIAGNOSIS — N186 End stage renal disease: Secondary | ICD-10-CM | POA: Diagnosis not present

## 2015-06-18 DIAGNOSIS — N2581 Secondary hyperparathyroidism of renal origin: Secondary | ICD-10-CM | POA: Diagnosis not present

## 2015-06-18 DIAGNOSIS — D631 Anemia in chronic kidney disease: Secondary | ICD-10-CM | POA: Diagnosis not present

## 2015-06-21 DIAGNOSIS — N2581 Secondary hyperparathyroidism of renal origin: Secondary | ICD-10-CM | POA: Diagnosis not present

## 2015-06-21 DIAGNOSIS — I82409 Acute embolism and thrombosis of unspecified deep veins of unspecified lower extremity: Secondary | ICD-10-CM | POA: Diagnosis not present

## 2015-06-21 DIAGNOSIS — D631 Anemia in chronic kidney disease: Secondary | ICD-10-CM | POA: Diagnosis not present

## 2015-06-21 DIAGNOSIS — N186 End stage renal disease: Secondary | ICD-10-CM | POA: Diagnosis not present

## 2015-06-23 ENCOUNTER — Ambulatory Visit (INDEPENDENT_AMBULATORY_CARE_PROVIDER_SITE_OTHER): Payer: Medicare Other | Admitting: Family Medicine

## 2015-06-23 VITALS — BP 131/74 | HR 82

## 2015-06-23 DIAGNOSIS — D510 Vitamin B12 deficiency anemia due to intrinsic factor deficiency: Secondary | ICD-10-CM | POA: Diagnosis not present

## 2015-06-23 MED ORDER — CYANOCOBALAMIN 1000 MCG/ML IJ SOLN
1000.0000 ug | Freq: Once | INTRAMUSCULAR | Status: AC
  Administered 2015-06-23: 1000 ug via INTRAMUSCULAR

## 2015-06-23 NOTE — Progress Notes (Signed)
Gabrielle Stafford is here for a vitamin B 12 injection. Denies muscle cramps, weakness or irregular heart rate. Patient tolerated injection well without complications. Patient advised to schedule next injection 30 days from today.

## 2015-06-24 NOTE — Progress Notes (Signed)
   Subjective:    Patient ID: Gabrielle Stafford, female    DOB: 09/15/1929, 82 y.o.   MRN: [id number]  HPI    Review of Systems     Objective:   Physical Exam        Assessment & Plan:  Agree with below.  Khi Mcmillen, MD  

## 2015-06-25 DIAGNOSIS — T80219A Unspecified infection due to central venous catheter, initial encounter: Secondary | ICD-10-CM | POA: Diagnosis not present

## 2015-06-25 DIAGNOSIS — N2581 Secondary hyperparathyroidism of renal origin: Secondary | ICD-10-CM | POA: Diagnosis not present

## 2015-06-25 DIAGNOSIS — N186 End stage renal disease: Secondary | ICD-10-CM | POA: Diagnosis not present

## 2015-06-25 DIAGNOSIS — D631 Anemia in chronic kidney disease: Secondary | ICD-10-CM | POA: Diagnosis not present

## 2015-06-28 DIAGNOSIS — T80219A Unspecified infection due to central venous catheter, initial encounter: Secondary | ICD-10-CM | POA: Diagnosis not present

## 2015-06-28 DIAGNOSIS — N186 End stage renal disease: Secondary | ICD-10-CM | POA: Diagnosis not present

## 2015-06-28 DIAGNOSIS — I82409 Acute embolism and thrombosis of unspecified deep veins of unspecified lower extremity: Secondary | ICD-10-CM | POA: Diagnosis not present

## 2015-06-28 DIAGNOSIS — N2581 Secondary hyperparathyroidism of renal origin: Secondary | ICD-10-CM | POA: Diagnosis not present

## 2015-06-28 DIAGNOSIS — L089 Local infection of the skin and subcutaneous tissue, unspecified: Secondary | ICD-10-CM | POA: Diagnosis not present

## 2015-06-28 DIAGNOSIS — D631 Anemia in chronic kidney disease: Secondary | ICD-10-CM | POA: Diagnosis not present

## 2015-07-02 DIAGNOSIS — T80219A Unspecified infection due to central venous catheter, initial encounter: Secondary | ICD-10-CM | POA: Diagnosis not present

## 2015-07-02 DIAGNOSIS — N186 End stage renal disease: Secondary | ICD-10-CM | POA: Diagnosis not present

## 2015-07-02 DIAGNOSIS — D631 Anemia in chronic kidney disease: Secondary | ICD-10-CM | POA: Diagnosis not present

## 2015-07-02 DIAGNOSIS — Z992 Dependence on renal dialysis: Secondary | ICD-10-CM | POA: Diagnosis not present

## 2015-07-02 DIAGNOSIS — N2581 Secondary hyperparathyroidism of renal origin: Secondary | ICD-10-CM | POA: Diagnosis not present

## 2015-07-05 DIAGNOSIS — D631 Anemia in chronic kidney disease: Secondary | ICD-10-CM | POA: Diagnosis not present

## 2015-07-05 DIAGNOSIS — T80219A Unspecified infection due to central venous catheter, initial encounter: Secondary | ICD-10-CM | POA: Diagnosis not present

## 2015-07-05 DIAGNOSIS — T80219D Unspecified infection due to central venous catheter, subsequent encounter: Secondary | ICD-10-CM | POA: Diagnosis not present

## 2015-07-05 DIAGNOSIS — N2581 Secondary hyperparathyroidism of renal origin: Secondary | ICD-10-CM | POA: Diagnosis not present

## 2015-07-05 DIAGNOSIS — I82409 Acute embolism and thrombosis of unspecified deep veins of unspecified lower extremity: Secondary | ICD-10-CM | POA: Diagnosis not present

## 2015-07-05 DIAGNOSIS — N186 End stage renal disease: Secondary | ICD-10-CM | POA: Diagnosis not present

## 2015-07-09 ENCOUNTER — Encounter: Payer: Self-pay | Admitting: Family Medicine

## 2015-07-09 DIAGNOSIS — N2581 Secondary hyperparathyroidism of renal origin: Secondary | ICD-10-CM | POA: Diagnosis not present

## 2015-07-09 DIAGNOSIS — N186 End stage renal disease: Secondary | ICD-10-CM | POA: Diagnosis not present

## 2015-07-09 DIAGNOSIS — D631 Anemia in chronic kidney disease: Secondary | ICD-10-CM | POA: Diagnosis not present

## 2015-07-09 DIAGNOSIS — T80219A Unspecified infection due to central venous catheter, initial encounter: Secondary | ICD-10-CM | POA: Diagnosis not present

## 2015-07-12 DIAGNOSIS — N186 End stage renal disease: Secondary | ICD-10-CM | POA: Diagnosis not present

## 2015-07-12 DIAGNOSIS — I82409 Acute embolism and thrombosis of unspecified deep veins of unspecified lower extremity: Secondary | ICD-10-CM | POA: Diagnosis not present

## 2015-07-12 DIAGNOSIS — D631 Anemia in chronic kidney disease: Secondary | ICD-10-CM | POA: Diagnosis not present

## 2015-07-12 DIAGNOSIS — N2581 Secondary hyperparathyroidism of renal origin: Secondary | ICD-10-CM | POA: Diagnosis not present

## 2015-07-12 DIAGNOSIS — T80219A Unspecified infection due to central venous catheter, initial encounter: Secondary | ICD-10-CM | POA: Diagnosis not present

## 2015-07-16 DIAGNOSIS — D509 Iron deficiency anemia, unspecified: Secondary | ICD-10-CM | POA: Diagnosis not present

## 2015-07-16 DIAGNOSIS — D631 Anemia in chronic kidney disease: Secondary | ICD-10-CM | POA: Diagnosis not present

## 2015-07-16 DIAGNOSIS — N2581 Secondary hyperparathyroidism of renal origin: Secondary | ICD-10-CM | POA: Diagnosis not present

## 2015-07-16 DIAGNOSIS — N186 End stage renal disease: Secondary | ICD-10-CM | POA: Diagnosis not present

## 2015-07-19 DIAGNOSIS — D509 Iron deficiency anemia, unspecified: Secondary | ICD-10-CM | POA: Diagnosis not present

## 2015-07-19 DIAGNOSIS — D631 Anemia in chronic kidney disease: Secondary | ICD-10-CM | POA: Diagnosis not present

## 2015-07-19 DIAGNOSIS — N2581 Secondary hyperparathyroidism of renal origin: Secondary | ICD-10-CM | POA: Diagnosis not present

## 2015-07-19 DIAGNOSIS — N186 End stage renal disease: Secondary | ICD-10-CM | POA: Diagnosis not present

## 2015-07-21 ENCOUNTER — Ambulatory Visit (INDEPENDENT_AMBULATORY_CARE_PROVIDER_SITE_OTHER): Payer: Medicare Other | Admitting: Family Medicine

## 2015-07-21 VITALS — BP 140/77 | HR 75

## 2015-07-21 DIAGNOSIS — D510 Vitamin B12 deficiency anemia due to intrinsic factor deficiency: Secondary | ICD-10-CM

## 2015-07-21 MED ORDER — CYANOCOBALAMIN 1000 MCG/ML IJ SOLN
1000.0000 ug | Freq: Once | INTRAMUSCULAR | Status: AC
  Administered 2015-07-21: 1000 ug via INTRAMUSCULAR

## 2015-07-21 NOTE — Progress Notes (Signed)
Agree with below. \Catherine Metheney, MD  

## 2015-07-21 NOTE — Progress Notes (Signed)
Gabrielle Stafford is here for a vitamin B 12 injection. Denies muscle cramps, weakness or irregular heart rate.    Patient tolerated injection well without complications. Patient advised to schedule next injection 30 days from today.

## 2015-07-23 DIAGNOSIS — N186 End stage renal disease: Secondary | ICD-10-CM | POA: Diagnosis not present

## 2015-07-23 DIAGNOSIS — D631 Anemia in chronic kidney disease: Secondary | ICD-10-CM | POA: Diagnosis not present

## 2015-07-23 DIAGNOSIS — N2581 Secondary hyperparathyroidism of renal origin: Secondary | ICD-10-CM | POA: Diagnosis not present

## 2015-07-26 DIAGNOSIS — D631 Anemia in chronic kidney disease: Secondary | ICD-10-CM | POA: Diagnosis not present

## 2015-07-26 DIAGNOSIS — N186 End stage renal disease: Secondary | ICD-10-CM | POA: Diagnosis not present

## 2015-07-26 DIAGNOSIS — N2581 Secondary hyperparathyroidism of renal origin: Secondary | ICD-10-CM | POA: Diagnosis not present

## 2015-07-26 DIAGNOSIS — I82409 Acute embolism and thrombosis of unspecified deep veins of unspecified lower extremity: Secondary | ICD-10-CM | POA: Diagnosis not present

## 2015-07-30 DIAGNOSIS — N186 End stage renal disease: Secondary | ICD-10-CM | POA: Diagnosis not present

## 2015-07-30 DIAGNOSIS — N2581 Secondary hyperparathyroidism of renal origin: Secondary | ICD-10-CM | POA: Diagnosis not present

## 2015-07-30 DIAGNOSIS — D631 Anemia in chronic kidney disease: Secondary | ICD-10-CM | POA: Diagnosis not present

## 2015-08-01 DIAGNOSIS — N186 End stage renal disease: Secondary | ICD-10-CM | POA: Diagnosis not present

## 2015-08-01 DIAGNOSIS — Z992 Dependence on renal dialysis: Secondary | ICD-10-CM | POA: Diagnosis not present

## 2015-08-02 DIAGNOSIS — D509 Iron deficiency anemia, unspecified: Secondary | ICD-10-CM | POA: Diagnosis not present

## 2015-08-02 DIAGNOSIS — N186 End stage renal disease: Secondary | ICD-10-CM | POA: Diagnosis not present

## 2015-08-02 DIAGNOSIS — D631 Anemia in chronic kidney disease: Secondary | ICD-10-CM | POA: Diagnosis not present

## 2015-08-02 DIAGNOSIS — N2581 Secondary hyperparathyroidism of renal origin: Secondary | ICD-10-CM | POA: Diagnosis not present

## 2015-08-02 DIAGNOSIS — I82409 Acute embolism and thrombosis of unspecified deep veins of unspecified lower extremity: Secondary | ICD-10-CM | POA: Diagnosis not present

## 2015-08-06 DIAGNOSIS — N186 End stage renal disease: Secondary | ICD-10-CM | POA: Diagnosis not present

## 2015-08-06 DIAGNOSIS — N2581 Secondary hyperparathyroidism of renal origin: Secondary | ICD-10-CM | POA: Diagnosis not present

## 2015-08-06 DIAGNOSIS — D509 Iron deficiency anemia, unspecified: Secondary | ICD-10-CM | POA: Diagnosis not present

## 2015-08-06 DIAGNOSIS — D631 Anemia in chronic kidney disease: Secondary | ICD-10-CM | POA: Diagnosis not present

## 2015-08-09 DIAGNOSIS — N2581 Secondary hyperparathyroidism of renal origin: Secondary | ICD-10-CM | POA: Diagnosis not present

## 2015-08-09 DIAGNOSIS — D631 Anemia in chronic kidney disease: Secondary | ICD-10-CM | POA: Diagnosis not present

## 2015-08-09 DIAGNOSIS — N186 End stage renal disease: Secondary | ICD-10-CM | POA: Diagnosis not present

## 2015-08-09 DIAGNOSIS — I82409 Acute embolism and thrombosis of unspecified deep veins of unspecified lower extremity: Secondary | ICD-10-CM | POA: Diagnosis not present

## 2015-08-09 DIAGNOSIS — D509 Iron deficiency anemia, unspecified: Secondary | ICD-10-CM | POA: Diagnosis not present

## 2015-08-11 ENCOUNTER — Telehealth: Payer: Self-pay | Admitting: *Deleted

## 2015-08-11 DIAGNOSIS — L989 Disorder of the skin and subcutaneous tissue, unspecified: Secondary | ICD-10-CM

## 2015-08-11 DIAGNOSIS — Z7689 Persons encountering health services in other specified circumstances: Secondary | ICD-10-CM

## 2015-08-13 DIAGNOSIS — D631 Anemia in chronic kidney disease: Secondary | ICD-10-CM | POA: Diagnosis not present

## 2015-08-13 DIAGNOSIS — N2581 Secondary hyperparathyroidism of renal origin: Secondary | ICD-10-CM | POA: Diagnosis not present

## 2015-08-13 DIAGNOSIS — N186 End stage renal disease: Secondary | ICD-10-CM | POA: Diagnosis not present

## 2015-08-16 DIAGNOSIS — N2581 Secondary hyperparathyroidism of renal origin: Secondary | ICD-10-CM | POA: Diagnosis not present

## 2015-08-16 DIAGNOSIS — N186 End stage renal disease: Secondary | ICD-10-CM | POA: Diagnosis not present

## 2015-08-16 DIAGNOSIS — D631 Anemia in chronic kidney disease: Secondary | ICD-10-CM | POA: Diagnosis not present

## 2015-08-16 DIAGNOSIS — I82409 Acute embolism and thrombosis of unspecified deep veins of unspecified lower extremity: Secondary | ICD-10-CM | POA: Diagnosis not present

## 2015-08-16 NOTE — Telephone Encounter (Signed)
Pt would like a referral for a Geriatrician, and wound care for places on her legs.Gabrielle Stafford

## 2015-08-18 ENCOUNTER — Ambulatory Visit (INDEPENDENT_AMBULATORY_CARE_PROVIDER_SITE_OTHER): Payer: Medicare Other | Admitting: Family Medicine

## 2015-08-18 VITALS — BP 150/75 | HR 74

## 2015-08-18 DIAGNOSIS — D510 Vitamin B12 deficiency anemia due to intrinsic factor deficiency: Secondary | ICD-10-CM

## 2015-08-18 MED ORDER — CYANOCOBALAMIN 1000 MCG/ML IJ SOLN
1000.0000 ug | Freq: Once | INTRAMUSCULAR | Status: AC
  Administered 2015-08-18: 1000 ug via INTRAMUSCULAR

## 2015-08-18 NOTE — Progress Notes (Signed)
Gabrielle Stafford is here for a vitamin B 12 injection. Denies muscle cramps, weakness or irregular heart rate.   Patient tolerated injection well without complications. Patient advised to schedule next injection 30 days from today.    Agree with above.  Beatrice Lecher, MD

## 2015-08-20 DIAGNOSIS — N186 End stage renal disease: Secondary | ICD-10-CM | POA: Diagnosis not present

## 2015-08-20 DIAGNOSIS — D631 Anemia in chronic kidney disease: Secondary | ICD-10-CM | POA: Diagnosis not present

## 2015-08-20 DIAGNOSIS — N2581 Secondary hyperparathyroidism of renal origin: Secondary | ICD-10-CM | POA: Diagnosis not present

## 2015-08-23 DIAGNOSIS — N2581 Secondary hyperparathyroidism of renal origin: Secondary | ICD-10-CM | POA: Diagnosis not present

## 2015-08-23 DIAGNOSIS — N186 End stage renal disease: Secondary | ICD-10-CM | POA: Diagnosis not present

## 2015-08-23 DIAGNOSIS — I82409 Acute embolism and thrombosis of unspecified deep veins of unspecified lower extremity: Secondary | ICD-10-CM | POA: Diagnosis not present

## 2015-08-23 DIAGNOSIS — D631 Anemia in chronic kidney disease: Secondary | ICD-10-CM | POA: Diagnosis not present

## 2015-08-27 DIAGNOSIS — D631 Anemia in chronic kidney disease: Secondary | ICD-10-CM | POA: Diagnosis not present

## 2015-08-27 DIAGNOSIS — N2581 Secondary hyperparathyroidism of renal origin: Secondary | ICD-10-CM | POA: Diagnosis not present

## 2015-08-27 DIAGNOSIS — N186 End stage renal disease: Secondary | ICD-10-CM | POA: Diagnosis not present

## 2015-08-30 DIAGNOSIS — N186 End stage renal disease: Secondary | ICD-10-CM | POA: Diagnosis not present

## 2015-08-30 DIAGNOSIS — N2581 Secondary hyperparathyroidism of renal origin: Secondary | ICD-10-CM | POA: Diagnosis not present

## 2015-08-30 DIAGNOSIS — D631 Anemia in chronic kidney disease: Secondary | ICD-10-CM | POA: Diagnosis not present

## 2015-08-30 DIAGNOSIS — I82409 Acute embolism and thrombosis of unspecified deep veins of unspecified lower extremity: Secondary | ICD-10-CM | POA: Diagnosis not present

## 2015-09-01 DIAGNOSIS — Z992 Dependence on renal dialysis: Secondary | ICD-10-CM | POA: Diagnosis not present

## 2015-09-01 DIAGNOSIS — N186 End stage renal disease: Secondary | ICD-10-CM | POA: Diagnosis not present

## 2015-09-03 DIAGNOSIS — N186 End stage renal disease: Secondary | ICD-10-CM | POA: Diagnosis not present

## 2015-09-03 DIAGNOSIS — D509 Iron deficiency anemia, unspecified: Secondary | ICD-10-CM | POA: Diagnosis not present

## 2015-09-03 DIAGNOSIS — N2581 Secondary hyperparathyroidism of renal origin: Secondary | ICD-10-CM | POA: Diagnosis not present

## 2015-09-03 DIAGNOSIS — D631 Anemia in chronic kidney disease: Secondary | ICD-10-CM | POA: Diagnosis not present

## 2015-09-04 DIAGNOSIS — Z992 Dependence on renal dialysis: Secondary | ICD-10-CM | POA: Diagnosis not present

## 2015-09-04 DIAGNOSIS — E785 Hyperlipidemia, unspecified: Secondary | ICD-10-CM | POA: Diagnosis not present

## 2015-09-04 DIAGNOSIS — Z87891 Personal history of nicotine dependence: Secondary | ICD-10-CM | POA: Diagnosis not present

## 2015-09-04 DIAGNOSIS — Z86718 Personal history of other venous thrombosis and embolism: Secondary | ICD-10-CM | POA: Diagnosis not present

## 2015-09-04 DIAGNOSIS — N186 End stage renal disease: Secondary | ICD-10-CM | POA: Diagnosis not present

## 2015-09-04 DIAGNOSIS — Z91041 Radiographic dye allergy status: Secondary | ICD-10-CM | POA: Diagnosis not present

## 2015-09-04 DIAGNOSIS — Z882 Allergy status to sulfonamides status: Secondary | ICD-10-CM | POA: Diagnosis not present

## 2015-09-04 DIAGNOSIS — Z7901 Long term (current) use of anticoagulants: Secondary | ICD-10-CM | POA: Diagnosis not present

## 2015-09-04 DIAGNOSIS — I4891 Unspecified atrial fibrillation: Secondary | ICD-10-CM | POA: Diagnosis not present

## 2015-09-04 DIAGNOSIS — Z881 Allergy status to other antibiotic agents status: Secondary | ICD-10-CM | POA: Diagnosis not present

## 2015-09-04 DIAGNOSIS — R071 Chest pain on breathing: Secondary | ICD-10-CM | POA: Diagnosis not present

## 2015-09-04 DIAGNOSIS — Z85038 Personal history of other malignant neoplasm of large intestine: Secondary | ICD-10-CM | POA: Diagnosis not present

## 2015-09-04 DIAGNOSIS — R0789 Other chest pain: Secondary | ICD-10-CM | POA: Diagnosis not present

## 2015-09-04 DIAGNOSIS — Z888 Allergy status to other drugs, medicaments and biological substances status: Secondary | ICD-10-CM | POA: Diagnosis not present

## 2015-09-04 DIAGNOSIS — Z79899 Other long term (current) drug therapy: Secondary | ICD-10-CM | POA: Diagnosis not present

## 2015-09-04 DIAGNOSIS — I120 Hypertensive chronic kidney disease with stage 5 chronic kidney disease or end stage renal disease: Secondary | ICD-10-CM | POA: Diagnosis not present

## 2015-09-04 DIAGNOSIS — K219 Gastro-esophageal reflux disease without esophagitis: Secondary | ICD-10-CM | POA: Diagnosis not present

## 2015-09-04 DIAGNOSIS — I672 Cerebral atherosclerosis: Secondary | ICD-10-CM | POA: Diagnosis not present

## 2015-09-04 DIAGNOSIS — J449 Chronic obstructive pulmonary disease, unspecified: Secondary | ICD-10-CM | POA: Diagnosis not present

## 2015-09-04 DIAGNOSIS — R091 Pleurisy: Secondary | ICD-10-CM | POA: Diagnosis not present

## 2015-09-04 DIAGNOSIS — R079 Chest pain, unspecified: Secondary | ICD-10-CM | POA: Diagnosis not present

## 2015-09-05 DIAGNOSIS — N186 End stage renal disease: Secondary | ICD-10-CM | POA: Diagnosis not present

## 2015-09-05 DIAGNOSIS — R071 Chest pain on breathing: Secondary | ICD-10-CM | POA: Diagnosis not present

## 2015-09-05 DIAGNOSIS — I4891 Unspecified atrial fibrillation: Secondary | ICD-10-CM | POA: Diagnosis not present

## 2015-09-05 DIAGNOSIS — Z86718 Personal history of other venous thrombosis and embolism: Secondary | ICD-10-CM | POA: Diagnosis not present

## 2015-09-05 DIAGNOSIS — I10 Essential (primary) hypertension: Secondary | ICD-10-CM | POA: Diagnosis not present

## 2015-09-05 DIAGNOSIS — Z452 Encounter for adjustment and management of vascular access device: Secondary | ICD-10-CM | POA: Diagnosis not present

## 2015-09-05 DIAGNOSIS — Z7901 Long term (current) use of anticoagulants: Secondary | ICD-10-CM | POA: Diagnosis not present

## 2015-09-06 DIAGNOSIS — R071 Chest pain on breathing: Secondary | ICD-10-CM | POA: Diagnosis not present

## 2015-09-06 DIAGNOSIS — I340 Nonrheumatic mitral (valve) insufficiency: Secondary | ICD-10-CM | POA: Diagnosis not present

## 2015-09-06 DIAGNOSIS — I517 Cardiomegaly: Secondary | ICD-10-CM | POA: Diagnosis not present

## 2015-09-06 DIAGNOSIS — D649 Anemia, unspecified: Secondary | ICD-10-CM | POA: Diagnosis not present

## 2015-09-06 DIAGNOSIS — N189 Chronic kidney disease, unspecified: Secondary | ICD-10-CM | POA: Diagnosis not present

## 2015-09-06 DIAGNOSIS — N186 End stage renal disease: Secondary | ICD-10-CM | POA: Diagnosis not present

## 2015-09-06 DIAGNOSIS — Z86718 Personal history of other venous thrombosis and embolism: Secondary | ICD-10-CM | POA: Diagnosis not present

## 2015-09-06 DIAGNOSIS — R091 Pleurisy: Secondary | ICD-10-CM | POA: Diagnosis not present

## 2015-09-06 DIAGNOSIS — R079 Chest pain, unspecified: Secondary | ICD-10-CM | POA: Diagnosis not present

## 2015-09-06 DIAGNOSIS — I10 Essential (primary) hypertension: Secondary | ICD-10-CM | POA: Diagnosis not present

## 2015-09-06 DIAGNOSIS — I4891 Unspecified atrial fibrillation: Secondary | ICD-10-CM | POA: Diagnosis not present

## 2015-09-07 DIAGNOSIS — Z992 Dependence on renal dialysis: Secondary | ICD-10-CM | POA: Diagnosis not present

## 2015-09-07 DIAGNOSIS — R091 Pleurisy: Secondary | ICD-10-CM | POA: Diagnosis not present

## 2015-09-07 DIAGNOSIS — N189 Chronic kidney disease, unspecified: Secondary | ICD-10-CM | POA: Diagnosis not present

## 2015-09-07 DIAGNOSIS — D649 Anemia, unspecified: Secondary | ICD-10-CM | POA: Diagnosis not present

## 2015-09-07 DIAGNOSIS — Z86718 Personal history of other venous thrombosis and embolism: Secondary | ICD-10-CM | POA: Diagnosis not present

## 2015-09-07 DIAGNOSIS — R079 Chest pain, unspecified: Secondary | ICD-10-CM | POA: Diagnosis not present

## 2015-09-07 DIAGNOSIS — N186 End stage renal disease: Secondary | ICD-10-CM | POA: Diagnosis not present

## 2015-09-10 DIAGNOSIS — N2581 Secondary hyperparathyroidism of renal origin: Secondary | ICD-10-CM | POA: Diagnosis not present

## 2015-09-10 DIAGNOSIS — N186 End stage renal disease: Secondary | ICD-10-CM | POA: Diagnosis not present

## 2015-09-10 DIAGNOSIS — I82409 Acute embolism and thrombosis of unspecified deep veins of unspecified lower extremity: Secondary | ICD-10-CM | POA: Diagnosis not present

## 2015-09-10 DIAGNOSIS — D509 Iron deficiency anemia, unspecified: Secondary | ICD-10-CM | POA: Diagnosis not present

## 2015-09-10 DIAGNOSIS — D631 Anemia in chronic kidney disease: Secondary | ICD-10-CM | POA: Diagnosis not present

## 2015-09-13 DIAGNOSIS — I82409 Acute embolism and thrombosis of unspecified deep veins of unspecified lower extremity: Secondary | ICD-10-CM | POA: Diagnosis not present

## 2015-09-13 DIAGNOSIS — N186 End stage renal disease: Secondary | ICD-10-CM | POA: Diagnosis not present

## 2015-09-13 DIAGNOSIS — T80219A Unspecified infection due to central venous catheter, initial encounter: Secondary | ICD-10-CM | POA: Diagnosis not present

## 2015-09-13 DIAGNOSIS — N2581 Secondary hyperparathyroidism of renal origin: Secondary | ICD-10-CM | POA: Diagnosis not present

## 2015-09-13 DIAGNOSIS — D631 Anemia in chronic kidney disease: Secondary | ICD-10-CM | POA: Diagnosis not present

## 2015-09-15 ENCOUNTER — Ambulatory Visit (INDEPENDENT_AMBULATORY_CARE_PROVIDER_SITE_OTHER): Payer: Medicare Other | Admitting: Physician Assistant

## 2015-09-15 ENCOUNTER — Ambulatory Visit: Payer: Medicare Other

## 2015-09-15 VITALS — BP 157/52 | HR 77 | Wt 100.1 lb

## 2015-09-15 DIAGNOSIS — I480 Paroxysmal atrial fibrillation: Secondary | ICD-10-CM

## 2015-09-15 DIAGNOSIS — Z992 Dependence on renal dialysis: Secondary | ICD-10-CM

## 2015-09-15 DIAGNOSIS — N186 End stage renal disease: Secondary | ICD-10-CM | POA: Diagnosis not present

## 2015-09-15 DIAGNOSIS — R197 Diarrhea, unspecified: Secondary | ICD-10-CM | POA: Diagnosis not present

## 2015-09-15 DIAGNOSIS — D510 Vitamin B12 deficiency anemia due to intrinsic factor deficiency: Secondary | ICD-10-CM

## 2015-09-15 MED ORDER — METOPROLOL SUCCINATE ER 25 MG PO TB24
25.0000 mg | ORAL_TABLET | Freq: Every day | ORAL | Status: DC
Start: 2015-09-15 — End: 2015-10-12

## 2015-09-15 MED ORDER — CYANOCOBALAMIN 1000 MCG/ML IJ SOLN
1000.0000 ug | Freq: Once | INTRAMUSCULAR | Status: AC
  Administered 2015-09-15: 1000 ug via INTRAMUSCULAR

## 2015-09-15 NOTE — Progress Notes (Signed)
   Subjective:    Patient ID: Gabrielle Stafford, female    DOB: 06-26-1929, 82 y.o.   MRN: [id number]  HPI  Pt is an 82 yo female with chronic renal disease and on dialysis, hx of colon cancer and PE/DVT. She receives dialysis on mondays and fridays. She is here with her son. She laid down to go to bed on June 3rd and started having CP and feeling like her heart was racing. She was concerned she was having another blood clot. She went to [facility] ED on June 3rd and was kept until June 6th. MRI neck and Echo done. WBC, NA and K were normal. HgB was 10.1 on discharge. Heparin drip started to make sure INR was in therapeutic range. EKG noted in paroxysmal a.fib. cardizem was started. Diarrhea has been ongoing for years but worsened when start cardizem. Stool culture negative for blood and c.diff at [facility].  After discharge took one dose of Diltiazem 120mg  and diarrhea worsened. At times she was going every 15 minutes. After stopping Diltazem 120mg  diarrhea did improve to 3-4 times a day. She is not currently having any chest pain or arrthymia. On coumadin 4.5mg  daily.       Review of Systems  All other systems reviewed and are negative.      Objective:   Physical Exam  Constitutional: She is oriented to person, place, and time. She appears well-developed and well-nourished.  HENT:  Head: Normocephalic and atraumatic.  Cardiovascular: Normal rate, regular rhythm and normal heart sounds.   Pulmonary/Chest: Effort normal and breath sounds normal. She has no wheezes.  Neurological: She is alert and oriented to person, place, and time.  Skin: Skin is dry.  Psychiatric: She has a normal mood and affect. Her behavior is normal.          Assessment & Plan:  Paroxysmal a.fib- continue coumadin 4.5mg  daily. Will recheck Friday at dialysis. Discussed options to help with rate control. Pt and son would like to try a different class of medication but a very low dose. Started metoprolol ER 25mg  daily.  Discussed with patient the need for rate control.  Follow up with PCP in 2-3 weeks.   Pt request Geratric referral. Already placed by tonya for Dr. Linna Darner at [facility].   Chronic end stage renal disease- next dialysis will be Friday.   Pernicious anemia- b12 shot given today.

## 2015-09-16 ENCOUNTER — Telehealth: Payer: Self-pay | Admitting: Physician Assistant

## 2015-09-16 DIAGNOSIS — K529 Noninfective gastroenteritis and colitis, unspecified: Secondary | ICD-10-CM | POA: Insufficient documentation

## 2015-09-16 DIAGNOSIS — I480 Paroxysmal atrial fibrillation: Secondary | ICD-10-CM | POA: Insufficient documentation

## 2015-09-16 NOTE — Telephone Encounter (Signed)
Gabrielle Stafford, patient wanted [name] to call dialysis center and find out the name of the medication that she was given via IV that helped with [facility]

## 2015-09-17 DIAGNOSIS — D631 Anemia in chronic kidney disease: Secondary | ICD-10-CM | POA: Diagnosis not present

## 2015-09-17 DIAGNOSIS — N186 End stage renal disease: Secondary | ICD-10-CM | POA: Diagnosis not present

## 2015-09-17 DIAGNOSIS — N2581 Secondary hyperparathyroidism of renal origin: Secondary | ICD-10-CM | POA: Diagnosis not present

## 2015-09-17 DIAGNOSIS — T80219A Unspecified infection due to central venous catheter, initial encounter: Secondary | ICD-10-CM | POA: Diagnosis not present

## 2015-09-20 DIAGNOSIS — N186 End stage renal disease: Secondary | ICD-10-CM | POA: Diagnosis not present

## 2015-09-20 DIAGNOSIS — I82409 Acute embolism and thrombosis of unspecified deep veins of unspecified lower extremity: Secondary | ICD-10-CM | POA: Diagnosis not present

## 2015-09-20 DIAGNOSIS — T80219A Unspecified infection due to central venous catheter, initial encounter: Secondary | ICD-10-CM | POA: Diagnosis not present

## 2015-09-20 DIAGNOSIS — N2581 Secondary hyperparathyroidism of renal origin: Secondary | ICD-10-CM | POA: Diagnosis not present

## 2015-09-20 DIAGNOSIS — D631 Anemia in chronic kidney disease: Secondary | ICD-10-CM | POA: Diagnosis not present

## 2015-09-24 DIAGNOSIS — D631 Anemia in chronic kidney disease: Secondary | ICD-10-CM | POA: Diagnosis not present

## 2015-09-24 DIAGNOSIS — N186 End stage renal disease: Secondary | ICD-10-CM | POA: Diagnosis not present

## 2015-09-24 DIAGNOSIS — N2581 Secondary hyperparathyroidism of renal origin: Secondary | ICD-10-CM | POA: Diagnosis not present

## 2015-09-27 DIAGNOSIS — D631 Anemia in chronic kidney disease: Secondary | ICD-10-CM | POA: Diagnosis not present

## 2015-09-27 DIAGNOSIS — N2581 Secondary hyperparathyroidism of renal origin: Secondary | ICD-10-CM | POA: Diagnosis not present

## 2015-09-27 DIAGNOSIS — N186 End stage renal disease: Secondary | ICD-10-CM | POA: Diagnosis not present

## 2015-09-27 DIAGNOSIS — I82409 Acute embolism and thrombosis of unspecified deep veins of unspecified lower extremity: Secondary | ICD-10-CM | POA: Diagnosis not present

## 2015-10-01 DIAGNOSIS — Z992 Dependence on renal dialysis: Secondary | ICD-10-CM | POA: Diagnosis not present

## 2015-10-01 DIAGNOSIS — N186 End stage renal disease: Secondary | ICD-10-CM | POA: Diagnosis not present

## 2015-10-01 DIAGNOSIS — D631 Anemia in chronic kidney disease: Secondary | ICD-10-CM | POA: Diagnosis not present

## 2015-10-01 DIAGNOSIS — N2581 Secondary hyperparathyroidism of renal origin: Secondary | ICD-10-CM | POA: Diagnosis not present

## 2015-10-04 DIAGNOSIS — N186 End stage renal disease: Secondary | ICD-10-CM | POA: Diagnosis not present

## 2015-10-04 DIAGNOSIS — D631 Anemia in chronic kidney disease: Secondary | ICD-10-CM | POA: Diagnosis not present

## 2015-10-04 DIAGNOSIS — D509 Iron deficiency anemia, unspecified: Secondary | ICD-10-CM | POA: Diagnosis not present

## 2015-10-04 DIAGNOSIS — I82409 Acute embolism and thrombosis of unspecified deep veins of unspecified lower extremity: Secondary | ICD-10-CM | POA: Diagnosis not present

## 2015-10-04 DIAGNOSIS — N2581 Secondary hyperparathyroidism of renal origin: Secondary | ICD-10-CM | POA: Diagnosis not present

## 2015-10-08 DIAGNOSIS — D509 Iron deficiency anemia, unspecified: Secondary | ICD-10-CM | POA: Diagnosis not present

## 2015-10-08 DIAGNOSIS — D631 Anemia in chronic kidney disease: Secondary | ICD-10-CM | POA: Diagnosis not present

## 2015-10-08 DIAGNOSIS — N186 End stage renal disease: Secondary | ICD-10-CM | POA: Diagnosis not present

## 2015-10-08 DIAGNOSIS — N2581 Secondary hyperparathyroidism of renal origin: Secondary | ICD-10-CM | POA: Diagnosis not present

## 2015-10-11 DIAGNOSIS — N2581 Secondary hyperparathyroidism of renal origin: Secondary | ICD-10-CM | POA: Diagnosis not present

## 2015-10-11 DIAGNOSIS — D509 Iron deficiency anemia, unspecified: Secondary | ICD-10-CM | POA: Diagnosis not present

## 2015-10-11 DIAGNOSIS — I82409 Acute embolism and thrombosis of unspecified deep veins of unspecified lower extremity: Secondary | ICD-10-CM | POA: Diagnosis not present

## 2015-10-11 DIAGNOSIS — N186 End stage renal disease: Secondary | ICD-10-CM | POA: Diagnosis not present

## 2015-10-11 DIAGNOSIS — D631 Anemia in chronic kidney disease: Secondary | ICD-10-CM | POA: Diagnosis not present

## 2015-10-11 DIAGNOSIS — T80219A Unspecified infection due to central venous catheter, initial encounter: Secondary | ICD-10-CM | POA: Diagnosis not present

## 2015-10-12 ENCOUNTER — Other Ambulatory Visit: Payer: Self-pay | Admitting: Physician Assistant

## 2015-10-13 ENCOUNTER — Ambulatory Visit (INDEPENDENT_AMBULATORY_CARE_PROVIDER_SITE_OTHER): Payer: Medicare Other | Admitting: Physician Assistant

## 2015-10-13 VITALS — BP 136/69 | HR 83 | Wt 98.0 lb

## 2015-10-13 DIAGNOSIS — D510 Vitamin B12 deficiency anemia due to intrinsic factor deficiency: Secondary | ICD-10-CM

## 2015-10-13 MED ORDER — CYANOCOBALAMIN 1000 MCG/ML IJ SOLN
1000.0000 ug | Freq: Once | INTRAMUSCULAR | Status: AC
  Administered 2015-10-13: 1000 ug via INTRAMUSCULAR

## 2015-10-13 NOTE — Progress Notes (Signed)
Patient is here for a vitamin B 12 injection. Denies muscle cramps, weakness or irregular heart rate.   Patient tolerated injection well without complications. Patient advised to schedule next injection 30 days from today.

## 2015-10-15 DIAGNOSIS — R7881 Bacteremia: Secondary | ICD-10-CM | POA: Diagnosis not present

## 2015-10-15 DIAGNOSIS — N186 End stage renal disease: Secondary | ICD-10-CM | POA: Diagnosis not present

## 2015-10-15 DIAGNOSIS — D631 Anemia in chronic kidney disease: Secondary | ICD-10-CM | POA: Diagnosis not present

## 2015-10-15 DIAGNOSIS — N2581 Secondary hyperparathyroidism of renal origin: Secondary | ICD-10-CM | POA: Diagnosis not present

## 2015-10-18 DIAGNOSIS — I82612 Acute embolism and thrombosis of superficial veins of left upper extremity: Secondary | ICD-10-CM | POA: Diagnosis not present

## 2015-10-18 DIAGNOSIS — I871 Compression of vein: Secondary | ICD-10-CM | POA: Diagnosis not present

## 2015-10-18 DIAGNOSIS — T827XXA Infection and inflammatory reaction due to other cardiac and vascular devices, implants and grafts, initial encounter: Secondary | ICD-10-CM | POA: Diagnosis not present

## 2015-10-18 DIAGNOSIS — Z4901 Encounter for fitting and adjustment of extracorporeal dialysis catheter: Secondary | ICD-10-CM | POA: Diagnosis not present

## 2015-10-18 DIAGNOSIS — N186 End stage renal disease: Secondary | ICD-10-CM | POA: Diagnosis not present

## 2015-10-18 DIAGNOSIS — I878 Other specified disorders of veins: Secondary | ICD-10-CM | POA: Diagnosis not present

## 2015-10-18 DIAGNOSIS — T82828A Fibrosis of vascular prosthetic devices, implants and grafts, initial encounter: Secondary | ICD-10-CM | POA: Diagnosis not present

## 2015-10-18 DIAGNOSIS — Z452 Encounter for adjustment and management of vascular access device: Secondary | ICD-10-CM | POA: Diagnosis not present

## 2015-10-18 DIAGNOSIS — Z992 Dependence on renal dialysis: Secondary | ICD-10-CM | POA: Diagnosis not present

## 2015-10-19 DIAGNOSIS — N186 End stage renal disease: Secondary | ICD-10-CM | POA: Diagnosis not present

## 2015-10-19 DIAGNOSIS — N2581 Secondary hyperparathyroidism of renal origin: Secondary | ICD-10-CM | POA: Diagnosis not present

## 2015-10-19 DIAGNOSIS — I82409 Acute embolism and thrombosis of unspecified deep veins of unspecified lower extremity: Secondary | ICD-10-CM | POA: Diagnosis not present

## 2015-10-19 DIAGNOSIS — R7881 Bacteremia: Secondary | ICD-10-CM | POA: Diagnosis not present

## 2015-10-19 DIAGNOSIS — D631 Anemia in chronic kidney disease: Secondary | ICD-10-CM | POA: Diagnosis not present

## 2015-10-22 DIAGNOSIS — R7881 Bacteremia: Secondary | ICD-10-CM | POA: Diagnosis not present

## 2015-10-22 DIAGNOSIS — D631 Anemia in chronic kidney disease: Secondary | ICD-10-CM | POA: Diagnosis not present

## 2015-10-22 DIAGNOSIS — N186 End stage renal disease: Secondary | ICD-10-CM | POA: Diagnosis not present

## 2015-10-22 DIAGNOSIS — N2581 Secondary hyperparathyroidism of renal origin: Secondary | ICD-10-CM | POA: Diagnosis not present

## 2015-10-22 DIAGNOSIS — T80219A Unspecified infection due to central venous catheter, initial encounter: Secondary | ICD-10-CM | POA: Diagnosis not present

## 2015-10-25 DIAGNOSIS — T80219A Unspecified infection due to central venous catheter, initial encounter: Secondary | ICD-10-CM | POA: Diagnosis not present

## 2015-10-25 DIAGNOSIS — N186 End stage renal disease: Secondary | ICD-10-CM | POA: Diagnosis not present

## 2015-10-25 DIAGNOSIS — N2581 Secondary hyperparathyroidism of renal origin: Secondary | ICD-10-CM | POA: Diagnosis not present

## 2015-10-25 DIAGNOSIS — D631 Anemia in chronic kidney disease: Secondary | ICD-10-CM | POA: Diagnosis not present

## 2015-10-25 DIAGNOSIS — R7881 Bacteremia: Secondary | ICD-10-CM | POA: Diagnosis not present

## 2015-10-25 DIAGNOSIS — I82409 Acute embolism and thrombosis of unspecified deep veins of unspecified lower extremity: Secondary | ICD-10-CM | POA: Diagnosis not present

## 2015-10-29 DIAGNOSIS — D631 Anemia in chronic kidney disease: Secondary | ICD-10-CM | POA: Diagnosis not present

## 2015-10-29 DIAGNOSIS — R7881 Bacteremia: Secondary | ICD-10-CM | POA: Diagnosis not present

## 2015-10-29 DIAGNOSIS — N2581 Secondary hyperparathyroidism of renal origin: Secondary | ICD-10-CM | POA: Diagnosis not present

## 2015-10-29 DIAGNOSIS — N186 End stage renal disease: Secondary | ICD-10-CM | POA: Diagnosis not present

## 2015-10-29 DIAGNOSIS — T80219A Unspecified infection due to central venous catheter, initial encounter: Secondary | ICD-10-CM | POA: Diagnosis not present

## 2015-11-01 DIAGNOSIS — N186 End stage renal disease: Secondary | ICD-10-CM | POA: Diagnosis not present

## 2015-11-01 DIAGNOSIS — T80219A Unspecified infection due to central venous catheter, initial encounter: Secondary | ICD-10-CM | POA: Diagnosis not present

## 2015-11-01 DIAGNOSIS — Z992 Dependence on renal dialysis: Secondary | ICD-10-CM | POA: Diagnosis not present

## 2015-11-01 DIAGNOSIS — R7881 Bacteremia: Secondary | ICD-10-CM | POA: Diagnosis not present

## 2015-11-01 DIAGNOSIS — I82409 Acute embolism and thrombosis of unspecified deep veins of unspecified lower extremity: Secondary | ICD-10-CM | POA: Diagnosis not present

## 2015-11-01 DIAGNOSIS — D631 Anemia in chronic kidney disease: Secondary | ICD-10-CM | POA: Diagnosis not present

## 2015-11-01 DIAGNOSIS — N2581 Secondary hyperparathyroidism of renal origin: Secondary | ICD-10-CM | POA: Diagnosis not present

## 2015-11-05 DIAGNOSIS — D509 Iron deficiency anemia, unspecified: Secondary | ICD-10-CM | POA: Diagnosis not present

## 2015-11-05 DIAGNOSIS — D631 Anemia in chronic kidney disease: Secondary | ICD-10-CM | POA: Diagnosis not present

## 2015-11-05 DIAGNOSIS — N186 End stage renal disease: Secondary | ICD-10-CM | POA: Diagnosis not present

## 2015-11-05 DIAGNOSIS — N2581 Secondary hyperparathyroidism of renal origin: Secondary | ICD-10-CM | POA: Diagnosis not present

## 2015-11-08 DIAGNOSIS — D631 Anemia in chronic kidney disease: Secondary | ICD-10-CM | POA: Diagnosis not present

## 2015-11-08 DIAGNOSIS — D509 Iron deficiency anemia, unspecified: Secondary | ICD-10-CM | POA: Diagnosis not present

## 2015-11-08 DIAGNOSIS — I82409 Acute embolism and thrombosis of unspecified deep veins of unspecified lower extremity: Secondary | ICD-10-CM | POA: Diagnosis not present

## 2015-11-08 DIAGNOSIS — N2581 Secondary hyperparathyroidism of renal origin: Secondary | ICD-10-CM | POA: Diagnosis not present

## 2015-11-08 DIAGNOSIS — N186 End stage renal disease: Secondary | ICD-10-CM | POA: Diagnosis not present

## 2015-11-11 ENCOUNTER — Ambulatory Visit (INDEPENDENT_AMBULATORY_CARE_PROVIDER_SITE_OTHER): Payer: Medicare Other | Admitting: Family Medicine

## 2015-11-11 VITALS — BP 136/63 | HR 77 | Wt 95.0 lb

## 2015-11-11 DIAGNOSIS — D510 Vitamin B12 deficiency anemia due to intrinsic factor deficiency: Secondary | ICD-10-CM | POA: Diagnosis not present

## 2015-11-11 DIAGNOSIS — R030 Elevated blood-pressure reading, without diagnosis of hypertension: Secondary | ICD-10-CM

## 2015-11-11 DIAGNOSIS — IMO0001 Reserved for inherently not codable concepts without codable children: Secondary | ICD-10-CM

## 2015-11-11 MED ORDER — CYANOCOBALAMIN 1000 MCG/ML IJ SOLN
1000.0000 ug | Freq: Once | INTRAMUSCULAR | Status: AC
  Administered 2015-11-11: 1000 ug via INTRAMUSCULAR

## 2015-11-11 NOTE — Progress Notes (Signed)
Agree with below.  We will fax over blood pressures to her nephrologist. Her pressure did come down which is reassuring.  Beatrice Lecher, MD

## 2015-11-11 NOTE — Progress Notes (Signed)
Patient came into clinic today for Vit B12 injection. Pt states recently her dialysis port got infected and a new port had to be placed. Pt reports she was put on 3 different antibiotics to find one she could tolerate, all 3 gave her diarrhea. Pt reports eventually they decided to just stop antibiotics altogether. Pt reports she has lost some weight due to that and is working on getting her strength back up. Pt's BP was elevated in office today (162/62, 160/58), she states she takes her medications at night. Upon further review of our current medication list, she is unaware of the exact names of her medications. Printed a copy of her med list to take home and compare with her bottles. Pt also states she has a dialysis appointment tomorrow so she will take the list there to compare with their records as well. Pt advised to contact clinic after dialysis appointment so we can update her records with any changes. After sitting for about 10 minutes, BP was rechecked and was better (136/63). No changes at this time until we get her updated Rx list. Pt tolerated her B12 injection in left deltoid well, no immediate complications. Advised to schedule a 4 week follow up for next injection, this was scheduled prior to leaving clinic. No further questions/concerns at this time.

## 2015-11-12 DIAGNOSIS — D631 Anemia in chronic kidney disease: Secondary | ICD-10-CM | POA: Diagnosis not present

## 2015-11-12 DIAGNOSIS — N186 End stage renal disease: Secondary | ICD-10-CM | POA: Diagnosis not present

## 2015-11-12 DIAGNOSIS — N2581 Secondary hyperparathyroidism of renal origin: Secondary | ICD-10-CM | POA: Diagnosis not present

## 2015-11-15 DIAGNOSIS — N2581 Secondary hyperparathyroidism of renal origin: Secondary | ICD-10-CM | POA: Diagnosis not present

## 2015-11-15 DIAGNOSIS — D631 Anemia in chronic kidney disease: Secondary | ICD-10-CM | POA: Diagnosis not present

## 2015-11-15 DIAGNOSIS — N186 End stage renal disease: Secondary | ICD-10-CM | POA: Diagnosis not present

## 2015-11-15 DIAGNOSIS — I82409 Acute embolism and thrombosis of unspecified deep veins of unspecified lower extremity: Secondary | ICD-10-CM | POA: Diagnosis not present

## 2015-11-19 DIAGNOSIS — N2581 Secondary hyperparathyroidism of renal origin: Secondary | ICD-10-CM | POA: Diagnosis not present

## 2015-11-19 DIAGNOSIS — D631 Anemia in chronic kidney disease: Secondary | ICD-10-CM | POA: Diagnosis not present

## 2015-11-19 DIAGNOSIS — N186 End stage renal disease: Secondary | ICD-10-CM | POA: Diagnosis not present

## 2015-11-22 DIAGNOSIS — I82409 Acute embolism and thrombosis of unspecified deep veins of unspecified lower extremity: Secondary | ICD-10-CM | POA: Diagnosis not present

## 2015-11-22 DIAGNOSIS — N186 End stage renal disease: Secondary | ICD-10-CM | POA: Diagnosis not present

## 2015-11-22 DIAGNOSIS — D631 Anemia in chronic kidney disease: Secondary | ICD-10-CM | POA: Diagnosis not present

## 2015-11-22 DIAGNOSIS — N2581 Secondary hyperparathyroidism of renal origin: Secondary | ICD-10-CM | POA: Diagnosis not present

## 2015-11-26 DIAGNOSIS — N186 End stage renal disease: Secondary | ICD-10-CM | POA: Diagnosis not present

## 2015-11-26 DIAGNOSIS — N2581 Secondary hyperparathyroidism of renal origin: Secondary | ICD-10-CM | POA: Diagnosis not present

## 2015-11-26 DIAGNOSIS — D631 Anemia in chronic kidney disease: Secondary | ICD-10-CM | POA: Diagnosis not present

## 2015-11-29 DIAGNOSIS — N2581 Secondary hyperparathyroidism of renal origin: Secondary | ICD-10-CM | POA: Diagnosis not present

## 2015-11-29 DIAGNOSIS — N186 End stage renal disease: Secondary | ICD-10-CM | POA: Diagnosis not present

## 2015-11-29 DIAGNOSIS — I82409 Acute embolism and thrombosis of unspecified deep veins of unspecified lower extremity: Secondary | ICD-10-CM | POA: Diagnosis not present

## 2015-11-29 DIAGNOSIS — D631 Anemia in chronic kidney disease: Secondary | ICD-10-CM | POA: Diagnosis not present

## 2015-12-02 DIAGNOSIS — N186 End stage renal disease: Secondary | ICD-10-CM | POA: Diagnosis not present

## 2015-12-02 DIAGNOSIS — Z992 Dependence on renal dialysis: Secondary | ICD-10-CM | POA: Diagnosis not present

## 2015-12-03 DIAGNOSIS — D631 Anemia in chronic kidney disease: Secondary | ICD-10-CM | POA: Diagnosis not present

## 2015-12-03 DIAGNOSIS — D509 Iron deficiency anemia, unspecified: Secondary | ICD-10-CM | POA: Diagnosis not present

## 2015-12-03 DIAGNOSIS — N2581 Secondary hyperparathyroidism of renal origin: Secondary | ICD-10-CM | POA: Diagnosis not present

## 2015-12-03 DIAGNOSIS — N186 End stage renal disease: Secondary | ICD-10-CM | POA: Diagnosis not present

## 2015-12-06 DIAGNOSIS — D631 Anemia in chronic kidney disease: Secondary | ICD-10-CM | POA: Diagnosis not present

## 2015-12-06 DIAGNOSIS — N186 End stage renal disease: Secondary | ICD-10-CM | POA: Diagnosis not present

## 2015-12-06 DIAGNOSIS — N2581 Secondary hyperparathyroidism of renal origin: Secondary | ICD-10-CM | POA: Diagnosis not present

## 2015-12-06 DIAGNOSIS — I82409 Acute embolism and thrombosis of unspecified deep veins of unspecified lower extremity: Secondary | ICD-10-CM | POA: Diagnosis not present

## 2015-12-06 DIAGNOSIS — D509 Iron deficiency anemia, unspecified: Secondary | ICD-10-CM | POA: Diagnosis not present

## 2015-12-09 ENCOUNTER — Ambulatory Visit: Payer: Medicare Other

## 2015-12-10 DIAGNOSIS — D631 Anemia in chronic kidney disease: Secondary | ICD-10-CM | POA: Diagnosis not present

## 2015-12-10 DIAGNOSIS — N186 End stage renal disease: Secondary | ICD-10-CM | POA: Diagnosis not present

## 2015-12-10 DIAGNOSIS — N2581 Secondary hyperparathyroidism of renal origin: Secondary | ICD-10-CM | POA: Diagnosis not present

## 2015-12-10 DIAGNOSIS — D509 Iron deficiency anemia, unspecified: Secondary | ICD-10-CM | POA: Diagnosis not present

## 2015-12-13 DIAGNOSIS — I82409 Acute embolism and thrombosis of unspecified deep veins of unspecified lower extremity: Secondary | ICD-10-CM | POA: Diagnosis not present

## 2015-12-13 DIAGNOSIS — N2581 Secondary hyperparathyroidism of renal origin: Secondary | ICD-10-CM | POA: Diagnosis not present

## 2015-12-13 DIAGNOSIS — N186 End stage renal disease: Secondary | ICD-10-CM | POA: Diagnosis not present

## 2015-12-13 DIAGNOSIS — D509 Iron deficiency anemia, unspecified: Secondary | ICD-10-CM | POA: Diagnosis not present

## 2015-12-13 DIAGNOSIS — D631 Anemia in chronic kidney disease: Secondary | ICD-10-CM | POA: Diagnosis not present

## 2015-12-16 ENCOUNTER — Ambulatory Visit (INDEPENDENT_AMBULATORY_CARE_PROVIDER_SITE_OTHER): Payer: Medicare Other | Admitting: Family Medicine

## 2015-12-16 VITALS — BP 120/69 | HR 82

## 2015-12-16 DIAGNOSIS — D510 Vitamin B12 deficiency anemia due to intrinsic factor deficiency: Secondary | ICD-10-CM

## 2015-12-16 MED ORDER — CYANOCOBALAMIN 1000 MCG/ML IJ SOLN
1000.0000 ug | Freq: Once | INTRAMUSCULAR | Status: AC
  Administered 2015-12-16: 1000 ug via INTRAMUSCULAR

## 2015-12-16 NOTE — Progress Notes (Signed)
B12 injection given RD without complications.  Agree with above.  Beatrice Lecher, MD

## 2015-12-17 DIAGNOSIS — N186 End stage renal disease: Secondary | ICD-10-CM | POA: Diagnosis not present

## 2015-12-17 DIAGNOSIS — D509 Iron deficiency anemia, unspecified: Secondary | ICD-10-CM | POA: Diagnosis not present

## 2015-12-17 DIAGNOSIS — N2581 Secondary hyperparathyroidism of renal origin: Secondary | ICD-10-CM | POA: Diagnosis not present

## 2015-12-17 DIAGNOSIS — D631 Anemia in chronic kidney disease: Secondary | ICD-10-CM | POA: Diagnosis not present

## 2015-12-20 DIAGNOSIS — D509 Iron deficiency anemia, unspecified: Secondary | ICD-10-CM | POA: Diagnosis not present

## 2015-12-20 DIAGNOSIS — N186 End stage renal disease: Secondary | ICD-10-CM | POA: Diagnosis not present

## 2015-12-20 DIAGNOSIS — I82409 Acute embolism and thrombosis of unspecified deep veins of unspecified lower extremity: Secondary | ICD-10-CM | POA: Diagnosis not present

## 2015-12-20 DIAGNOSIS — N2581 Secondary hyperparathyroidism of renal origin: Secondary | ICD-10-CM | POA: Diagnosis not present

## 2015-12-20 DIAGNOSIS — D631 Anemia in chronic kidney disease: Secondary | ICD-10-CM | POA: Diagnosis not present

## 2015-12-24 DIAGNOSIS — D631 Anemia in chronic kidney disease: Secondary | ICD-10-CM | POA: Diagnosis not present

## 2015-12-24 DIAGNOSIS — D509 Iron deficiency anemia, unspecified: Secondary | ICD-10-CM | POA: Diagnosis not present

## 2015-12-24 DIAGNOSIS — N2581 Secondary hyperparathyroidism of renal origin: Secondary | ICD-10-CM | POA: Diagnosis not present

## 2015-12-24 DIAGNOSIS — N186 End stage renal disease: Secondary | ICD-10-CM | POA: Diagnosis not present

## 2015-12-27 DIAGNOSIS — D631 Anemia in chronic kidney disease: Secondary | ICD-10-CM | POA: Diagnosis not present

## 2015-12-27 DIAGNOSIS — D509 Iron deficiency anemia, unspecified: Secondary | ICD-10-CM | POA: Diagnosis not present

## 2015-12-27 DIAGNOSIS — N186 End stage renal disease: Secondary | ICD-10-CM | POA: Diagnosis not present

## 2015-12-27 DIAGNOSIS — N2581 Secondary hyperparathyroidism of renal origin: Secondary | ICD-10-CM | POA: Diagnosis not present

## 2015-12-27 DIAGNOSIS — I82409 Acute embolism and thrombosis of unspecified deep veins of unspecified lower extremity: Secondary | ICD-10-CM | POA: Diagnosis not present

## 2015-12-31 DIAGNOSIS — D509 Iron deficiency anemia, unspecified: Secondary | ICD-10-CM | POA: Diagnosis not present

## 2015-12-31 DIAGNOSIS — D631 Anemia in chronic kidney disease: Secondary | ICD-10-CM | POA: Diagnosis not present

## 2015-12-31 DIAGNOSIS — N186 End stage renal disease: Secondary | ICD-10-CM | POA: Diagnosis not present

## 2015-12-31 DIAGNOSIS — N2581 Secondary hyperparathyroidism of renal origin: Secondary | ICD-10-CM | POA: Diagnosis not present

## 2016-01-01 DIAGNOSIS — N186 End stage renal disease: Secondary | ICD-10-CM | POA: Diagnosis not present

## 2016-01-01 DIAGNOSIS — Z992 Dependence on renal dialysis: Secondary | ICD-10-CM | POA: Diagnosis not present

## 2016-01-03 DIAGNOSIS — D509 Iron deficiency anemia, unspecified: Secondary | ICD-10-CM | POA: Diagnosis not present

## 2016-01-03 DIAGNOSIS — N2581 Secondary hyperparathyroidism of renal origin: Secondary | ICD-10-CM | POA: Diagnosis not present

## 2016-01-03 DIAGNOSIS — D631 Anemia in chronic kidney disease: Secondary | ICD-10-CM | POA: Diagnosis not present

## 2016-01-03 DIAGNOSIS — I82409 Acute embolism and thrombosis of unspecified deep veins of unspecified lower extremity: Secondary | ICD-10-CM | POA: Diagnosis not present

## 2016-01-03 DIAGNOSIS — Z23 Encounter for immunization: Secondary | ICD-10-CM | POA: Diagnosis not present

## 2016-01-03 DIAGNOSIS — N186 End stage renal disease: Secondary | ICD-10-CM | POA: Diagnosis not present

## 2016-01-07 DIAGNOSIS — N2581 Secondary hyperparathyroidism of renal origin: Secondary | ICD-10-CM | POA: Diagnosis not present

## 2016-01-07 DIAGNOSIS — N186 End stage renal disease: Secondary | ICD-10-CM | POA: Diagnosis not present

## 2016-01-07 DIAGNOSIS — D631 Anemia in chronic kidney disease: Secondary | ICD-10-CM | POA: Diagnosis not present

## 2016-01-07 DIAGNOSIS — Z23 Encounter for immunization: Secondary | ICD-10-CM | POA: Diagnosis not present

## 2016-01-07 DIAGNOSIS — D509 Iron deficiency anemia, unspecified: Secondary | ICD-10-CM | POA: Diagnosis not present

## 2016-01-10 DIAGNOSIS — N2581 Secondary hyperparathyroidism of renal origin: Secondary | ICD-10-CM | POA: Diagnosis not present

## 2016-01-10 DIAGNOSIS — N186 End stage renal disease: Secondary | ICD-10-CM | POA: Diagnosis not present

## 2016-01-10 DIAGNOSIS — I82409 Acute embolism and thrombosis of unspecified deep veins of unspecified lower extremity: Secondary | ICD-10-CM | POA: Diagnosis not present

## 2016-01-10 DIAGNOSIS — D631 Anemia in chronic kidney disease: Secondary | ICD-10-CM | POA: Diagnosis not present

## 2016-01-10 DIAGNOSIS — D509 Iron deficiency anemia, unspecified: Secondary | ICD-10-CM | POA: Diagnosis not present

## 2016-01-10 DIAGNOSIS — Z23 Encounter for immunization: Secondary | ICD-10-CM | POA: Diagnosis not present

## 2016-01-12 ENCOUNTER — Ambulatory Visit: Payer: Medicare Other

## 2016-01-12 ENCOUNTER — Ambulatory Visit (INDEPENDENT_AMBULATORY_CARE_PROVIDER_SITE_OTHER): Payer: Medicare Other | Admitting: Osteopathic Medicine

## 2016-01-12 VITALS — BP 130/68 | HR 82

## 2016-01-12 DIAGNOSIS — D510 Vitamin B12 deficiency anemia due to intrinsic factor deficiency: Secondary | ICD-10-CM

## 2016-01-12 MED ORDER — CYANOCOBALAMIN 1000 MCG/ML IJ SOLN
1000.0000 ug | Freq: Once | INTRAMUSCULAR | Status: AC
  Administered 2016-01-12: 1000 ug via INTRAMUSCULAR

## 2016-01-12 NOTE — Progress Notes (Signed)
B12 injection given LD without complication.  Pt to return in 4 weeks.

## 2016-01-14 DIAGNOSIS — D631 Anemia in chronic kidney disease: Secondary | ICD-10-CM | POA: Diagnosis not present

## 2016-01-14 DIAGNOSIS — N2581 Secondary hyperparathyroidism of renal origin: Secondary | ICD-10-CM | POA: Diagnosis not present

## 2016-01-14 DIAGNOSIS — N186 End stage renal disease: Secondary | ICD-10-CM | POA: Diagnosis not present

## 2016-01-14 DIAGNOSIS — D509 Iron deficiency anemia, unspecified: Secondary | ICD-10-CM | POA: Diagnosis not present

## 2016-01-17 DIAGNOSIS — D631 Anemia in chronic kidney disease: Secondary | ICD-10-CM | POA: Diagnosis not present

## 2016-01-17 DIAGNOSIS — D509 Iron deficiency anemia, unspecified: Secondary | ICD-10-CM | POA: Diagnosis not present

## 2016-01-17 DIAGNOSIS — N186 End stage renal disease: Secondary | ICD-10-CM | POA: Diagnosis not present

## 2016-01-17 DIAGNOSIS — N2581 Secondary hyperparathyroidism of renal origin: Secondary | ICD-10-CM | POA: Diagnosis not present

## 2016-01-17 DIAGNOSIS — I82409 Acute embolism and thrombosis of unspecified deep veins of unspecified lower extremity: Secondary | ICD-10-CM | POA: Diagnosis not present

## 2016-01-21 DIAGNOSIS — N2581 Secondary hyperparathyroidism of renal origin: Secondary | ICD-10-CM | POA: Diagnosis not present

## 2016-01-21 DIAGNOSIS — N186 End stage renal disease: Secondary | ICD-10-CM | POA: Diagnosis not present

## 2016-01-21 DIAGNOSIS — D509 Iron deficiency anemia, unspecified: Secondary | ICD-10-CM | POA: Diagnosis not present

## 2016-01-21 DIAGNOSIS — D631 Anemia in chronic kidney disease: Secondary | ICD-10-CM | POA: Diagnosis not present

## 2016-01-24 DIAGNOSIS — I82409 Acute embolism and thrombosis of unspecified deep veins of unspecified lower extremity: Secondary | ICD-10-CM | POA: Diagnosis not present

## 2016-01-24 DIAGNOSIS — N2581 Secondary hyperparathyroidism of renal origin: Secondary | ICD-10-CM | POA: Diagnosis not present

## 2016-01-24 DIAGNOSIS — D631 Anemia in chronic kidney disease: Secondary | ICD-10-CM | POA: Diagnosis not present

## 2016-01-24 DIAGNOSIS — N186 End stage renal disease: Secondary | ICD-10-CM | POA: Diagnosis not present

## 2016-01-28 DIAGNOSIS — N2581 Secondary hyperparathyroidism of renal origin: Secondary | ICD-10-CM | POA: Diagnosis not present

## 2016-01-28 DIAGNOSIS — N186 End stage renal disease: Secondary | ICD-10-CM | POA: Diagnosis not present

## 2016-01-28 DIAGNOSIS — D631 Anemia in chronic kidney disease: Secondary | ICD-10-CM | POA: Diagnosis not present

## 2016-01-31 DIAGNOSIS — N186 End stage renal disease: Secondary | ICD-10-CM | POA: Diagnosis not present

## 2016-01-31 DIAGNOSIS — I82409 Acute embolism and thrombosis of unspecified deep veins of unspecified lower extremity: Secondary | ICD-10-CM | POA: Diagnosis not present

## 2016-01-31 DIAGNOSIS — D631 Anemia in chronic kidney disease: Secondary | ICD-10-CM | POA: Diagnosis not present

## 2016-01-31 DIAGNOSIS — N2581 Secondary hyperparathyroidism of renal origin: Secondary | ICD-10-CM | POA: Diagnosis not present

## 2016-02-01 DIAGNOSIS — N186 End stage renal disease: Secondary | ICD-10-CM | POA: Diagnosis not present

## 2016-02-01 DIAGNOSIS — Z992 Dependence on renal dialysis: Secondary | ICD-10-CM | POA: Diagnosis not present

## 2016-02-04 DIAGNOSIS — D509 Iron deficiency anemia, unspecified: Secondary | ICD-10-CM | POA: Diagnosis not present

## 2016-02-04 DIAGNOSIS — N2581 Secondary hyperparathyroidism of renal origin: Secondary | ICD-10-CM | POA: Diagnosis not present

## 2016-02-04 DIAGNOSIS — N186 End stage renal disease: Secondary | ICD-10-CM | POA: Diagnosis not present

## 2016-02-04 DIAGNOSIS — D631 Anemia in chronic kidney disease: Secondary | ICD-10-CM | POA: Diagnosis not present

## 2016-02-07 DIAGNOSIS — D509 Iron deficiency anemia, unspecified: Secondary | ICD-10-CM | POA: Diagnosis not present

## 2016-02-07 DIAGNOSIS — D631 Anemia in chronic kidney disease: Secondary | ICD-10-CM | POA: Diagnosis not present

## 2016-02-07 DIAGNOSIS — I82409 Acute embolism and thrombosis of unspecified deep veins of unspecified lower extremity: Secondary | ICD-10-CM | POA: Diagnosis not present

## 2016-02-07 DIAGNOSIS — N186 End stage renal disease: Secondary | ICD-10-CM | POA: Diagnosis not present

## 2016-02-07 DIAGNOSIS — N2581 Secondary hyperparathyroidism of renal origin: Secondary | ICD-10-CM | POA: Diagnosis not present

## 2016-02-09 ENCOUNTER — Ambulatory Visit (INDEPENDENT_AMBULATORY_CARE_PROVIDER_SITE_OTHER): Payer: Medicare Other | Admitting: Family Medicine

## 2016-02-09 VITALS — BP 158/70 | HR 76 | Wt 101.0 lb

## 2016-02-09 DIAGNOSIS — D510 Vitamin B12 deficiency anemia due to intrinsic factor deficiency: Secondary | ICD-10-CM

## 2016-02-09 DIAGNOSIS — D229 Melanocytic nevi, unspecified: Secondary | ICD-10-CM

## 2016-02-09 MED ORDER — CYANOCOBALAMIN 1000 MCG/ML IJ SOLN
1000.0000 ug | Freq: Once | INTRAMUSCULAR | Status: AC
  Administered 2016-02-09: 1000 ug via INTRAMUSCULAR

## 2016-02-09 NOTE — Progress Notes (Signed)
Patient is here for a vitamin B 12 injection. Denies muscle cramps, weakness or irregular heart rate. Patients BP was elevated in clinic today, states she takes all of her medications at night before bed. Went over BP with Provider in office, no changes made at this time. Patient tolerated injection in right deltoid well without complications. Patient advised to schedule next injection 30 days from today. While in clinic today Patient reports she has multiple dark colored moles on her back and would like a referral to dermatology. Patient reports she has been to the dermatologist in [place] before and would like to go back there. Will place referral.

## 2016-02-11 DIAGNOSIS — N2581 Secondary hyperparathyroidism of renal origin: Secondary | ICD-10-CM | POA: Diagnosis not present

## 2016-02-11 DIAGNOSIS — N186 End stage renal disease: Secondary | ICD-10-CM | POA: Diagnosis not present

## 2016-02-11 DIAGNOSIS — D631 Anemia in chronic kidney disease: Secondary | ICD-10-CM | POA: Diagnosis not present

## 2016-02-11 DIAGNOSIS — D509 Iron deficiency anemia, unspecified: Secondary | ICD-10-CM | POA: Diagnosis not present

## 2016-02-14 DIAGNOSIS — D509 Iron deficiency anemia, unspecified: Secondary | ICD-10-CM | POA: Diagnosis not present

## 2016-02-14 DIAGNOSIS — I82409 Acute embolism and thrombosis of unspecified deep veins of unspecified lower extremity: Secondary | ICD-10-CM | POA: Diagnosis not present

## 2016-02-14 DIAGNOSIS — D631 Anemia in chronic kidney disease: Secondary | ICD-10-CM | POA: Diagnosis not present

## 2016-02-14 DIAGNOSIS — N2581 Secondary hyperparathyroidism of renal origin: Secondary | ICD-10-CM | POA: Diagnosis not present

## 2016-02-14 DIAGNOSIS — N186 End stage renal disease: Secondary | ICD-10-CM | POA: Diagnosis not present

## 2016-02-18 DIAGNOSIS — N186 End stage renal disease: Secondary | ICD-10-CM | POA: Diagnosis not present

## 2016-02-18 DIAGNOSIS — D631 Anemia in chronic kidney disease: Secondary | ICD-10-CM | POA: Diagnosis not present

## 2016-02-18 DIAGNOSIS — N2581 Secondary hyperparathyroidism of renal origin: Secondary | ICD-10-CM | POA: Diagnosis not present

## 2016-02-18 DIAGNOSIS — D509 Iron deficiency anemia, unspecified: Secondary | ICD-10-CM | POA: Diagnosis not present

## 2016-02-20 DIAGNOSIS — D631 Anemia in chronic kidney disease: Secondary | ICD-10-CM | POA: Diagnosis not present

## 2016-02-20 DIAGNOSIS — N2581 Secondary hyperparathyroidism of renal origin: Secondary | ICD-10-CM | POA: Diagnosis not present

## 2016-02-20 DIAGNOSIS — D509 Iron deficiency anemia, unspecified: Secondary | ICD-10-CM | POA: Diagnosis not present

## 2016-02-20 DIAGNOSIS — N186 End stage renal disease: Secondary | ICD-10-CM | POA: Diagnosis not present

## 2016-02-25 DIAGNOSIS — D631 Anemia in chronic kidney disease: Secondary | ICD-10-CM | POA: Diagnosis not present

## 2016-02-25 DIAGNOSIS — N2581 Secondary hyperparathyroidism of renal origin: Secondary | ICD-10-CM | POA: Diagnosis not present

## 2016-02-25 DIAGNOSIS — I82409 Acute embolism and thrombosis of unspecified deep veins of unspecified lower extremity: Secondary | ICD-10-CM | POA: Diagnosis not present

## 2016-02-25 DIAGNOSIS — N186 End stage renal disease: Secondary | ICD-10-CM | POA: Diagnosis not present

## 2016-02-28 DIAGNOSIS — D631 Anemia in chronic kidney disease: Secondary | ICD-10-CM | POA: Diagnosis not present

## 2016-02-28 DIAGNOSIS — N186 End stage renal disease: Secondary | ICD-10-CM | POA: Diagnosis not present

## 2016-02-28 DIAGNOSIS — N2581 Secondary hyperparathyroidism of renal origin: Secondary | ICD-10-CM | POA: Diagnosis not present

## 2016-03-02 DIAGNOSIS — Z992 Dependence on renal dialysis: Secondary | ICD-10-CM | POA: Diagnosis not present

## 2016-03-02 DIAGNOSIS — N186 End stage renal disease: Secondary | ICD-10-CM | POA: Diagnosis not present

## 2016-03-03 DIAGNOSIS — N2581 Secondary hyperparathyroidism of renal origin: Secondary | ICD-10-CM | POA: Diagnosis not present

## 2016-03-03 DIAGNOSIS — N186 End stage renal disease: Secondary | ICD-10-CM | POA: Diagnosis not present

## 2016-03-03 DIAGNOSIS — D631 Anemia in chronic kidney disease: Secondary | ICD-10-CM | POA: Diagnosis not present

## 2016-03-03 DIAGNOSIS — D509 Iron deficiency anemia, unspecified: Secondary | ICD-10-CM | POA: Diagnosis not present

## 2016-03-06 DIAGNOSIS — D509 Iron deficiency anemia, unspecified: Secondary | ICD-10-CM | POA: Diagnosis not present

## 2016-03-06 DIAGNOSIS — N2581 Secondary hyperparathyroidism of renal origin: Secondary | ICD-10-CM | POA: Diagnosis not present

## 2016-03-06 DIAGNOSIS — N186 End stage renal disease: Secondary | ICD-10-CM | POA: Diagnosis not present

## 2016-03-06 DIAGNOSIS — I82409 Acute embolism and thrombosis of unspecified deep veins of unspecified lower extremity: Secondary | ICD-10-CM | POA: Diagnosis not present

## 2016-03-06 DIAGNOSIS — D631 Anemia in chronic kidney disease: Secondary | ICD-10-CM | POA: Diagnosis not present

## 2016-03-08 ENCOUNTER — Ambulatory Visit (INDEPENDENT_AMBULATORY_CARE_PROVIDER_SITE_OTHER): Payer: Medicare Other | Admitting: Physician Assistant

## 2016-03-08 VITALS — BP 149/60 | HR 79 | Ht 62.0 in | Wt 96.0 lb

## 2016-03-08 DIAGNOSIS — Z85828 Personal history of other malignant neoplasm of skin: Secondary | ICD-10-CM | POA: Diagnosis not present

## 2016-03-08 DIAGNOSIS — E538 Deficiency of other specified B group vitamins: Secondary | ICD-10-CM

## 2016-03-08 DIAGNOSIS — R030 Elevated blood-pressure reading, without diagnosis of hypertension: Secondary | ICD-10-CM

## 2016-03-08 DIAGNOSIS — L821 Other seborrheic keratosis: Secondary | ICD-10-CM | POA: Diagnosis not present

## 2016-03-08 DIAGNOSIS — Z08 Encounter for follow-up examination after completed treatment for malignant neoplasm: Secondary | ICD-10-CM | POA: Diagnosis not present

## 2016-03-08 DIAGNOSIS — C44712 Basal cell carcinoma of skin of right lower limb, including hip: Secondary | ICD-10-CM | POA: Diagnosis not present

## 2016-03-08 MED ORDER — CYANOCOBALAMIN 1000 MCG/ML IJ SOLN
1000.0000 ug | Freq: Once | INTRAMUSCULAR | Status: AC
  Administered 2016-03-08: 1000 ug via INTRAMUSCULAR

## 2016-03-08 NOTE — Progress Notes (Signed)
Patient was in office for B12 injection, patient was given 1 ml in the right deltoid. the only complaint patient has was that she was having some back pain. Patient blood pressure was high on the first reading and I rechecked it after 5 minutes and it was 156/63. Patient was taking Metoprolol at one point but stated that she quit taking it.I advised the patient that I will call her and let her know what she needs to do. Please advise. Rhonda Cunningham,CMA  Recheck in 1 week nurse visit [place] Planas PA-C

## 2016-03-08 NOTE — Progress Notes (Signed)
Spoke to patient advised her to come back in next week to get blood pressure rechecked. Patient was transferred to front office to schedule. Nannie Starzyk,CMA

## 2016-03-10 DIAGNOSIS — N2581 Secondary hyperparathyroidism of renal origin: Secondary | ICD-10-CM | POA: Diagnosis not present

## 2016-03-10 DIAGNOSIS — D509 Iron deficiency anemia, unspecified: Secondary | ICD-10-CM | POA: Diagnosis not present

## 2016-03-10 DIAGNOSIS — D631 Anemia in chronic kidney disease: Secondary | ICD-10-CM | POA: Diagnosis not present

## 2016-03-10 DIAGNOSIS — N186 End stage renal disease: Secondary | ICD-10-CM | POA: Diagnosis not present

## 2016-03-13 DIAGNOSIS — N2581 Secondary hyperparathyroidism of renal origin: Secondary | ICD-10-CM | POA: Diagnosis not present

## 2016-03-13 DIAGNOSIS — I82409 Acute embolism and thrombosis of unspecified deep veins of unspecified lower extremity: Secondary | ICD-10-CM | POA: Diagnosis not present

## 2016-03-13 DIAGNOSIS — N186 End stage renal disease: Secondary | ICD-10-CM | POA: Diagnosis not present

## 2016-03-13 DIAGNOSIS — D631 Anemia in chronic kidney disease: Secondary | ICD-10-CM | POA: Diagnosis not present

## 2016-03-16 ENCOUNTER — Ambulatory Visit (INDEPENDENT_AMBULATORY_CARE_PROVIDER_SITE_OTHER): Payer: Medicare Other | Admitting: Sports Medicine

## 2016-03-16 VITALS — BP 130/71 | HR 81

## 2016-03-16 DIAGNOSIS — R030 Elevated blood-pressure reading, without diagnosis of hypertension: Secondary | ICD-10-CM | POA: Diagnosis not present

## 2016-03-16 NOTE — Progress Notes (Signed)
Pt in for a one week bp check due to it being elevated at her b12 injection nurse visit last week.  Today's reading is 130/71 pulse 81.

## 2016-03-17 DIAGNOSIS — N2581 Secondary hyperparathyroidism of renal origin: Secondary | ICD-10-CM | POA: Diagnosis not present

## 2016-03-17 DIAGNOSIS — N186 End stage renal disease: Secondary | ICD-10-CM | POA: Diagnosis not present

## 2016-03-17 DIAGNOSIS — D631 Anemia in chronic kidney disease: Secondary | ICD-10-CM | POA: Diagnosis not present

## 2016-03-20 DIAGNOSIS — D631 Anemia in chronic kidney disease: Secondary | ICD-10-CM | POA: Diagnosis not present

## 2016-03-20 DIAGNOSIS — N186 End stage renal disease: Secondary | ICD-10-CM | POA: Diagnosis not present

## 2016-03-20 DIAGNOSIS — N2581 Secondary hyperparathyroidism of renal origin: Secondary | ICD-10-CM | POA: Diagnosis not present

## 2016-03-20 DIAGNOSIS — I82409 Acute embolism and thrombosis of unspecified deep veins of unspecified lower extremity: Secondary | ICD-10-CM | POA: Diagnosis not present

## 2016-03-24 DIAGNOSIS — N2581 Secondary hyperparathyroidism of renal origin: Secondary | ICD-10-CM | POA: Diagnosis not present

## 2016-03-24 DIAGNOSIS — N186 End stage renal disease: Secondary | ICD-10-CM | POA: Diagnosis not present

## 2016-03-26 DIAGNOSIS — N186 End stage renal disease: Secondary | ICD-10-CM | POA: Diagnosis not present

## 2016-03-26 DIAGNOSIS — N2581 Secondary hyperparathyroidism of renal origin: Secondary | ICD-10-CM | POA: Diagnosis not present

## 2016-03-31 DIAGNOSIS — N186 End stage renal disease: Secondary | ICD-10-CM | POA: Diagnosis not present

## 2016-03-31 DIAGNOSIS — N2581 Secondary hyperparathyroidism of renal origin: Secondary | ICD-10-CM | POA: Diagnosis not present

## 2016-03-31 DIAGNOSIS — I82409 Acute embolism and thrombosis of unspecified deep veins of unspecified lower extremity: Secondary | ICD-10-CM | POA: Diagnosis not present

## 2016-04-02 DIAGNOSIS — N186 End stage renal disease: Secondary | ICD-10-CM | POA: Diagnosis not present

## 2016-04-02 DIAGNOSIS — Z992 Dependence on renal dialysis: Secondary | ICD-10-CM | POA: Diagnosis not present

## 2016-04-03 DIAGNOSIS — D509 Iron deficiency anemia, unspecified: Secondary | ICD-10-CM | POA: Diagnosis not present

## 2016-04-03 DIAGNOSIS — I82409 Acute embolism and thrombosis of unspecified deep veins of unspecified lower extremity: Secondary | ICD-10-CM | POA: Diagnosis not present

## 2016-04-03 DIAGNOSIS — N2581 Secondary hyperparathyroidism of renal origin: Secondary | ICD-10-CM | POA: Diagnosis not present

## 2016-04-03 DIAGNOSIS — N186 End stage renal disease: Secondary | ICD-10-CM | POA: Diagnosis not present

## 2016-04-05 ENCOUNTER — Ambulatory Visit (INDEPENDENT_AMBULATORY_CARE_PROVIDER_SITE_OTHER): Payer: Medicare Other | Admitting: Physician Assistant

## 2016-04-05 VITALS — BP 166/74 | HR 80 | Wt 101.0 lb

## 2016-04-05 DIAGNOSIS — D510 Vitamin B12 deficiency anemia due to intrinsic factor deficiency: Secondary | ICD-10-CM | POA: Diagnosis not present

## 2016-04-05 MED ORDER — CYANOCOBALAMIN 1000 MCG/ML IJ SOLN
1000.0000 ug | Freq: Once | INTRAMUSCULAR | Status: AC
  Administered 2016-04-05: 1000 ug via INTRAMUSCULAR

## 2016-04-05 NOTE — Progress Notes (Signed)
Patient presents for B12 injection. Patient tolerated the last injection well. Patient denies vision changes, headache,muscle cramps,ect. Injection given without complication.Patient states she is taking all of her medications as directed. Rechecked patient's BP second time and it was lower but still elevated.

## 2016-04-06 ENCOUNTER — Telehealth: Payer: Self-pay | Admitting: *Deleted

## 2016-04-06 DIAGNOSIS — Z7689 Persons encountering health services in other specified circumstances: Secondary | ICD-10-CM

## 2016-04-06 NOTE — Telephone Encounter (Signed)
Patient states she never received a call from Geriatric referral that was submitted back in May 2017. Placed rerferral and also gave patient the number to the office to call in case she missed their call or didn't hear back form them within the next couple of weeks.

## 2016-04-07 DIAGNOSIS — N2581 Secondary hyperparathyroidism of renal origin: Secondary | ICD-10-CM | POA: Diagnosis not present

## 2016-04-07 DIAGNOSIS — N186 End stage renal disease: Secondary | ICD-10-CM | POA: Diagnosis not present

## 2016-04-07 DIAGNOSIS — D509 Iron deficiency anemia, unspecified: Secondary | ICD-10-CM | POA: Diagnosis not present

## 2016-04-10 DIAGNOSIS — D509 Iron deficiency anemia, unspecified: Secondary | ICD-10-CM | POA: Diagnosis not present

## 2016-04-10 DIAGNOSIS — I82409 Acute embolism and thrombosis of unspecified deep veins of unspecified lower extremity: Secondary | ICD-10-CM | POA: Diagnosis not present

## 2016-04-10 DIAGNOSIS — N186 End stage renal disease: Secondary | ICD-10-CM | POA: Diagnosis not present

## 2016-04-10 DIAGNOSIS — N2581 Secondary hyperparathyroidism of renal origin: Secondary | ICD-10-CM | POA: Diagnosis not present

## 2016-04-14 DIAGNOSIS — N186 End stage renal disease: Secondary | ICD-10-CM | POA: Diagnosis not present

## 2016-04-14 DIAGNOSIS — N2581 Secondary hyperparathyroidism of renal origin: Secondary | ICD-10-CM | POA: Diagnosis not present

## 2016-04-14 DIAGNOSIS — D631 Anemia in chronic kidney disease: Secondary | ICD-10-CM | POA: Diagnosis not present

## 2016-04-17 DIAGNOSIS — N2581 Secondary hyperparathyroidism of renal origin: Secondary | ICD-10-CM | POA: Diagnosis not present

## 2016-04-17 DIAGNOSIS — Z1159 Encounter for screening for other viral diseases: Secondary | ICD-10-CM | POA: Diagnosis not present

## 2016-04-17 DIAGNOSIS — I82409 Acute embolism and thrombosis of unspecified deep veins of unspecified lower extremity: Secondary | ICD-10-CM | POA: Diagnosis not present

## 2016-04-17 DIAGNOSIS — D631 Anemia in chronic kidney disease: Secondary | ICD-10-CM | POA: Diagnosis not present

## 2016-04-17 DIAGNOSIS — N186 End stage renal disease: Secondary | ICD-10-CM | POA: Diagnosis not present

## 2016-04-21 DIAGNOSIS — D631 Anemia in chronic kidney disease: Secondary | ICD-10-CM | POA: Diagnosis not present

## 2016-04-21 DIAGNOSIS — N186 End stage renal disease: Secondary | ICD-10-CM | POA: Diagnosis not present

## 2016-04-21 DIAGNOSIS — N2581 Secondary hyperparathyroidism of renal origin: Secondary | ICD-10-CM | POA: Diagnosis not present

## 2016-04-24 DIAGNOSIS — N186 End stage renal disease: Secondary | ICD-10-CM | POA: Diagnosis not present

## 2016-04-24 DIAGNOSIS — N2581 Secondary hyperparathyroidism of renal origin: Secondary | ICD-10-CM | POA: Diagnosis not present

## 2016-04-28 DIAGNOSIS — N2581 Secondary hyperparathyroidism of renal origin: Secondary | ICD-10-CM | POA: Diagnosis not present

## 2016-04-28 DIAGNOSIS — N186 End stage renal disease: Secondary | ICD-10-CM | POA: Diagnosis not present

## 2016-05-01 DIAGNOSIS — I82409 Acute embolism and thrombosis of unspecified deep veins of unspecified lower extremity: Secondary | ICD-10-CM | POA: Diagnosis not present

## 2016-05-01 DIAGNOSIS — N2581 Secondary hyperparathyroidism of renal origin: Secondary | ICD-10-CM | POA: Diagnosis not present

## 2016-05-01 DIAGNOSIS — N186 End stage renal disease: Secondary | ICD-10-CM | POA: Diagnosis not present

## 2016-05-03 ENCOUNTER — Ambulatory Visit (INDEPENDENT_AMBULATORY_CARE_PROVIDER_SITE_OTHER): Payer: Medicare Other | Admitting: Family Medicine

## 2016-05-03 VITALS — BP 137/51 | HR 75 | Ht 60.25 in | Wt 97.9 lb

## 2016-05-03 DIAGNOSIS — D510 Vitamin B12 deficiency anemia due to intrinsic factor deficiency: Secondary | ICD-10-CM | POA: Diagnosis not present

## 2016-05-03 DIAGNOSIS — Z992 Dependence on renal dialysis: Secondary | ICD-10-CM | POA: Diagnosis not present

## 2016-05-03 DIAGNOSIS — N186 End stage renal disease: Secondary | ICD-10-CM | POA: Diagnosis not present

## 2016-05-03 MED ORDER — CYANOCOBALAMIN 1000 MCG/ML IJ SOLN
1000.0000 ug | Freq: Once | INTRAMUSCULAR | Status: AC
  Administered 2016-05-03: 1000 ug via INTRAMUSCULAR

## 2016-05-03 NOTE — Progress Notes (Signed)
Agree with above.  Gabrielle Metheney, MD  

## 2016-05-03 NOTE — Progress Notes (Signed)
   Subjective:    Patient ID: Gabrielle Stafford, female    DOB: [birth year], 83 y.o.   MRN: [id number]  HPI Patient is here for B 12 injection. Denies Gastrointestinal problems or dizziness.  Review of Systems     Objective:   Physical Exam        Assessment & Plan:  Patient tolerated injection well without complications. Pt advised to schedule next injection in 30 days.

## 2016-05-05 DIAGNOSIS — D631 Anemia in chronic kidney disease: Secondary | ICD-10-CM | POA: Diagnosis not present

## 2016-05-05 DIAGNOSIS — N186 End stage renal disease: Secondary | ICD-10-CM | POA: Diagnosis not present

## 2016-05-05 DIAGNOSIS — N2581 Secondary hyperparathyroidism of renal origin: Secondary | ICD-10-CM | POA: Diagnosis not present

## 2016-05-05 DIAGNOSIS — D509 Iron deficiency anemia, unspecified: Secondary | ICD-10-CM | POA: Diagnosis not present

## 2016-05-08 DIAGNOSIS — N2581 Secondary hyperparathyroidism of renal origin: Secondary | ICD-10-CM | POA: Diagnosis not present

## 2016-05-08 DIAGNOSIS — N186 End stage renal disease: Secondary | ICD-10-CM | POA: Diagnosis not present

## 2016-05-08 DIAGNOSIS — D509 Iron deficiency anemia, unspecified: Secondary | ICD-10-CM | POA: Diagnosis not present

## 2016-05-08 DIAGNOSIS — D631 Anemia in chronic kidney disease: Secondary | ICD-10-CM | POA: Diagnosis not present

## 2016-05-08 DIAGNOSIS — I82409 Acute embolism and thrombosis of unspecified deep veins of unspecified lower extremity: Secondary | ICD-10-CM | POA: Diagnosis not present

## 2016-05-12 DIAGNOSIS — N2581 Secondary hyperparathyroidism of renal origin: Secondary | ICD-10-CM | POA: Diagnosis not present

## 2016-05-12 DIAGNOSIS — D509 Iron deficiency anemia, unspecified: Secondary | ICD-10-CM | POA: Diagnosis not present

## 2016-05-12 DIAGNOSIS — D631 Anemia in chronic kidney disease: Secondary | ICD-10-CM | POA: Diagnosis not present

## 2016-05-12 DIAGNOSIS — N186 End stage renal disease: Secondary | ICD-10-CM | POA: Diagnosis not present

## 2016-05-15 DIAGNOSIS — N2581 Secondary hyperparathyroidism of renal origin: Secondary | ICD-10-CM | POA: Diagnosis not present

## 2016-05-15 DIAGNOSIS — D631 Anemia in chronic kidney disease: Secondary | ICD-10-CM | POA: Diagnosis not present

## 2016-05-15 DIAGNOSIS — N186 End stage renal disease: Secondary | ICD-10-CM | POA: Diagnosis not present

## 2016-05-19 DIAGNOSIS — N2581 Secondary hyperparathyroidism of renal origin: Secondary | ICD-10-CM | POA: Diagnosis not present

## 2016-05-19 DIAGNOSIS — N186 End stage renal disease: Secondary | ICD-10-CM | POA: Diagnosis not present

## 2016-05-19 DIAGNOSIS — D631 Anemia in chronic kidney disease: Secondary | ICD-10-CM | POA: Diagnosis not present

## 2016-05-22 DIAGNOSIS — D631 Anemia in chronic kidney disease: Secondary | ICD-10-CM | POA: Diagnosis not present

## 2016-05-22 DIAGNOSIS — N186 End stage renal disease: Secondary | ICD-10-CM | POA: Diagnosis not present

## 2016-05-22 DIAGNOSIS — N2581 Secondary hyperparathyroidism of renal origin: Secondary | ICD-10-CM | POA: Diagnosis not present

## 2016-05-22 DIAGNOSIS — I82409 Acute embolism and thrombosis of unspecified deep veins of unspecified lower extremity: Secondary | ICD-10-CM | POA: Diagnosis not present

## 2016-05-26 DIAGNOSIS — N186 End stage renal disease: Secondary | ICD-10-CM | POA: Diagnosis not present

## 2016-05-26 DIAGNOSIS — N2581 Secondary hyperparathyroidism of renal origin: Secondary | ICD-10-CM | POA: Diagnosis not present

## 2016-05-29 ENCOUNTER — Ambulatory Visit: Payer: Medicare Other

## 2016-05-29 DIAGNOSIS — I82409 Acute embolism and thrombosis of unspecified deep veins of unspecified lower extremity: Secondary | ICD-10-CM | POA: Diagnosis not present

## 2016-05-29 DIAGNOSIS — N2581 Secondary hyperparathyroidism of renal origin: Secondary | ICD-10-CM | POA: Diagnosis not present

## 2016-05-29 DIAGNOSIS — N186 End stage renal disease: Secondary | ICD-10-CM | POA: Diagnosis not present

## 2016-05-31 ENCOUNTER — Ambulatory Visit (INDEPENDENT_AMBULATORY_CARE_PROVIDER_SITE_OTHER): Payer: Medicare Other | Admitting: Family Medicine

## 2016-05-31 VITALS — BP 146/54 | HR 70 | Temp 98.0°F | Resp 16 | Wt 98.0 lb

## 2016-05-31 DIAGNOSIS — D619 Aplastic anemia, unspecified: Secondary | ICD-10-CM | POA: Diagnosis not present

## 2016-05-31 DIAGNOSIS — N186 End stage renal disease: Secondary | ICD-10-CM | POA: Diagnosis not present

## 2016-05-31 DIAGNOSIS — Z992 Dependence on renal dialysis: Secondary | ICD-10-CM | POA: Diagnosis not present

## 2016-05-31 MED ORDER — CYANOCOBALAMIN 1000 MCG/ML IJ SOLN
1000.0000 ug | Freq: Once | INTRAMUSCULAR | Status: AC
  Administered 2016-05-31: 1000 ug via INTRAMUSCULAR

## 2016-05-31 NOTE — Progress Notes (Signed)
   Subjective:    Patient ID: Gabrielle Stafford, female    DOB: May 10, 1929, 83 y.o.   MRN: [id number]  HPI Patient is here for a vitamin B12 injection. Denies gastrointestinal problems or dizziness related to this medication.    Review of Systems     Objective:   Physical Exam        Assessment & Plan:  Patient tolerated injection well without complications. Patient advised to schedule next injection in 30 days.

## 2016-06-02 DIAGNOSIS — N186 End stage renal disease: Secondary | ICD-10-CM | POA: Diagnosis not present

## 2016-06-02 DIAGNOSIS — D509 Iron deficiency anemia, unspecified: Secondary | ICD-10-CM | POA: Diagnosis not present

## 2016-06-02 DIAGNOSIS — N2581 Secondary hyperparathyroidism of renal origin: Secondary | ICD-10-CM | POA: Diagnosis not present

## 2016-06-05 DIAGNOSIS — N2581 Secondary hyperparathyroidism of renal origin: Secondary | ICD-10-CM | POA: Diagnosis not present

## 2016-06-05 DIAGNOSIS — I82409 Acute embolism and thrombosis of unspecified deep veins of unspecified lower extremity: Secondary | ICD-10-CM | POA: Diagnosis not present

## 2016-06-05 DIAGNOSIS — D509 Iron deficiency anemia, unspecified: Secondary | ICD-10-CM | POA: Diagnosis not present

## 2016-06-05 DIAGNOSIS — N186 End stage renal disease: Secondary | ICD-10-CM | POA: Diagnosis not present

## 2016-06-09 DIAGNOSIS — N2581 Secondary hyperparathyroidism of renal origin: Secondary | ICD-10-CM | POA: Diagnosis not present

## 2016-06-09 DIAGNOSIS — D509 Iron deficiency anemia, unspecified: Secondary | ICD-10-CM | POA: Diagnosis not present

## 2016-06-09 DIAGNOSIS — N186 End stage renal disease: Secondary | ICD-10-CM | POA: Diagnosis not present

## 2016-06-12 DIAGNOSIS — D631 Anemia in chronic kidney disease: Secondary | ICD-10-CM | POA: Diagnosis not present

## 2016-06-12 DIAGNOSIS — N186 End stage renal disease: Secondary | ICD-10-CM | POA: Diagnosis not present

## 2016-06-12 DIAGNOSIS — I82409 Acute embolism and thrombosis of unspecified deep veins of unspecified lower extremity: Secondary | ICD-10-CM | POA: Diagnosis not present

## 2016-06-12 DIAGNOSIS — N2581 Secondary hyperparathyroidism of renal origin: Secondary | ICD-10-CM | POA: Diagnosis not present

## 2016-06-16 DIAGNOSIS — N2581 Secondary hyperparathyroidism of renal origin: Secondary | ICD-10-CM | POA: Diagnosis not present

## 2016-06-16 DIAGNOSIS — D631 Anemia in chronic kidney disease: Secondary | ICD-10-CM | POA: Diagnosis not present

## 2016-06-16 DIAGNOSIS — N186 End stage renal disease: Secondary | ICD-10-CM | POA: Diagnosis not present

## 2016-06-19 DIAGNOSIS — N186 End stage renal disease: Secondary | ICD-10-CM | POA: Diagnosis not present

## 2016-06-19 DIAGNOSIS — I82409 Acute embolism and thrombosis of unspecified deep veins of unspecified lower extremity: Secondary | ICD-10-CM | POA: Diagnosis not present

## 2016-06-19 DIAGNOSIS — D631 Anemia in chronic kidney disease: Secondary | ICD-10-CM | POA: Diagnosis not present

## 2016-06-19 DIAGNOSIS — N2581 Secondary hyperparathyroidism of renal origin: Secondary | ICD-10-CM | POA: Diagnosis not present

## 2016-06-23 DIAGNOSIS — N2581 Secondary hyperparathyroidism of renal origin: Secondary | ICD-10-CM | POA: Diagnosis not present

## 2016-06-23 DIAGNOSIS — N186 End stage renal disease: Secondary | ICD-10-CM | POA: Diagnosis not present

## 2016-06-23 DIAGNOSIS — D631 Anemia in chronic kidney disease: Secondary | ICD-10-CM | POA: Diagnosis not present

## 2016-06-26 ENCOUNTER — Other Ambulatory Visit: Payer: Self-pay | Admitting: Family Medicine

## 2016-06-26 DIAGNOSIS — N2581 Secondary hyperparathyroidism of renal origin: Secondary | ICD-10-CM | POA: Diagnosis not present

## 2016-06-26 DIAGNOSIS — D631 Anemia in chronic kidney disease: Secondary | ICD-10-CM | POA: Diagnosis not present

## 2016-06-26 DIAGNOSIS — N186 End stage renal disease: Secondary | ICD-10-CM | POA: Diagnosis not present

## 2016-06-26 DIAGNOSIS — I82409 Acute embolism and thrombosis of unspecified deep veins of unspecified lower extremity: Secondary | ICD-10-CM | POA: Diagnosis not present

## 2016-06-28 ENCOUNTER — Ambulatory Visit: Payer: Medicare Other

## 2016-06-28 ENCOUNTER — Ambulatory Visit (INDEPENDENT_AMBULATORY_CARE_PROVIDER_SITE_OTHER): Payer: Medicare Other | Admitting: Physician Assistant

## 2016-06-28 VITALS — BP 125/53 | HR 83 | Wt 98.0 lb

## 2016-06-28 DIAGNOSIS — D510 Vitamin B12 deficiency anemia due to intrinsic factor deficiency: Secondary | ICD-10-CM

## 2016-06-28 DIAGNOSIS — M810 Age-related osteoporosis without current pathological fracture: Secondary | ICD-10-CM

## 2016-06-28 MED ORDER — CYANOCOBALAMIN 1000 MCG/ML IJ SOLN
1000.0000 ug | Freq: Once | INTRAMUSCULAR | Status: AC
  Administered 2016-06-28: 1000 ug via INTRAMUSCULAR

## 2016-06-28 NOTE — Progress Notes (Signed)
Patient is here for a vitamin B 12 injection. Denies muscle cramps, weakness or irregular heart rate. Patient tolerated injection in left deltoid well without complications. Patient advised to schedule next injection 30 days from today. While in clinic today Patient reports she would like to have a bone density scan completed. Reports she has had to cut back on her calcium due to dialysis and the diet they have her on, and she fell a couple weeks ago. Pt currently has osteoporosis listed on problem list but Pt reports she has never had a bone scan before. Will place order under PCP name for results to come back to correct Provider. Ultimately, based on results, she would like to be able to get back to the gym for a senior citizen work out program.

## 2016-06-30 DIAGNOSIS — N2581 Secondary hyperparathyroidism of renal origin: Secondary | ICD-10-CM | POA: Diagnosis not present

## 2016-06-30 DIAGNOSIS — D631 Anemia in chronic kidney disease: Secondary | ICD-10-CM | POA: Diagnosis not present

## 2016-06-30 DIAGNOSIS — N186 End stage renal disease: Secondary | ICD-10-CM | POA: Diagnosis not present

## 2016-07-01 DIAGNOSIS — Z992 Dependence on renal dialysis: Secondary | ICD-10-CM | POA: Diagnosis not present

## 2016-07-01 DIAGNOSIS — N186 End stage renal disease: Secondary | ICD-10-CM | POA: Diagnosis not present

## 2016-07-04 ENCOUNTER — Encounter: Payer: Self-pay | Admitting: *Deleted

## 2016-07-04 ENCOUNTER — Emergency Department
Admission: EM | Admit: 2016-07-04 | Discharge: 2016-07-04 | Disposition: A | Discharge status: Home / Self Care | Payer: Medicare Other | Source: Home / Self Care | Attending: Family Medicine | Admitting: Family Medicine

## 2016-07-04 DIAGNOSIS — J029 Acute pharyngitis, unspecified: Secondary | ICD-10-CM

## 2016-07-04 LAB — POCT RAPID STREP A (OFFICE): Rapid Strep A Screen: NEGATIVE

## 2016-07-04 NOTE — ED Triage Notes (Signed)
Patient awoke with sore throat this AM. No other associated symptoms or fever.

## 2016-07-04 NOTE — ED Provider Notes (Signed)
CSN: [id number]     Arrival date & time 07/04/16  1109 History   First MD Initiated Contact with Patient 07/04/16 1153     Chief Complaint  Patient presents with  . Sore Throat   (Consider location/radiation/quality/duration/timing/severity/associated sxs/prior Treatment) HPI  Gabrielle Stafford is a 83 y.o. female presenting to UC with c/o sore throat that started this morning when she woke up. Pain is worse with swallowing.  Denies other symptoms of headache, ear pain, congestion, cough, fever, vomiting or diarrhea. She notes she goes to dialysis twice a week. She thinks last week is when she got sick as another patient she shares the bus with kept coughing and clearing her throat on the bus, then had a conversation with pt while at dialysis.  Pt notes she tends to get sick easy due to hx of colon cancer. She notes her immune system is not very strong.    Past Medical History:  Diagnosis Date  . Arthritis   . Cancer ([place])   . Cardiac arrest ([place])   . DVT (deep venous thrombosis) ([place])   . Hip fracture ([facility])    Left  . Hypokalemia   . PE (pulmonary thromboembolism) ([place])   . Short gut syndrome   . Stented coronary artery    Past Surgical History:  Procedure Laterality Date  . CATARACT EXTRACTION, BILATERAL  1999  . CHOLECYSTECTOMY  1999  . COLON SURGERY  1999   colon cancer   Family History  Problem Relation Age of Onset  . Alcohol abuse Father   . Gout Father   . Heart attack Father     brothers  . Diabetes Father   . Hypertension Father   . Cancer Father     bladder tumor  . Colon cancer Mother     colon  . Pulmonary embolism Mother     leg amputation  . Depression Mother   . Hyperlipidemia Mother   . Diabetes Sister   . Breast cancer Sister   . Arthritis      aunt   Social History  Substance Use Topics  . Smoking status: Former Smoker    Types: Cigarettes    Quit date: 04/03/1997  . Smokeless tobacco: Never Used  . Alcohol use No   OB History    No data  available     Review of Systems  Constitutional: Negative for chills and fever.  HENT: Positive for sore throat. Negative for congestion, ear pain, trouble swallowing and voice change.   Respiratory: Negative for cough and shortness of breath.   Cardiovascular: Negative for chest pain and palpitations.  Gastrointestinal: Negative for abdominal pain, diarrhea, nausea and vomiting.  Musculoskeletal: Negative for arthralgias, back pain and myalgias.  Skin: Negative for rash.    Allergies  Metoprolol; Furosemide; Alendronate sodium; Amoxicillin; Azithromycin; Ciprofloxacin; Diltiazem hcl er; Nitrofurantoin; Sulfamethoxazole-trimethoprim; Tramadol hcl; and Keflex [cephalexin]  Home Medications   Prior to Admission medications   Medication Sig Start Date End Date Taking? Authorizing Provider  Acetaminophen 650 MG TABS Take 1 tablet by mouth as needed.    Historical Provider, MD  calcium carbonate (TUMS - DOSED IN MG ELEMENTAL CALCIUM) 500 MG chewable tablet Chew 2-3 tablets by mouth 3 (three) times daily after meals.     Historical Provider, MD  cholecalciferol (VITAMIN D) 1000 UNITS tablet Take 1,000 Units by mouth.    Historical Provider, MD  cyanocobalamin (,VITAMIN B-12,) 1000 MCG/ML injection Inject 1,000 mcg into the muscle every 30 (thirty) days.  Historical Provider, MD  folic acid-vitamin b complex-vitamin c-selenium-zinc (DIALYVITE) 3 MG TABS tablet Take 1 tablet by mouth.    Historical Provider, MD  magnesium gluconate (MAGONATE) 500 MG tablet Take 250 mg by mouth 2 (two) times daily. 03/11/12   Jade L Breeback, PA-C  warfarin (COUMADIN) 2 MG tablet TAKE 1 TABLET DAILY. Patient taking differently: TAKE 5mg   TABLET DAILY. 04/09/13   Hali Marry, MD   Meds Ordered and Administered this Visit  Medications - No data to display  BP 126/60 (BP Location: Left Arm)   Pulse 80   Temp 98.1 F (36.7 C) (Oral)   Wt 98 lb (44.5 kg)   SpO2 94%   BMI 18.98 kg/m  No data  found.   Physical Exam  Constitutional: She is oriented to person, place, and time. She appears well-developed and well-nourished. No distress.  HENT:  Head: Normocephalic and atraumatic.  Right Ear: Tympanic membrane normal.  Left Ear: Tympanic membrane normal.  Nose: Nose normal.  Mouth/Throat: Uvula is midline and mucous membranes are normal. Posterior oropharyngeal erythema present. No oropharyngeal exudate, posterior oropharyngeal edema or tonsillar abscesses.  Eyes: EOM are normal.  Neck: Normal range of motion. Neck supple.  Cardiovascular: Normal rate and regular rhythm.   Pulmonary/Chest: Effort normal and breath sounds normal. No stridor. No respiratory distress. She has no wheezes. She has no rales.  Musculoskeletal: Normal range of motion.  Lymphadenopathy:    She has no cervical adenopathy.  Neurological: She is alert and oriented to person, place, and time.  Skin: Skin is warm and dry. She is not diaphoretic.  Psychiatric: She has a normal mood and affect. Her behavior is normal.  Nursing note and vitals reviewed.   Urgent Care Course     Procedures (including critical care time)  Labs Review Labs Reviewed  STREP A DNA PROBE  POCT RAPID STREP A (OFFICE)    Imaging Review No results found.    MDM   1. Viral pharyngitis    Rapid strep: Negative Culture sent  Symptoms likely viral. Encouraged acetaminophen and warm saltwater garles to help with throat pain. f/u with PCP in 1 week if not improving.     Noland Fordyce, PA-C 07/04/16 1300

## 2016-07-05 ENCOUNTER — Telehealth: Payer: Self-pay | Admitting: Emergency Medicine

## 2016-07-05 DIAGNOSIS — D631 Anemia in chronic kidney disease: Secondary | ICD-10-CM | POA: Diagnosis not present

## 2016-07-05 DIAGNOSIS — I82409 Acute embolism and thrombosis of unspecified deep veins of unspecified lower extremity: Secondary | ICD-10-CM | POA: Diagnosis not present

## 2016-07-05 DIAGNOSIS — D509 Iron deficiency anemia, unspecified: Secondary | ICD-10-CM | POA: Diagnosis not present

## 2016-07-05 DIAGNOSIS — T8249XA Other complication of vascular dialysis catheter, initial encounter: Secondary | ICD-10-CM | POA: Diagnosis not present

## 2016-07-05 DIAGNOSIS — N186 End stage renal disease: Secondary | ICD-10-CM | POA: Diagnosis not present

## 2016-07-05 DIAGNOSIS — N2581 Secondary hyperparathyroidism of renal origin: Secondary | ICD-10-CM | POA: Diagnosis not present

## 2016-07-05 LAB — STREP A DNA PROBE: GASP: NOT DETECTED

## 2016-07-05 NOTE — Telephone Encounter (Signed)
Spoke with son, she is feeling better, strep is neg.

## 2016-07-07 DIAGNOSIS — D509 Iron deficiency anemia, unspecified: Secondary | ICD-10-CM | POA: Diagnosis not present

## 2016-07-07 DIAGNOSIS — N186 End stage renal disease: Secondary | ICD-10-CM | POA: Diagnosis not present

## 2016-07-07 DIAGNOSIS — T8249XA Other complication of vascular dialysis catheter, initial encounter: Secondary | ICD-10-CM | POA: Diagnosis not present

## 2016-07-07 DIAGNOSIS — D631 Anemia in chronic kidney disease: Secondary | ICD-10-CM | POA: Diagnosis not present

## 2016-07-07 DIAGNOSIS — N2581 Secondary hyperparathyroidism of renal origin: Secondary | ICD-10-CM | POA: Diagnosis not present

## 2016-07-10 DIAGNOSIS — N2581 Secondary hyperparathyroidism of renal origin: Secondary | ICD-10-CM | POA: Diagnosis not present

## 2016-07-10 DIAGNOSIS — I82409 Acute embolism and thrombosis of unspecified deep veins of unspecified lower extremity: Secondary | ICD-10-CM | POA: Diagnosis not present

## 2016-07-10 DIAGNOSIS — D631 Anemia in chronic kidney disease: Secondary | ICD-10-CM | POA: Diagnosis not present

## 2016-07-10 DIAGNOSIS — N186 End stage renal disease: Secondary | ICD-10-CM | POA: Diagnosis not present

## 2016-07-10 DIAGNOSIS — D509 Iron deficiency anemia, unspecified: Secondary | ICD-10-CM | POA: Diagnosis not present

## 2016-07-10 DIAGNOSIS — T8249XA Other complication of vascular dialysis catheter, initial encounter: Secondary | ICD-10-CM | POA: Diagnosis not present

## 2016-07-11 ENCOUNTER — Ambulatory Visit (INDEPENDENT_AMBULATORY_CARE_PROVIDER_SITE_OTHER): Payer: Medicare Other

## 2016-07-11 DIAGNOSIS — M810 Age-related osteoporosis without current pathological fracture: Secondary | ICD-10-CM | POA: Diagnosis not present

## 2016-07-14 DIAGNOSIS — N186 End stage renal disease: Secondary | ICD-10-CM | POA: Diagnosis not present

## 2016-07-14 DIAGNOSIS — N2581 Secondary hyperparathyroidism of renal origin: Secondary | ICD-10-CM | POA: Diagnosis not present

## 2016-07-14 DIAGNOSIS — D509 Iron deficiency anemia, unspecified: Secondary | ICD-10-CM | POA: Diagnosis not present

## 2016-07-17 DIAGNOSIS — D509 Iron deficiency anemia, unspecified: Secondary | ICD-10-CM | POA: Diagnosis not present

## 2016-07-17 DIAGNOSIS — N186 End stage renal disease: Secondary | ICD-10-CM | POA: Diagnosis not present

## 2016-07-17 DIAGNOSIS — N2581 Secondary hyperparathyroidism of renal origin: Secondary | ICD-10-CM | POA: Diagnosis not present

## 2016-07-17 DIAGNOSIS — I82409 Acute embolism and thrombosis of unspecified deep veins of unspecified lower extremity: Secondary | ICD-10-CM | POA: Diagnosis not present

## 2016-07-21 DIAGNOSIS — N186 End stage renal disease: Secondary | ICD-10-CM | POA: Diagnosis not present

## 2016-07-21 DIAGNOSIS — D509 Iron deficiency anemia, unspecified: Secondary | ICD-10-CM | POA: Diagnosis not present

## 2016-07-21 DIAGNOSIS — N2581 Secondary hyperparathyroidism of renal origin: Secondary | ICD-10-CM | POA: Diagnosis not present

## 2016-07-24 DIAGNOSIS — N186 End stage renal disease: Secondary | ICD-10-CM | POA: Diagnosis not present

## 2016-07-24 DIAGNOSIS — Z23 Encounter for immunization: Secondary | ICD-10-CM | POA: Diagnosis not present

## 2016-07-24 DIAGNOSIS — N2581 Secondary hyperparathyroidism of renal origin: Secondary | ICD-10-CM | POA: Diagnosis not present

## 2016-07-24 DIAGNOSIS — I82409 Acute embolism and thrombosis of unspecified deep veins of unspecified lower extremity: Secondary | ICD-10-CM | POA: Diagnosis not present

## 2016-07-26 ENCOUNTER — Ambulatory Visit (INDEPENDENT_AMBULATORY_CARE_PROVIDER_SITE_OTHER): Payer: Medicare Other | Admitting: Family Medicine

## 2016-07-26 VITALS — BP 96/44 | HR 87 | Wt 93.0 lb

## 2016-07-26 DIAGNOSIS — D510 Vitamin B12 deficiency anemia due to intrinsic factor deficiency: Secondary | ICD-10-CM

## 2016-07-26 MED ORDER — CYANOCOBALAMIN 1000 MCG/ML IJ SOLN
1000.0000 ug | Freq: Once | INTRAMUSCULAR | Status: AC
  Administered 2016-07-26: 1000 ug via INTRAMUSCULAR

## 2016-07-26 NOTE — Progress Notes (Signed)
   Subjective:    Patient ID: Gabrielle Stafford, female    DOB: Nov 14, 1929, 83 y.o.   MRN: [id number]  Gabrielle Stafford is here for vitamin B 12 injection. Denies in GI problems or elevated heart.       Review of Systems     Objective:   Physical Exam        Assessment & Plan:  Patient tolerated injection well without complications. Patient advised to schedule next injection 30 days from today.

## 2016-07-26 NOTE — Progress Notes (Signed)
Bp was low today. Encouraged her to contact her nephrologist and let them know. Normally her blood pressures in the 120s when she comes in. She's also lost about 5 pounds but she also reports that she's been dealing with a dental infection recently. Says suspect she probably had low by mouth intake.  Gabrielle Lecher, MD

## 2016-07-28 DIAGNOSIS — Z23 Encounter for immunization: Secondary | ICD-10-CM | POA: Diagnosis not present

## 2016-07-28 DIAGNOSIS — N186 End stage renal disease: Secondary | ICD-10-CM | POA: Diagnosis not present

## 2016-07-28 DIAGNOSIS — N2581 Secondary hyperparathyroidism of renal origin: Secondary | ICD-10-CM | POA: Diagnosis not present

## 2016-07-31 DIAGNOSIS — Z23 Encounter for immunization: Secondary | ICD-10-CM | POA: Diagnosis not present

## 2016-07-31 DIAGNOSIS — N2581 Secondary hyperparathyroidism of renal origin: Secondary | ICD-10-CM | POA: Diagnosis not present

## 2016-07-31 DIAGNOSIS — Z992 Dependence on renal dialysis: Secondary | ICD-10-CM | POA: Diagnosis not present

## 2016-07-31 DIAGNOSIS — N186 End stage renal disease: Secondary | ICD-10-CM | POA: Diagnosis not present

## 2016-07-31 DIAGNOSIS — I82409 Acute embolism and thrombosis of unspecified deep veins of unspecified lower extremity: Secondary | ICD-10-CM | POA: Diagnosis not present

## 2016-08-04 DIAGNOSIS — D631 Anemia in chronic kidney disease: Secondary | ICD-10-CM | POA: Diagnosis not present

## 2016-08-04 DIAGNOSIS — N186 End stage renal disease: Secondary | ICD-10-CM | POA: Diagnosis not present

## 2016-08-04 DIAGNOSIS — D509 Iron deficiency anemia, unspecified: Secondary | ICD-10-CM | POA: Diagnosis not present

## 2016-08-04 DIAGNOSIS — N2581 Secondary hyperparathyroidism of renal origin: Secondary | ICD-10-CM | POA: Diagnosis not present

## 2016-08-07 DIAGNOSIS — D509 Iron deficiency anemia, unspecified: Secondary | ICD-10-CM | POA: Diagnosis not present

## 2016-08-07 DIAGNOSIS — D631 Anemia in chronic kidney disease: Secondary | ICD-10-CM | POA: Diagnosis not present

## 2016-08-07 DIAGNOSIS — N2581 Secondary hyperparathyroidism of renal origin: Secondary | ICD-10-CM | POA: Diagnosis not present

## 2016-08-07 DIAGNOSIS — N186 End stage renal disease: Secondary | ICD-10-CM | POA: Diagnosis not present

## 2016-08-07 DIAGNOSIS — I82409 Acute embolism and thrombosis of unspecified deep veins of unspecified lower extremity: Secondary | ICD-10-CM | POA: Diagnosis not present

## 2016-08-11 DIAGNOSIS — N2581 Secondary hyperparathyroidism of renal origin: Secondary | ICD-10-CM | POA: Diagnosis not present

## 2016-08-11 DIAGNOSIS — N186 End stage renal disease: Secondary | ICD-10-CM | POA: Diagnosis not present

## 2016-08-16 DIAGNOSIS — J449 Chronic obstructive pulmonary disease, unspecified: Secondary | ICD-10-CM | POA: Diagnosis not present

## 2016-08-16 DIAGNOSIS — E785 Hyperlipidemia, unspecified: Secondary | ICD-10-CM | POA: Diagnosis not present

## 2016-08-16 DIAGNOSIS — Z85038 Personal history of other malignant neoplasm of large intestine: Secondary | ICD-10-CM | POA: Diagnosis not present

## 2016-08-16 DIAGNOSIS — Z86718 Personal history of other venous thrombosis and embolism: Secondary | ICD-10-CM | POA: Diagnosis not present

## 2016-08-16 DIAGNOSIS — Z992 Dependence on renal dialysis: Secondary | ICD-10-CM | POA: Diagnosis not present

## 2016-08-16 DIAGNOSIS — Z452 Encounter for adjustment and management of vascular access device: Secondary | ICD-10-CM | POA: Diagnosis not present

## 2016-08-16 DIAGNOSIS — R918 Other nonspecific abnormal finding of lung field: Secondary | ICD-10-CM | POA: Diagnosis not present

## 2016-08-16 DIAGNOSIS — C189 Malignant neoplasm of colon, unspecified: Secondary | ICD-10-CM | POA: Diagnosis not present

## 2016-08-16 DIAGNOSIS — E869 Volume depletion, unspecified: Secondary | ICD-10-CM | POA: Diagnosis not present

## 2016-08-16 DIAGNOSIS — E43 Unspecified severe protein-calorie malnutrition: Secondary | ICD-10-CM | POA: Diagnosis not present

## 2016-08-16 DIAGNOSIS — E876 Hypokalemia: Secondary | ICD-10-CM | POA: Diagnosis not present

## 2016-08-16 DIAGNOSIS — K529 Noninfective gastroenteritis and colitis, unspecified: Secondary | ICD-10-CM | POA: Diagnosis not present

## 2016-08-16 DIAGNOSIS — R079 Chest pain, unspecified: Secondary | ICD-10-CM | POA: Diagnosis not present

## 2016-08-16 DIAGNOSIS — N186 End stage renal disease: Secondary | ICD-10-CM | POA: Diagnosis not present

## 2016-08-16 DIAGNOSIS — Z87891 Personal history of nicotine dependence: Secondary | ICD-10-CM | POA: Diagnosis not present

## 2016-08-16 DIAGNOSIS — Z86010 Personal history of colonic polyps: Secondary | ICD-10-CM | POA: Diagnosis not present

## 2016-08-16 DIAGNOSIS — R531 Weakness: Secondary | ICD-10-CM | POA: Diagnosis not present

## 2016-08-16 DIAGNOSIS — M40204 Unspecified kyphosis, thoracic region: Secondary | ICD-10-CM | POA: Diagnosis not present

## 2016-08-16 DIAGNOSIS — Z9115 Patient's noncompliance with renal dialysis: Secondary | ICD-10-CM | POA: Diagnosis not present

## 2016-08-16 DIAGNOSIS — R0602 Shortness of breath: Secondary | ICD-10-CM | POA: Diagnosis not present

## 2016-08-16 DIAGNOSIS — J439 Emphysema, unspecified: Secondary | ICD-10-CM | POA: Diagnosis not present

## 2016-08-16 DIAGNOSIS — E872 Acidosis: Secondary | ICD-10-CM | POA: Diagnosis not present

## 2016-08-16 DIAGNOSIS — M810 Age-related osteoporosis without current pathological fracture: Secondary | ICD-10-CM | POA: Diagnosis not present

## 2016-08-16 DIAGNOSIS — D689 Coagulation defect, unspecified: Secondary | ICD-10-CM | POA: Diagnosis not present

## 2016-08-23 ENCOUNTER — Ambulatory Visit (INDEPENDENT_AMBULATORY_CARE_PROVIDER_SITE_OTHER): Payer: Medicare Other | Admitting: Physician Assistant

## 2016-08-23 ENCOUNTER — Ambulatory Visit: Payer: Medicare Other

## 2016-08-23 VITALS — BP 113/60 | HR 91 | Wt 95.0 lb

## 2016-08-23 DIAGNOSIS — D510 Vitamin B12 deficiency anemia due to intrinsic factor deficiency: Secondary | ICD-10-CM

## 2016-08-23 MED ORDER — CYANOCOBALAMIN 1000 MCG/ML IJ SOLN
1000.0000 ug | Freq: Once | INTRAMUSCULAR | Status: AC
  Administered 2016-08-23: 1000 ug via INTRAMUSCULAR

## 2016-08-23 NOTE — Progress Notes (Signed)
Patient is here for a vitamin B 12 injection. Denies muscle cramps, weakness or irregular heart rate. Patient tolerated injection in left deltoid well without complications. Patient advised to schedule next injection 30 days from today. While in clinic today Patient reports she was recently admittied to [facility] for weakness and dehydration. Will schedule a hospital follow up PCP.  Agree with above plan. Iran Planas PA-C

## 2016-08-25 DIAGNOSIS — I82409 Acute embolism and thrombosis of unspecified deep veins of unspecified lower extremity: Secondary | ICD-10-CM | POA: Diagnosis not present

## 2016-08-25 DIAGNOSIS — D631 Anemia in chronic kidney disease: Secondary | ICD-10-CM | POA: Diagnosis not present

## 2016-08-25 DIAGNOSIS — N186 End stage renal disease: Secondary | ICD-10-CM | POA: Diagnosis not present

## 2016-08-25 DIAGNOSIS — N2581 Secondary hyperparathyroidism of renal origin: Secondary | ICD-10-CM | POA: Diagnosis not present

## 2016-08-28 DIAGNOSIS — D631 Anemia in chronic kidney disease: Secondary | ICD-10-CM | POA: Diagnosis not present

## 2016-08-28 DIAGNOSIS — N2581 Secondary hyperparathyroidism of renal origin: Secondary | ICD-10-CM | POA: Diagnosis not present

## 2016-08-28 DIAGNOSIS — I82409 Acute embolism and thrombosis of unspecified deep veins of unspecified lower extremity: Secondary | ICD-10-CM | POA: Diagnosis not present

## 2016-08-28 DIAGNOSIS — N186 End stage renal disease: Secondary | ICD-10-CM | POA: Diagnosis not present

## 2016-08-31 ENCOUNTER — Inpatient Hospital Stay: Payer: Medicare Other | Admitting: Family Medicine

## 2016-08-31 DIAGNOSIS — Z992 Dependence on renal dialysis: Secondary | ICD-10-CM | POA: Diagnosis not present

## 2016-08-31 DIAGNOSIS — N186 End stage renal disease: Secondary | ICD-10-CM | POA: Diagnosis not present

## 2016-09-01 DIAGNOSIS — N2581 Secondary hyperparathyroidism of renal origin: Secondary | ICD-10-CM | POA: Diagnosis not present

## 2016-09-01 DIAGNOSIS — I82409 Acute embolism and thrombosis of unspecified deep veins of unspecified lower extremity: Secondary | ICD-10-CM | POA: Diagnosis not present

## 2016-09-01 DIAGNOSIS — D631 Anemia in chronic kidney disease: Secondary | ICD-10-CM | POA: Diagnosis not present

## 2016-09-01 DIAGNOSIS — D509 Iron deficiency anemia, unspecified: Secondary | ICD-10-CM | POA: Diagnosis not present

## 2016-09-01 DIAGNOSIS — N186 End stage renal disease: Secondary | ICD-10-CM | POA: Diagnosis not present

## 2016-09-04 DIAGNOSIS — N2581 Secondary hyperparathyroidism of renal origin: Secondary | ICD-10-CM | POA: Diagnosis not present

## 2016-09-04 DIAGNOSIS — D509 Iron deficiency anemia, unspecified: Secondary | ICD-10-CM | POA: Diagnosis not present

## 2016-09-04 DIAGNOSIS — D631 Anemia in chronic kidney disease: Secondary | ICD-10-CM | POA: Diagnosis not present

## 2016-09-04 DIAGNOSIS — N186 End stage renal disease: Secondary | ICD-10-CM | POA: Diagnosis not present

## 2016-09-08 DIAGNOSIS — D509 Iron deficiency anemia, unspecified: Secondary | ICD-10-CM | POA: Diagnosis not present

## 2016-09-08 DIAGNOSIS — D631 Anemia in chronic kidney disease: Secondary | ICD-10-CM | POA: Diagnosis not present

## 2016-09-08 DIAGNOSIS — N2581 Secondary hyperparathyroidism of renal origin: Secondary | ICD-10-CM | POA: Diagnosis not present

## 2016-09-08 DIAGNOSIS — N186 End stage renal disease: Secondary | ICD-10-CM | POA: Diagnosis not present

## 2016-09-11 ENCOUNTER — Ambulatory Visit: Payer: Medicare Other

## 2016-09-11 DIAGNOSIS — D631 Anemia in chronic kidney disease: Secondary | ICD-10-CM | POA: Diagnosis not present

## 2016-09-11 DIAGNOSIS — I82409 Acute embolism and thrombosis of unspecified deep veins of unspecified lower extremity: Secondary | ICD-10-CM | POA: Diagnosis not present

## 2016-09-11 DIAGNOSIS — N186 End stage renal disease: Secondary | ICD-10-CM | POA: Diagnosis not present

## 2016-09-11 DIAGNOSIS — N2581 Secondary hyperparathyroidism of renal origin: Secondary | ICD-10-CM | POA: Diagnosis not present

## 2016-09-13 ENCOUNTER — Ambulatory Visit: Payer: Medicare Other

## 2016-09-15 DIAGNOSIS — N186 End stage renal disease: Secondary | ICD-10-CM | POA: Diagnosis not present

## 2016-09-15 DIAGNOSIS — N2581 Secondary hyperparathyroidism of renal origin: Secondary | ICD-10-CM | POA: Diagnosis not present

## 2016-09-15 DIAGNOSIS — D631 Anemia in chronic kidney disease: Secondary | ICD-10-CM | POA: Diagnosis not present

## 2016-09-18 DIAGNOSIS — D631 Anemia in chronic kidney disease: Secondary | ICD-10-CM | POA: Diagnosis not present

## 2016-09-18 DIAGNOSIS — N2581 Secondary hyperparathyroidism of renal origin: Secondary | ICD-10-CM | POA: Diagnosis not present

## 2016-09-18 DIAGNOSIS — N186 End stage renal disease: Secondary | ICD-10-CM | POA: Diagnosis not present

## 2016-09-22 DIAGNOSIS — N186 End stage renal disease: Secondary | ICD-10-CM | POA: Diagnosis not present

## 2016-09-22 DIAGNOSIS — N2581 Secondary hyperparathyroidism of renal origin: Secondary | ICD-10-CM | POA: Diagnosis not present

## 2016-09-25 DIAGNOSIS — N186 End stage renal disease: Secondary | ICD-10-CM | POA: Diagnosis not present

## 2016-09-25 DIAGNOSIS — N2581 Secondary hyperparathyroidism of renal origin: Secondary | ICD-10-CM | POA: Diagnosis not present

## 2016-09-25 DIAGNOSIS — I82409 Acute embolism and thrombosis of unspecified deep veins of unspecified lower extremity: Secondary | ICD-10-CM | POA: Diagnosis not present

## 2016-09-26 ENCOUNTER — Ambulatory Visit: Payer: Medicare Other

## 2016-09-29 DIAGNOSIS — N186 End stage renal disease: Secondary | ICD-10-CM | POA: Diagnosis not present

## 2016-09-29 DIAGNOSIS — N2581 Secondary hyperparathyroidism of renal origin: Secondary | ICD-10-CM | POA: Diagnosis not present

## 2016-09-30 DIAGNOSIS — N186 End stage renal disease: Secondary | ICD-10-CM | POA: Diagnosis not present

## 2016-09-30 DIAGNOSIS — Z992 Dependence on renal dialysis: Secondary | ICD-10-CM | POA: Diagnosis not present

## 2016-10-02 DIAGNOSIS — N186 End stage renal disease: Secondary | ICD-10-CM | POA: Diagnosis not present

## 2016-10-02 DIAGNOSIS — I82409 Acute embolism and thrombosis of unspecified deep veins of unspecified lower extremity: Secondary | ICD-10-CM | POA: Diagnosis not present

## 2016-10-02 DIAGNOSIS — D509 Iron deficiency anemia, unspecified: Secondary | ICD-10-CM | POA: Diagnosis not present

## 2016-10-02 DIAGNOSIS — D631 Anemia in chronic kidney disease: Secondary | ICD-10-CM | POA: Diagnosis not present

## 2016-10-02 DIAGNOSIS — N2581 Secondary hyperparathyroidism of renal origin: Secondary | ICD-10-CM | POA: Diagnosis not present

## 2016-10-06 DIAGNOSIS — D509 Iron deficiency anemia, unspecified: Secondary | ICD-10-CM | POA: Diagnosis not present

## 2016-10-06 DIAGNOSIS — N2581 Secondary hyperparathyroidism of renal origin: Secondary | ICD-10-CM | POA: Diagnosis not present

## 2016-10-06 DIAGNOSIS — N186 End stage renal disease: Secondary | ICD-10-CM | POA: Diagnosis not present

## 2016-10-06 DIAGNOSIS — D631 Anemia in chronic kidney disease: Secondary | ICD-10-CM | POA: Diagnosis not present

## 2016-10-09 DIAGNOSIS — D509 Iron deficiency anemia, unspecified: Secondary | ICD-10-CM | POA: Diagnosis not present

## 2016-10-09 DIAGNOSIS — N186 End stage renal disease: Secondary | ICD-10-CM | POA: Diagnosis not present

## 2016-10-09 DIAGNOSIS — N2581 Secondary hyperparathyroidism of renal origin: Secondary | ICD-10-CM | POA: Diagnosis not present

## 2016-10-09 DIAGNOSIS — I82409 Acute embolism and thrombosis of unspecified deep veins of unspecified lower extremity: Secondary | ICD-10-CM | POA: Diagnosis not present

## 2016-10-09 DIAGNOSIS — D631 Anemia in chronic kidney disease: Secondary | ICD-10-CM | POA: Diagnosis not present

## 2016-10-14 DIAGNOSIS — N186 End stage renal disease: Secondary | ICD-10-CM | POA: Diagnosis not present

## 2016-10-14 DIAGNOSIS — D509 Iron deficiency anemia, unspecified: Secondary | ICD-10-CM | POA: Diagnosis not present

## 2016-10-14 DIAGNOSIS — N2581 Secondary hyperparathyroidism of renal origin: Secondary | ICD-10-CM | POA: Diagnosis not present

## 2016-10-16 DIAGNOSIS — N186 End stage renal disease: Secondary | ICD-10-CM | POA: Diagnosis not present

## 2016-10-16 DIAGNOSIS — N2581 Secondary hyperparathyroidism of renal origin: Secondary | ICD-10-CM | POA: Diagnosis not present

## 2016-10-16 DIAGNOSIS — D509 Iron deficiency anemia, unspecified: Secondary | ICD-10-CM | POA: Diagnosis not present

## 2016-10-20 DIAGNOSIS — N186 End stage renal disease: Secondary | ICD-10-CM | POA: Diagnosis not present

## 2016-10-20 DIAGNOSIS — I82409 Acute embolism and thrombosis of unspecified deep veins of unspecified lower extremity: Secondary | ICD-10-CM | POA: Diagnosis not present

## 2016-10-20 DIAGNOSIS — N2581 Secondary hyperparathyroidism of renal origin: Secondary | ICD-10-CM | POA: Diagnosis not present

## 2016-10-20 DIAGNOSIS — D509 Iron deficiency anemia, unspecified: Secondary | ICD-10-CM | POA: Diagnosis not present

## 2016-10-21 DIAGNOSIS — Z7901 Long term (current) use of anticoagulants: Secondary | ICD-10-CM | POA: Diagnosis not present

## 2016-10-21 DIAGNOSIS — J449 Chronic obstructive pulmonary disease, unspecified: Secondary | ICD-10-CM | POA: Diagnosis not present

## 2016-10-21 DIAGNOSIS — M85871 Other specified disorders of bone density and structure, right ankle and foot: Secondary | ICD-10-CM | POA: Diagnosis not present

## 2016-10-21 DIAGNOSIS — S99921A Unspecified injury of right foot, initial encounter: Secondary | ICD-10-CM | POA: Diagnosis not present

## 2016-10-21 DIAGNOSIS — Z882 Allergy status to sulfonamides status: Secondary | ICD-10-CM | POA: Diagnosis not present

## 2016-10-21 DIAGNOSIS — Z79899 Other long term (current) drug therapy: Secondary | ICD-10-CM | POA: Diagnosis not present

## 2016-10-21 DIAGNOSIS — Z992 Dependence on renal dialysis: Secondary | ICD-10-CM | POA: Diagnosis not present

## 2016-10-21 DIAGNOSIS — Z87891 Personal history of nicotine dependence: Secondary | ICD-10-CM | POA: Diagnosis not present

## 2016-10-21 DIAGNOSIS — L03115 Cellulitis of right lower limb: Secondary | ICD-10-CM | POA: Diagnosis not present

## 2016-10-21 DIAGNOSIS — N186 End stage renal disease: Secondary | ICD-10-CM | POA: Diagnosis not present

## 2016-10-21 DIAGNOSIS — Z881 Allergy status to other antibiotic agents status: Secondary | ICD-10-CM | POA: Diagnosis not present

## 2016-10-21 DIAGNOSIS — Z885 Allergy status to narcotic agent status: Secondary | ICD-10-CM | POA: Diagnosis not present

## 2016-10-21 DIAGNOSIS — K219 Gastro-esophageal reflux disease without esophagitis: Secondary | ICD-10-CM | POA: Diagnosis not present

## 2016-10-21 DIAGNOSIS — Z888 Allergy status to other drugs, medicaments and biological substances status: Secondary | ICD-10-CM | POA: Diagnosis not present

## 2016-10-21 DIAGNOSIS — M7731 Calcaneal spur, right foot: Secondary | ICD-10-CM | POA: Diagnosis not present

## 2016-10-23 DIAGNOSIS — T8249XA Other complication of vascular dialysis catheter, initial encounter: Secondary | ICD-10-CM | POA: Diagnosis not present

## 2016-10-23 DIAGNOSIS — N186 End stage renal disease: Secondary | ICD-10-CM | POA: Diagnosis not present

## 2016-10-23 DIAGNOSIS — N2581 Secondary hyperparathyroidism of renal origin: Secondary | ICD-10-CM | POA: Diagnosis not present

## 2016-10-23 DIAGNOSIS — D631 Anemia in chronic kidney disease: Secondary | ICD-10-CM | POA: Diagnosis not present

## 2016-10-23 DIAGNOSIS — I82409 Acute embolism and thrombosis of unspecified deep veins of unspecified lower extremity: Secondary | ICD-10-CM | POA: Diagnosis not present

## 2016-10-27 DIAGNOSIS — T8249XA Other complication of vascular dialysis catheter, initial encounter: Secondary | ICD-10-CM | POA: Diagnosis not present

## 2016-10-27 DIAGNOSIS — N2581 Secondary hyperparathyroidism of renal origin: Secondary | ICD-10-CM | POA: Diagnosis not present

## 2016-10-27 DIAGNOSIS — N186 End stage renal disease: Secondary | ICD-10-CM | POA: Diagnosis not present

## 2016-10-27 DIAGNOSIS — I82409 Acute embolism and thrombosis of unspecified deep veins of unspecified lower extremity: Secondary | ICD-10-CM | POA: Diagnosis not present

## 2016-10-27 DIAGNOSIS — D631 Anemia in chronic kidney disease: Secondary | ICD-10-CM | POA: Diagnosis not present

## 2016-10-30 DIAGNOSIS — N2581 Secondary hyperparathyroidism of renal origin: Secondary | ICD-10-CM | POA: Diagnosis not present

## 2016-10-30 DIAGNOSIS — T8249XA Other complication of vascular dialysis catheter, initial encounter: Secondary | ICD-10-CM | POA: Diagnosis not present

## 2016-10-30 DIAGNOSIS — D631 Anemia in chronic kidney disease: Secondary | ICD-10-CM | POA: Diagnosis not present

## 2016-10-30 DIAGNOSIS — N186 End stage renal disease: Secondary | ICD-10-CM | POA: Diagnosis not present

## 2016-10-31 DIAGNOSIS — N186 End stage renal disease: Secondary | ICD-10-CM | POA: Diagnosis not present

## 2016-10-31 DIAGNOSIS — Z992 Dependence on renal dialysis: Secondary | ICD-10-CM | POA: Diagnosis not present

## 2016-11-03 DIAGNOSIS — N2581 Secondary hyperparathyroidism of renal origin: Secondary | ICD-10-CM | POA: Diagnosis not present

## 2016-11-03 DIAGNOSIS — I82409 Acute embolism and thrombosis of unspecified deep veins of unspecified lower extremity: Secondary | ICD-10-CM | POA: Diagnosis not present

## 2016-11-03 DIAGNOSIS — N186 End stage renal disease: Secondary | ICD-10-CM | POA: Diagnosis not present

## 2016-11-03 DIAGNOSIS — D509 Iron deficiency anemia, unspecified: Secondary | ICD-10-CM | POA: Diagnosis not present

## 2016-11-06 DIAGNOSIS — I82409 Acute embolism and thrombosis of unspecified deep veins of unspecified lower extremity: Secondary | ICD-10-CM | POA: Diagnosis not present

## 2016-11-06 DIAGNOSIS — N186 End stage renal disease: Secondary | ICD-10-CM | POA: Diagnosis not present

## 2016-11-06 DIAGNOSIS — D509 Iron deficiency anemia, unspecified: Secondary | ICD-10-CM | POA: Diagnosis not present

## 2016-11-06 DIAGNOSIS — N2581 Secondary hyperparathyroidism of renal origin: Secondary | ICD-10-CM | POA: Diagnosis not present

## 2016-11-10 DIAGNOSIS — D509 Iron deficiency anemia, unspecified: Secondary | ICD-10-CM | POA: Diagnosis not present

## 2016-11-10 DIAGNOSIS — N2581 Secondary hyperparathyroidism of renal origin: Secondary | ICD-10-CM | POA: Diagnosis not present

## 2016-11-10 DIAGNOSIS — N186 End stage renal disease: Secondary | ICD-10-CM | POA: Diagnosis not present

## 2016-11-13 DIAGNOSIS — N2581 Secondary hyperparathyroidism of renal origin: Secondary | ICD-10-CM | POA: Diagnosis not present

## 2016-11-13 DIAGNOSIS — N186 End stage renal disease: Secondary | ICD-10-CM | POA: Diagnosis not present

## 2016-11-13 DIAGNOSIS — D509 Iron deficiency anemia, unspecified: Secondary | ICD-10-CM | POA: Diagnosis not present

## 2016-11-13 DIAGNOSIS — I82409 Acute embolism and thrombosis of unspecified deep veins of unspecified lower extremity: Secondary | ICD-10-CM | POA: Diagnosis not present

## 2016-11-17 DIAGNOSIS — D509 Iron deficiency anemia, unspecified: Secondary | ICD-10-CM | POA: Diagnosis not present

## 2016-11-17 DIAGNOSIS — N186 End stage renal disease: Secondary | ICD-10-CM | POA: Diagnosis not present

## 2016-11-17 DIAGNOSIS — N2581 Secondary hyperparathyroidism of renal origin: Secondary | ICD-10-CM | POA: Diagnosis not present

## 2016-11-20 DIAGNOSIS — N186 End stage renal disease: Secondary | ICD-10-CM | POA: Diagnosis not present

## 2016-11-20 DIAGNOSIS — D509 Iron deficiency anemia, unspecified: Secondary | ICD-10-CM | POA: Diagnosis not present

## 2016-11-20 DIAGNOSIS — N2581 Secondary hyperparathyroidism of renal origin: Secondary | ICD-10-CM | POA: Diagnosis not present

## 2016-11-20 DIAGNOSIS — I82409 Acute embolism and thrombosis of unspecified deep veins of unspecified lower extremity: Secondary | ICD-10-CM | POA: Diagnosis not present

## 2016-11-24 DIAGNOSIS — N186 End stage renal disease: Secondary | ICD-10-CM | POA: Diagnosis not present

## 2016-11-24 DIAGNOSIS — N2581 Secondary hyperparathyroidism of renal origin: Secondary | ICD-10-CM | POA: Diagnosis not present

## 2016-11-27 DIAGNOSIS — I82409 Acute embolism and thrombosis of unspecified deep veins of unspecified lower extremity: Secondary | ICD-10-CM | POA: Diagnosis not present

## 2016-11-27 DIAGNOSIS — N2581 Secondary hyperparathyroidism of renal origin: Secondary | ICD-10-CM | POA: Diagnosis not present

## 2016-11-27 DIAGNOSIS — N186 End stage renal disease: Secondary | ICD-10-CM | POA: Diagnosis not present

## 2016-11-29 ENCOUNTER — Ambulatory Visit (INDEPENDENT_AMBULATORY_CARE_PROVIDER_SITE_OTHER): Payer: Medicare Other | Admitting: Physician Assistant

## 2016-11-29 ENCOUNTER — Encounter: Payer: Self-pay | Admitting: Physician Assistant

## 2016-11-29 VITALS — BP 91/66 | HR 76 | Temp 98.0°F | Wt 87.0 lb

## 2016-11-29 DIAGNOSIS — Z881 Allergy status to other antibiotic agents status: Secondary | ICD-10-CM

## 2016-11-29 DIAGNOSIS — R0989 Other specified symptoms and signs involving the circulatory and respiratory systems: Secondary | ICD-10-CM | POA: Diagnosis not present

## 2016-11-29 DIAGNOSIS — L03119 Cellulitis of unspecified part of limb: Secondary | ICD-10-CM

## 2016-11-29 DIAGNOSIS — D510 Vitamin B12 deficiency anemia due to intrinsic factor deficiency: Secondary | ICD-10-CM

## 2016-11-29 DIAGNOSIS — T148XXD Other injury of unspecified body region, subsequent encounter: Secondary | ICD-10-CM

## 2016-11-29 DIAGNOSIS — Z992 Dependence on renal dialysis: Secondary | ICD-10-CM | POA: Diagnosis not present

## 2016-11-29 DIAGNOSIS — N186 End stage renal disease: Secondary | ICD-10-CM

## 2016-11-29 LAB — CBC WITH DIFFERENTIAL/PLATELET
BASOS ABS: 0 {cells}/uL (ref 0–200)
Basophils Relative: 0 %
EOS PCT: 1 %
Eosinophils Absolute: 93 cells/uL (ref 15–500)
HCT: 38.8 % (ref 35.0–45.0)
HEMOGLOBIN: 12 g/dL (ref 11.7–15.5)
LYMPHS ABS: 1488 {cells}/uL (ref 850–3900)
Lymphocytes Relative: 16 %
MCH: 28.7 pg (ref 27.0–33.0)
MCHC: 30.9 g/dL — AB (ref 32.0–36.0)
MCV: 92.8 fL (ref 80.0–100.0)
MONOS PCT: 6 %
MPV: 11.8 fL (ref 7.5–12.5)
Monocytes Absolute: 558 cells/uL (ref 200–950)
NEUTROS ABS: 7161 {cells}/uL (ref 1500–7800)
Neutrophils Relative %: 77 %
PLATELETS: 258 10*3/uL (ref 140–400)
RBC: 4.18 MIL/uL (ref 3.80–5.10)
RDW: 15.2 % — ABNORMAL HIGH (ref 11.0–15.0)
WBC: 9.3 10*3/uL (ref 3.8–10.8)

## 2016-11-29 MED ORDER — CLINDAMYCIN HCL 300 MG PO CAPS: 300.0000 mg | ORAL_CAPSULE | Freq: Two times a day (BID) | ORAL | 0 refills | Status: AC

## 2016-11-29 NOTE — Progress Notes (Addendum)
HPI:                                                                Gabrielle Stafford is a 83 y.o. female who presents to [facility] today for bilateral foot pain  Patient with PMH ESRD on dialysis (M,F), DVT, PE, colon cancer, short gun syndrome, CAD presents with recurrent bilateral foot pain and swelling. She was diagnosed with right foot cellulitis on July 21st and treated with oral Clindamycin. Reports symptoms improved, but never fully went away. She endorses redness, swelling and tenderness of her distal foot and toes. She does report having pedicures at a nail salon. Denies fever, chills, nausea, vomiting.   Her nephrologist is Dr. Loura Back and she has dialysis on Mondays and Fridays.   Past Medical History:  Diagnosis Date  . Arthritis   . Cancer ([place])   . Cardiac arrest ([place])   . DVT (deep venous thrombosis) ([place])   . Hip fracture ([facility])    Left  . Hypokalemia   . PE (pulmonary thromboembolism) ([place])   . Short gut syndrome   . Stented coronary artery    Past Surgical History:  Procedure Laterality Date  . CATARACT EXTRACTION, BILATERAL  1999  . CHOLECYSTECTOMY  1999  . COLON SURGERY  1999   colon cancer   Social History  Substance Use Topics  . Smoking status: Former Smoker    Types: Cigarettes    Quit date: 04/03/1997  . Smokeless tobacco: Never Used  . Alcohol use No   family history includes Alcohol abuse in her father; Arthritis in her unknown relative; Breast cancer in her sister; Cancer in her father; Colon cancer in her mother; Depression in her mother; Diabetes in her father and sister; Gout in her father; Heart attack in her father; Hyperlipidemia in her mother; Hypertension in her father; Pulmonary embolism in her mother.  ROS: negative except as noted in the HPI  Medications: Current Outpatient Prescriptions  Medication Sig Dispense Refill  . Acetaminophen 650 MG TABS Take 1 tablet by mouth as  needed.    . calcium carbonate (TUMS - DOSED IN MG ELEMENTAL CALCIUM) 500 MG chewable tablet Chew 2-3 tablets by mouth 3 (three) times daily after meals.     . cholecalciferol (VITAMIN D) 1000 UNITS tablet Take 1,000 Units by mouth.    . cyanocobalamin (,VITAMIN B-12,) 1000 MCG/ML injection Inject 1,000 mcg into the muscle every 30 (thirty) days.    . folic acid-vitamin b complex-vitamin c-selenium-zinc (DIALYVITE) 3 MG TABS tablet Take 1 tablet by mouth.    . magnesium gluconate (MAGONATE) 500 MG tablet Take 250 mg by mouth 2 (two) times daily.    Marland Kitchen warfarin (COUMADIN) 2 MG tablet TAKE 1 TABLET DAILY. (Patient taking differently: TAKE 5mg   TABLET DAILY.) 90 tablet 3  . clindamycin (CLEOCIN) 300 MG capsule Take 1 capsule (300 mg total) by mouth 2 (two) times daily. 20 capsule 0   No current facility-administered medications for this visit.    Allergies  Allergen Reactions  . Metoprolol Anaphylaxis  . Furosemide     Other reaction(s): Dizziness  . Alendronate Sodium   . Amoxicillin Diarrhea  . Azithromycin   . Ciprofloxacin   . Diltiazem Hcl  Er     diarrhea  . Nitrofurantoin   . Sulfamethoxazole-Trimethoprim   . Tramadol Hcl     REACTION: nausea  . Keflex [Cephalexin] Itching and Rash       Objective:  BP 91/66   Pulse 76   Temp 98 F (36.7 C) (Oral)   Wt 87 lb (39.5 kg)   BMI 16.85 kg/m  Gen:  alert, not ill-appearing, no distress, appropriate for age HEENT: head normocephalic without obvious abnormality, conjunctiva and cornea clear, trachea midline Pulm: Normal work of breathing, normal phonation MSK: ambulating with walker, 2+ pitting edema in bilateral lower extremities to the ankle, unable to palpate distal pulses, normal capillary refill Skin: bilateral distal forefoot and phalanges with erythema, warmth, and tenderness, overlying skin is scaly, bilateral great toes with area of necrosis at the distal edge of the nail plate      No results found for this or any  previous visit (from the past 72 hour(s)). No results found.    Assessment and Plan: 83 y.o. female with   1. Cellulitis of foot, Delayed wound healing - AMB referral to wound care center to manage necrosis of bilateral great toes likely 2/2 peripheral artery disease - left a voicemail with Dr. Antionette Fairy to discuss IV antibiotics on dialysis days. Given likely PAD and lower extremity infection, recommend IV Vancomycin - clindamycin (CLEOCIN) 300 MG capsule; Take 1 capsule (300 mg total) by mouth 2 (two) times daily.  Dispense: 20 capsule; Refill: 0 - CBC with Differential/Platelet  2. Pernicious anemia - due for serum B12 labs - Vitamin B12  3. ESRD (end stage renal disease) on dialysis ([facility]) - COMPLETE METABOLIC PANEL WITH GFR  4. Diminished pulses in lower extremities - ABI's ordered  Patient education and anticipatory guidance given Patient agrees with treatment plan Follow-up in 2 days with PCP for wound check or sooner as needed if symptoms worsen or fail to improve  Darlyne Russian PA-C

## 2016-11-29 NOTE — Patient Instructions (Addendum)
-   Start Clindamycin twice a day for 10 days - We are contacting your kidney doctor to arrange antibiotics with your dialysis - We are referring you to a wound clinic for possible hyperbaric oxygen therapy to help with healing - NO PEDICURES - elevate your legs above the level of your heart - follow-up with Dr. Joellyn Quails in 2 days or sooner if you develop fever, chills, worsening pain, spreading redness, or worsening symptoms   Cellulitis, Adult Cellulitis is a skin infection. The infected area is usually red and sore. This condition occurs most often in the arms and lower legs. It is very important to get treated for this condition. Follow these instructions at home:  Take over-the-counter and prescription medicines only as told by your doctor.  If you were prescribed an antibiotic medicine, take it as told by your doctor. Do not stop taking the antibiotic even if you start to feel better.  Drink enough fluid to keep your pee (urine) clear or pale yellow.  Do not touch or rub the infected area.  Raise (elevate) the infected area above the level of your heart while you are sitting or lying down.  Place warm or cold wet cloths (warm or cold compresses) on the infected area. Do this as told by your doctor.  Keep all follow-up visits as told by your doctor. This is important. These visits let your doctor make sure your infection is not getting worse. Contact a doctor if:  You have a fever.  Your symptoms do not get better after 1-2 days of treatment.  Your bone or joint under the infected area starts to hurt after the skin has healed.  Your infection comes back. This can happen in the same area or another area.  You have a swollen bump in the infected area.  You have new symptoms.  You feel ill and also have muscle aches and pains. Get help right away if:  Your symptoms get worse.  You feel very sleepy.  You throw up (vomit) or have watery poop (diarrhea) for a long  time.  There are red streaks coming from the infected area.  Your red area gets larger.  Your red area turns darker. This information is not intended to replace advice given to you by your health care provider. Make sure you discuss any questions you have with your health care provider. Document Released: 09/06/2007 Document Revised: 08/26/2015 Document Reviewed: 01/27/2015 Elsevier Interactive Patient Education  2018 [employer].

## 2016-11-30 DIAGNOSIS — Z881 Allergy status to other antibiotic agents status: Secondary | ICD-10-CM | POA: Insufficient documentation

## 2016-11-30 DIAGNOSIS — R0989 Other specified symptoms and signs involving the circulatory and respiratory systems: Secondary | ICD-10-CM | POA: Insufficient documentation

## 2016-11-30 DIAGNOSIS — T148XXD Other injury of unspecified body region, subsequent encounter: Secondary | ICD-10-CM | POA: Insufficient documentation

## 2016-11-30 LAB — COMPLETE METABOLIC PANEL WITH GFR
ALBUMIN: 3.4 g/dL — AB (ref 3.6–5.1)
ALK PHOS: 107 U/L (ref 33–130)
ALT: 14 U/L (ref 6–29)
AST: 13 U/L (ref 10–35)
BILIRUBIN TOTAL: 0.3 mg/dL (ref 0.2–1.2)
BUN: 30 mg/dL — AB (ref 7–25)
CALCIUM: 7.6 mg/dL — AB (ref 8.6–10.4)
CHLORIDE: 110 mmol/L (ref 98–110)
CO2: 13 mmol/L — AB (ref 20–32)
Creat: 7.53 mg/dL — ABNORMAL HIGH (ref 0.60–0.88)
GFR, Est African American: 5 mL/min — ABNORMAL LOW (ref >=60)
GFR, Est Non African American: 4 mL/min — ABNORMAL LOW (ref >=60)
Glucose, Bld: 102 mg/dL — ABNORMAL HIGH (ref 65–99)
POTASSIUM: 3.5 mmol/L (ref 3.5–5.3)
SODIUM: 141 mmol/L (ref 135–146)
Total Protein: 6.1 g/dL (ref 6.1–8.1)

## 2016-11-30 LAB — VITAMIN B12: Vitamin B-12: 323 pg/mL (ref 200–1100)

## 2016-11-30 NOTE — Progress Notes (Signed)
B12 is on the low end of normal. Okay to return for B12 injection There is no white blood cell count, which means the foot infection is not systemic. This is good news. Please let us know if you are not improving in 24-48 hours with the Clindamycin. If no improvement, will send a new antibiotic called Doxycycline to the pharmacy.

## 2016-11-30 NOTE — Progress Notes (Signed)
If no improvement on Clindamycin, will switch to oral doxycycline If no improvement in 24-48 hours, will recommend IV antibiotic therapy at dialysis

## 2016-12-01 DIAGNOSIS — Z992 Dependence on renal dialysis: Secondary | ICD-10-CM | POA: Diagnosis not present

## 2016-12-01 DIAGNOSIS — N2581 Secondary hyperparathyroidism of renal origin: Secondary | ICD-10-CM | POA: Diagnosis not present

## 2016-12-01 DIAGNOSIS — N186 End stage renal disease: Secondary | ICD-10-CM | POA: Diagnosis not present

## 2016-12-04 DIAGNOSIS — N186 End stage renal disease: Secondary | ICD-10-CM | POA: Diagnosis not present

## 2016-12-04 DIAGNOSIS — D509 Iron deficiency anemia, unspecified: Secondary | ICD-10-CM | POA: Diagnosis not present

## 2016-12-04 DIAGNOSIS — N2581 Secondary hyperparathyroidism of renal origin: Secondary | ICD-10-CM | POA: Diagnosis not present

## 2016-12-07 ENCOUNTER — Ambulatory Visit: Payer: Medicare Other | Admitting: Family Medicine

## 2016-12-07 DIAGNOSIS — K922 Gastrointestinal hemorrhage, unspecified: Secondary | ICD-10-CM | POA: Diagnosis not present

## 2016-12-07 DIAGNOSIS — I129 Hypertensive chronic kidney disease with stage 1 through stage 4 chronic kidney disease, or unspecified chronic kidney disease: Secondary | ICD-10-CM | POA: Diagnosis not present

## 2016-12-07 DIAGNOSIS — I70298 Other atherosclerosis of native arteries of extremities, other extremity: Secondary | ICD-10-CM | POA: Diagnosis not present

## 2016-12-07 DIAGNOSIS — I82409 Acute embolism and thrombosis of unspecified deep veins of unspecified lower extremity: Secondary | ICD-10-CM | POA: Diagnosis not present

## 2016-12-07 DIAGNOSIS — Z86711 Personal history of pulmonary embolism: Secondary | ICD-10-CM | POA: Diagnosis not present

## 2016-12-07 DIAGNOSIS — Z48815 Encounter for surgical aftercare following surgery on the digestive system: Secondary | ICD-10-CM | POA: Diagnosis not present

## 2016-12-07 DIAGNOSIS — R42 Dizziness and giddiness: Secondary | ICD-10-CM | POA: Diagnosis not present

## 2016-12-07 DIAGNOSIS — Z85038 Personal history of other malignant neoplasm of large intestine: Secondary | ICD-10-CM | POA: Diagnosis not present

## 2016-12-07 DIAGNOSIS — M79671 Pain in right foot: Secondary | ICD-10-CM | POA: Diagnosis not present

## 2016-12-07 DIAGNOSIS — S91109A Unspecified open wound of unspecified toe(s) without damage to nail, initial encounter: Secondary | ICD-10-CM | POA: Diagnosis not present

## 2016-12-07 DIAGNOSIS — M7989 Other specified soft tissue disorders: Secondary | ICD-10-CM | POA: Diagnosis not present

## 2016-12-07 DIAGNOSIS — Z9181 History of falling: Secondary | ICD-10-CM | POA: Diagnosis not present

## 2016-12-07 DIAGNOSIS — M79672 Pain in left foot: Secondary | ICD-10-CM | POA: Diagnosis not present

## 2016-12-07 DIAGNOSIS — I96 Gangrene, not elsewhere classified: Secondary | ICD-10-CM | POA: Diagnosis not present

## 2016-12-07 DIAGNOSIS — K921 Melena: Secondary | ICD-10-CM | POA: Diagnosis not present

## 2016-12-07 DIAGNOSIS — I2699 Other pulmonary embolism without acute cor pulmonale: Secondary | ICD-10-CM | POA: Diagnosis not present

## 2016-12-07 DIAGNOSIS — Z888 Allergy status to other drugs, medicaments and biological substances status: Secondary | ICD-10-CM | POA: Diagnosis not present

## 2016-12-07 DIAGNOSIS — M79675 Pain in left toe(s): Secondary | ICD-10-CM | POA: Diagnosis not present

## 2016-12-07 DIAGNOSIS — R410 Disorientation, unspecified: Secondary | ICD-10-CM | POA: Diagnosis not present

## 2016-12-07 DIAGNOSIS — Z9221 Personal history of antineoplastic chemotherapy: Secondary | ICD-10-CM | POA: Diagnosis not present

## 2016-12-07 DIAGNOSIS — M810 Age-related osteoporosis without current pathological fracture: Secondary | ICD-10-CM | POA: Diagnosis not present

## 2016-12-07 DIAGNOSIS — R918 Other nonspecific abnormal finding of lung field: Secondary | ICD-10-CM | POA: Diagnosis not present

## 2016-12-07 DIAGNOSIS — Z87891 Personal history of nicotine dependence: Secondary | ICD-10-CM | POA: Diagnosis not present

## 2016-12-07 DIAGNOSIS — D500 Iron deficiency anemia secondary to blood loss (chronic): Secondary | ICD-10-CM | POA: Diagnosis not present

## 2016-12-07 DIAGNOSIS — E785 Hyperlipidemia, unspecified: Secondary | ICD-10-CM | POA: Diagnosis not present

## 2016-12-07 DIAGNOSIS — E872 Acidosis: Secondary | ICD-10-CM | POA: Diagnosis not present

## 2016-12-07 DIAGNOSIS — T45511A Poisoning by anticoagulants, accidental (unintentional), initial encounter: Secondary | ICD-10-CM | POA: Diagnosis not present

## 2016-12-07 DIAGNOSIS — K449 Diaphragmatic hernia without obstruction or gangrene: Secondary | ICD-10-CM | POA: Diagnosis not present

## 2016-12-07 DIAGNOSIS — R197 Diarrhea, unspecified: Secondary | ICD-10-CM | POA: Diagnosis not present

## 2016-12-07 DIAGNOSIS — D62 Acute posthemorrhagic anemia: Secondary | ICD-10-CM | POA: Diagnosis not present

## 2016-12-07 DIAGNOSIS — J929 Pleural plaque without asbestos: Secondary | ICD-10-CM | POA: Diagnosis not present

## 2016-12-07 DIAGNOSIS — I70268 Atherosclerosis of native arteries of extremities with gangrene, other extremity: Secondary | ICD-10-CM | POA: Diagnosis not present

## 2016-12-07 DIAGNOSIS — Z9049 Acquired absence of other specified parts of digestive tract: Secondary | ICD-10-CM | POA: Diagnosis not present

## 2016-12-07 DIAGNOSIS — I7389 Other specified peripheral vascular diseases: Secondary | ICD-10-CM | POA: Diagnosis not present

## 2016-12-07 DIAGNOSIS — Z992 Dependence on renal dialysis: Secondary | ICD-10-CM | POA: Diagnosis not present

## 2016-12-07 DIAGNOSIS — Z881 Allergy status to other antibiotic agents status: Secondary | ICD-10-CM | POA: Diagnosis not present

## 2016-12-07 DIAGNOSIS — R262 Difficulty in walking, not elsewhere classified: Secondary | ICD-10-CM | POA: Diagnosis not present

## 2016-12-07 DIAGNOSIS — J449 Chronic obstructive pulmonary disease, unspecified: Secondary | ICD-10-CM | POA: Diagnosis not present

## 2016-12-07 DIAGNOSIS — K5521 Angiodysplasia of colon with hemorrhage: Secondary | ICD-10-CM | POA: Diagnosis not present

## 2016-12-07 DIAGNOSIS — Z7901 Long term (current) use of anticoagulants: Secondary | ICD-10-CM | POA: Diagnosis not present

## 2016-12-07 DIAGNOSIS — N186 End stage renal disease: Secondary | ICD-10-CM | POA: Diagnosis not present

## 2016-12-07 DIAGNOSIS — Z923 Personal history of irradiation: Secondary | ICD-10-CM | POA: Diagnosis not present

## 2016-12-07 DIAGNOSIS — R404 Transient alteration of awareness: Secondary | ICD-10-CM | POA: Diagnosis not present

## 2016-12-07 DIAGNOSIS — E876 Hypokalemia: Secondary | ICD-10-CM | POA: Diagnosis not present

## 2016-12-07 DIAGNOSIS — I739 Peripheral vascular disease, unspecified: Secondary | ICD-10-CM | POA: Diagnosis not present

## 2016-12-07 DIAGNOSIS — R531 Weakness: Secondary | ICD-10-CM | POA: Diagnosis not present

## 2016-12-07 DIAGNOSIS — R748 Abnormal levels of other serum enzymes: Secondary | ICD-10-CM | POA: Diagnosis not present

## 2016-12-07 DIAGNOSIS — Z86718 Personal history of other venous thrombosis and embolism: Secondary | ICD-10-CM | POA: Diagnosis not present

## 2016-12-07 DIAGNOSIS — M6281 Muscle weakness (generalized): Secondary | ICD-10-CM | POA: Diagnosis not present

## 2016-12-07 DIAGNOSIS — K31811 Angiodysplasia of stomach and duodenum with bleeding: Secondary | ICD-10-CM | POA: Diagnosis not present

## 2016-12-07 NOTE — Progress Notes (Deleted)
   Subjective:    Patient ID: Gabrielle Stafford, female    DOB: 1929/05/13, 83 y.o.   MRN: [id number]  HPI F/U cellulitis of right foot-    Review of Systems     Objective:   Physical Exam        Assessment & Plan:  Cellulitis of right foot -

## 2016-12-08 ENCOUNTER — Ambulatory Visit: Payer: Medicare Other | Admitting: Family Medicine

## 2016-12-08 DIAGNOSIS — K922 Gastrointestinal hemorrhage, unspecified: Secondary | ICD-10-CM | POA: Diagnosis not present

## 2016-12-20 DIAGNOSIS — D631 Anemia in chronic kidney disease: Secondary | ICD-10-CM | POA: Diagnosis not present

## 2016-12-20 DIAGNOSIS — Z23 Encounter for immunization: Secondary | ICD-10-CM | POA: Diagnosis not present

## 2016-12-20 DIAGNOSIS — R791 Abnormal coagulation profile: Secondary | ICD-10-CM | POA: Diagnosis not present

## 2016-12-20 DIAGNOSIS — E785 Hyperlipidemia, unspecified: Secondary | ICD-10-CM | POA: Diagnosis not present

## 2016-12-20 DIAGNOSIS — I129 Hypertensive chronic kidney disease with stage 1 through stage 4 chronic kidney disease, or unspecified chronic kidney disease: Secondary | ICD-10-CM | POA: Diagnosis not present

## 2016-12-20 DIAGNOSIS — I82409 Acute embolism and thrombosis of unspecified deep veins of unspecified lower extremity: Secondary | ICD-10-CM | POA: Diagnosis not present

## 2016-12-20 DIAGNOSIS — N186 End stage renal disease: Secondary | ICD-10-CM | POA: Diagnosis not present

## 2016-12-20 DIAGNOSIS — D509 Iron deficiency anemia, unspecified: Secondary | ICD-10-CM | POA: Diagnosis not present

## 2016-12-20 DIAGNOSIS — Z9181 History of falling: Secondary | ICD-10-CM | POA: Diagnosis not present

## 2016-12-20 DIAGNOSIS — N2581 Secondary hyperparathyroidism of renal origin: Secondary | ICD-10-CM | POA: Diagnosis not present

## 2016-12-20 DIAGNOSIS — R262 Difficulty in walking, not elsewhere classified: Secondary | ICD-10-CM | POA: Diagnosis not present

## 2016-12-20 DIAGNOSIS — R609 Edema, unspecified: Secondary | ICD-10-CM | POA: Diagnosis not present

## 2016-12-20 DIAGNOSIS — K9089 Other intestinal malabsorption: Secondary | ICD-10-CM | POA: Diagnosis not present

## 2016-12-20 DIAGNOSIS — M79674 Pain in right toe(s): Secondary | ICD-10-CM | POA: Diagnosis not present

## 2016-12-20 DIAGNOSIS — R42 Dizziness and giddiness: Secondary | ICD-10-CM | POA: Diagnosis not present

## 2016-12-20 DIAGNOSIS — M6281 Muscle weakness (generalized): Secondary | ICD-10-CM | POA: Diagnosis not present

## 2016-12-20 DIAGNOSIS — M810 Age-related osteoporosis without current pathological fracture: Secondary | ICD-10-CM | POA: Diagnosis not present

## 2016-12-20 DIAGNOSIS — R600 Localized edema: Secondary | ICD-10-CM | POA: Diagnosis not present

## 2016-12-20 DIAGNOSIS — K591 Functional diarrhea: Secondary | ICD-10-CM | POA: Diagnosis not present

## 2016-12-20 DIAGNOSIS — I2699 Other pulmonary embolism without acute cor pulmonale: Secondary | ICD-10-CM | POA: Diagnosis not present

## 2016-12-20 DIAGNOSIS — Z48815 Encounter for surgical aftercare following surgery on the digestive system: Secondary | ICD-10-CM | POA: Diagnosis not present

## 2016-12-20 DIAGNOSIS — J449 Chronic obstructive pulmonary disease, unspecified: Secondary | ICD-10-CM | POA: Diagnosis not present

## 2016-12-20 DIAGNOSIS — I739 Peripheral vascular disease, unspecified: Secondary | ICD-10-CM | POA: Diagnosis not present

## 2016-12-20 DIAGNOSIS — Z85038 Personal history of other malignant neoplasm of large intestine: Secondary | ICD-10-CM | POA: Diagnosis not present

## 2016-12-20 DIAGNOSIS — D62 Acute posthemorrhagic anemia: Secondary | ICD-10-CM | POA: Diagnosis not present

## 2016-12-20 DIAGNOSIS — Z7901 Long term (current) use of anticoagulants: Secondary | ICD-10-CM | POA: Diagnosis not present

## 2016-12-20 DIAGNOSIS — Z992 Dependence on renal dialysis: Secondary | ICD-10-CM | POA: Diagnosis not present

## 2016-12-20 DIAGNOSIS — I96 Gangrene, not elsewhere classified: Secondary | ICD-10-CM | POA: Diagnosis not present

## 2016-12-22 DIAGNOSIS — R600 Localized edema: Secondary | ICD-10-CM | POA: Diagnosis not present

## 2016-12-22 DIAGNOSIS — D631 Anemia in chronic kidney disease: Secondary | ICD-10-CM | POA: Diagnosis not present

## 2016-12-22 DIAGNOSIS — N186 End stage renal disease: Secondary | ICD-10-CM | POA: Diagnosis not present

## 2016-12-22 DIAGNOSIS — N2581 Secondary hyperparathyroidism of renal origin: Secondary | ICD-10-CM | POA: Diagnosis not present

## 2016-12-22 DIAGNOSIS — I739 Peripheral vascular disease, unspecified: Secondary | ICD-10-CM | POA: Diagnosis not present

## 2016-12-25 DIAGNOSIS — N2581 Secondary hyperparathyroidism of renal origin: Secondary | ICD-10-CM | POA: Diagnosis not present

## 2016-12-25 DIAGNOSIS — N186 End stage renal disease: Secondary | ICD-10-CM | POA: Diagnosis not present

## 2016-12-25 DIAGNOSIS — D631 Anemia in chronic kidney disease: Secondary | ICD-10-CM | POA: Diagnosis not present

## 2016-12-29 DIAGNOSIS — N2581 Secondary hyperparathyroidism of renal origin: Secondary | ICD-10-CM | POA: Diagnosis not present

## 2016-12-29 DIAGNOSIS — N186 End stage renal disease: Secondary | ICD-10-CM | POA: Diagnosis not present

## 2016-12-29 DIAGNOSIS — D631 Anemia in chronic kidney disease: Secondary | ICD-10-CM | POA: Diagnosis not present

## 2016-12-31 DIAGNOSIS — Z992 Dependence on renal dialysis: Secondary | ICD-10-CM | POA: Diagnosis not present

## 2016-12-31 DIAGNOSIS — N186 End stage renal disease: Secondary | ICD-10-CM | POA: Diagnosis not present

## 2017-01-01 DIAGNOSIS — N2581 Secondary hyperparathyroidism of renal origin: Secondary | ICD-10-CM | POA: Diagnosis not present

## 2017-01-01 DIAGNOSIS — D509 Iron deficiency anemia, unspecified: Secondary | ICD-10-CM | POA: Diagnosis not present

## 2017-01-01 DIAGNOSIS — D631 Anemia in chronic kidney disease: Secondary | ICD-10-CM | POA: Diagnosis not present

## 2017-01-01 DIAGNOSIS — Z23 Encounter for immunization: Secondary | ICD-10-CM | POA: Diagnosis not present

## 2017-01-01 DIAGNOSIS — N186 End stage renal disease: Secondary | ICD-10-CM | POA: Diagnosis not present

## 2017-01-05 DIAGNOSIS — N2581 Secondary hyperparathyroidism of renal origin: Secondary | ICD-10-CM | POA: Diagnosis not present

## 2017-01-05 DIAGNOSIS — D631 Anemia in chronic kidney disease: Secondary | ICD-10-CM | POA: Diagnosis not present

## 2017-01-05 DIAGNOSIS — Z23 Encounter for immunization: Secondary | ICD-10-CM | POA: Diagnosis not present

## 2017-01-05 DIAGNOSIS — D509 Iron deficiency anemia, unspecified: Secondary | ICD-10-CM | POA: Diagnosis not present

## 2017-01-05 DIAGNOSIS — N186 End stage renal disease: Secondary | ICD-10-CM | POA: Diagnosis not present

## 2017-01-08 DIAGNOSIS — Z23 Encounter for immunization: Secondary | ICD-10-CM | POA: Diagnosis not present

## 2017-01-08 DIAGNOSIS — D631 Anemia in chronic kidney disease: Secondary | ICD-10-CM | POA: Diagnosis not present

## 2017-01-08 DIAGNOSIS — D509 Iron deficiency anemia, unspecified: Secondary | ICD-10-CM | POA: Diagnosis not present

## 2017-01-08 DIAGNOSIS — N2581 Secondary hyperparathyroidism of renal origin: Secondary | ICD-10-CM | POA: Diagnosis not present

## 2017-01-08 DIAGNOSIS — N186 End stage renal disease: Secondary | ICD-10-CM | POA: Diagnosis not present

## 2017-01-10 DIAGNOSIS — M79674 Pain in right toe(s): Secondary | ICD-10-CM | POA: Diagnosis not present

## 2017-01-10 DIAGNOSIS — N186 End stage renal disease: Secondary | ICD-10-CM | POA: Diagnosis not present

## 2017-01-10 DIAGNOSIS — R600 Localized edema: Secondary | ICD-10-CM | POA: Diagnosis not present

## 2017-01-10 DIAGNOSIS — I96 Gangrene, not elsewhere classified: Secondary | ICD-10-CM | POA: Diagnosis not present

## 2017-01-10 DIAGNOSIS — I739 Peripheral vascular disease, unspecified: Secondary | ICD-10-CM | POA: Diagnosis not present

## 2017-01-12 DIAGNOSIS — N2581 Secondary hyperparathyroidism of renal origin: Secondary | ICD-10-CM | POA: Diagnosis not present

## 2017-01-12 DIAGNOSIS — N186 End stage renal disease: Secondary | ICD-10-CM | POA: Diagnosis not present

## 2017-01-15 DIAGNOSIS — N2581 Secondary hyperparathyroidism of renal origin: Secondary | ICD-10-CM | POA: Diagnosis not present

## 2017-01-15 DIAGNOSIS — N186 End stage renal disease: Secondary | ICD-10-CM | POA: Diagnosis not present

## 2017-01-17 DIAGNOSIS — R600 Localized edema: Secondary | ICD-10-CM | POA: Diagnosis not present

## 2017-01-19 DIAGNOSIS — N186 End stage renal disease: Secondary | ICD-10-CM | POA: Diagnosis not present

## 2017-01-19 DIAGNOSIS — N2581 Secondary hyperparathyroidism of renal origin: Secondary | ICD-10-CM | POA: Diagnosis not present

## 2017-01-22 DIAGNOSIS — N2581 Secondary hyperparathyroidism of renal origin: Secondary | ICD-10-CM | POA: Diagnosis not present

## 2017-01-22 DIAGNOSIS — D631 Anemia in chronic kidney disease: Secondary | ICD-10-CM | POA: Diagnosis not present

## 2017-01-22 DIAGNOSIS — N186 End stage renal disease: Secondary | ICD-10-CM | POA: Diagnosis not present

## 2017-01-24 DIAGNOSIS — R791 Abnormal coagulation profile: Secondary | ICD-10-CM | POA: Diagnosis not present

## 2017-01-26 DIAGNOSIS — N186 End stage renal disease: Secondary | ICD-10-CM | POA: Diagnosis not present

## 2017-01-26 DIAGNOSIS — I739 Peripheral vascular disease, unspecified: Secondary | ICD-10-CM | POA: Diagnosis not present

## 2017-01-26 DIAGNOSIS — N2581 Secondary hyperparathyroidism of renal origin: Secondary | ICD-10-CM | POA: Diagnosis not present

## 2017-01-26 DIAGNOSIS — D631 Anemia in chronic kidney disease: Secondary | ICD-10-CM | POA: Diagnosis not present

## 2017-01-26 DIAGNOSIS — K9089 Other intestinal malabsorption: Secondary | ICD-10-CM | POA: Diagnosis not present

## 2017-01-28 DIAGNOSIS — I120 Hypertensive chronic kidney disease with stage 5 chronic kidney disease or end stage renal disease: Secondary | ICD-10-CM | POA: Diagnosis not present

## 2017-01-28 DIAGNOSIS — Z86718 Personal history of other venous thrombosis and embolism: Secondary | ICD-10-CM | POA: Diagnosis not present

## 2017-01-28 DIAGNOSIS — Z992 Dependence on renal dialysis: Secondary | ICD-10-CM | POA: Diagnosis not present

## 2017-01-28 DIAGNOSIS — D631 Anemia in chronic kidney disease: Secondary | ICD-10-CM | POA: Diagnosis not present

## 2017-01-28 DIAGNOSIS — Z7901 Long term (current) use of anticoagulants: Secondary | ICD-10-CM | POA: Diagnosis not present

## 2017-01-28 DIAGNOSIS — J449 Chronic obstructive pulmonary disease, unspecified: Secondary | ICD-10-CM | POA: Diagnosis not present

## 2017-01-28 DIAGNOSIS — Z85038 Personal history of other malignant neoplasm of large intestine: Secondary | ICD-10-CM | POA: Diagnosis not present

## 2017-01-28 DIAGNOSIS — N186 End stage renal disease: Secondary | ICD-10-CM | POA: Diagnosis not present

## 2017-01-28 DIAGNOSIS — I739 Peripheral vascular disease, unspecified: Secondary | ICD-10-CM | POA: Diagnosis not present

## 2017-01-28 DIAGNOSIS — I96 Gangrene, not elsewhere classified: Secondary | ICD-10-CM | POA: Diagnosis not present

## 2017-01-28 DIAGNOSIS — Z86711 Personal history of pulmonary embolism: Secondary | ICD-10-CM | POA: Diagnosis not present

## 2017-01-29 ENCOUNTER — Telehealth: Payer: Self-pay

## 2017-01-29 DIAGNOSIS — Z8589 Personal history of malignant neoplasm of other organs and systems: Secondary | ICD-10-CM | POA: Diagnosis not present

## 2017-01-29 DIAGNOSIS — Z882 Allergy status to sulfonamides status: Secondary | ICD-10-CM | POA: Diagnosis not present

## 2017-01-29 DIAGNOSIS — R5383 Other fatigue: Secondary | ICD-10-CM | POA: Diagnosis not present

## 2017-01-29 DIAGNOSIS — R109 Unspecified abdominal pain: Secondary | ICD-10-CM | POA: Diagnosis not present

## 2017-01-29 DIAGNOSIS — N186 End stage renal disease: Secondary | ICD-10-CM | POA: Diagnosis not present

## 2017-01-29 DIAGNOSIS — R404 Transient alteration of awareness: Secondary | ICD-10-CM | POA: Diagnosis not present

## 2017-01-29 DIAGNOSIS — Z888 Allergy status to other drugs, medicaments and biological substances status: Secondary | ICD-10-CM | POA: Diagnosis not present

## 2017-01-29 DIAGNOSIS — Z7901 Long term (current) use of anticoagulants: Secondary | ICD-10-CM | POA: Diagnosis not present

## 2017-01-29 DIAGNOSIS — Z992 Dependence on renal dialysis: Secondary | ICD-10-CM | POA: Diagnosis not present

## 2017-01-29 DIAGNOSIS — Z885 Allergy status to narcotic agent status: Secondary | ICD-10-CM | POA: Diagnosis not present

## 2017-01-29 DIAGNOSIS — E876 Hypokalemia: Secondary | ICD-10-CM | POA: Diagnosis not present

## 2017-01-29 DIAGNOSIS — Z85038 Personal history of other malignant neoplasm of large intestine: Secondary | ICD-10-CM | POA: Diagnosis not present

## 2017-01-29 DIAGNOSIS — R42 Dizziness and giddiness: Secondary | ICD-10-CM | POA: Diagnosis not present

## 2017-01-29 DIAGNOSIS — Z87891 Personal history of nicotine dependence: Secondary | ICD-10-CM | POA: Diagnosis not present

## 2017-01-29 DIAGNOSIS — M85871 Other specified disorders of bone density and structure, right ankle and foot: Secondary | ICD-10-CM | POA: Diagnosis not present

## 2017-01-29 DIAGNOSIS — M7989 Other specified soft tissue disorders: Secondary | ICD-10-CM | POA: Diagnosis not present

## 2017-01-29 DIAGNOSIS — Z79899 Other long term (current) drug therapy: Secondary | ICD-10-CM | POA: Diagnosis not present

## 2017-01-29 DIAGNOSIS — R918 Other nonspecific abnormal finding of lung field: Secondary | ICD-10-CM | POA: Diagnosis not present

## 2017-01-29 DIAGNOSIS — M7731 Calcaneal spur, right foot: Secondary | ICD-10-CM | POA: Diagnosis not present

## 2017-01-29 DIAGNOSIS — R1111 Vomiting without nausea: Secondary | ICD-10-CM | POA: Diagnosis not present

## 2017-01-29 DIAGNOSIS — R112 Nausea with vomiting, unspecified: Secondary | ICD-10-CM | POA: Diagnosis not present

## 2017-01-29 DIAGNOSIS — Z883 Allergy status to other anti-infective agents status: Secondary | ICD-10-CM | POA: Diagnosis not present

## 2017-01-29 DIAGNOSIS — R531 Weakness: Secondary | ICD-10-CM | POA: Diagnosis not present

## 2017-01-29 DIAGNOSIS — K529 Noninfective gastroenteritis and colitis, unspecified: Secondary | ICD-10-CM | POA: Diagnosis not present

## 2017-01-29 DIAGNOSIS — Z86718 Personal history of other venous thrombosis and embolism: Secondary | ICD-10-CM | POA: Diagnosis not present

## 2017-01-29 DIAGNOSIS — I120 Hypertensive chronic kidney disease with stage 5 chronic kidney disease or end stage renal disease: Secondary | ICD-10-CM | POA: Diagnosis not present

## 2017-01-29 DIAGNOSIS — K219 Gastro-esophageal reflux disease without esophagitis: Secondary | ICD-10-CM | POA: Diagnosis not present

## 2017-01-29 DIAGNOSIS — R197 Diarrhea, unspecified: Secondary | ICD-10-CM | POA: Diagnosis not present

## 2017-01-29 DIAGNOSIS — I10 Essential (primary) hypertension: Secondary | ICD-10-CM | POA: Diagnosis not present

## 2017-01-29 NOTE — Telephone Encounter (Signed)
Pt came home from Hospital on Friday, had a good day Saturday, and Sunday, she has had nausea and vomiting.  She has continued with that today as well.  Son thinks that it could be from eating spicy foods.  Today she has gone back to the hospital as she was too week for dialysis.  Gabrielle Stafford , [facility], called and wanted and order for home health 5 days a week for 2 weeks, 4 days for 1 week, and as needed for the last week.  She would just like to resume therapy when she gets out of the hospital, unless there is a major change, then she will call for a different order.  I gave the order for the above.

## 2017-01-30 DIAGNOSIS — K909 Intestinal malabsorption, unspecified: Secondary | ICD-10-CM | POA: Diagnosis not present

## 2017-01-30 DIAGNOSIS — R112 Nausea with vomiting, unspecified: Secondary | ICD-10-CM | POA: Diagnosis not present

## 2017-01-30 DIAGNOSIS — R197 Diarrhea, unspecified: Secondary | ICD-10-CM | POA: Diagnosis not present

## 2017-01-30 DIAGNOSIS — N186 End stage renal disease: Secondary | ICD-10-CM | POA: Diagnosis not present

## 2017-01-30 DIAGNOSIS — E876 Hypokalemia: Secondary | ICD-10-CM | POA: Diagnosis not present

## 2017-01-30 DIAGNOSIS — I10 Essential (primary) hypertension: Secondary | ICD-10-CM | POA: Diagnosis not present

## 2017-01-30 DIAGNOSIS — K219 Gastro-esophageal reflux disease without esophagitis: Secondary | ICD-10-CM | POA: Diagnosis not present

## 2017-01-30 DIAGNOSIS — Z86718 Personal history of other venous thrombosis and embolism: Secondary | ICD-10-CM | POA: Diagnosis not present

## 2017-01-30 NOTE — Telephone Encounter (Signed)
Agree  Thank you

## 2017-01-31 DIAGNOSIS — K909 Intestinal malabsorption, unspecified: Secondary | ICD-10-CM | POA: Diagnosis not present

## 2017-01-31 DIAGNOSIS — N186 End stage renal disease: Secondary | ICD-10-CM | POA: Diagnosis not present

## 2017-01-31 DIAGNOSIS — K219 Gastro-esophageal reflux disease without esophagitis: Secondary | ICD-10-CM | POA: Diagnosis not present

## 2017-01-31 DIAGNOSIS — Z992 Dependence on renal dialysis: Secondary | ICD-10-CM | POA: Diagnosis not present

## 2017-01-31 DIAGNOSIS — E876 Hypokalemia: Secondary | ICD-10-CM | POA: Diagnosis not present

## 2017-01-31 DIAGNOSIS — R112 Nausea with vomiting, unspecified: Secondary | ICD-10-CM | POA: Diagnosis not present

## 2017-01-31 DIAGNOSIS — R197 Diarrhea, unspecified: Secondary | ICD-10-CM | POA: Diagnosis not present

## 2017-01-31 DIAGNOSIS — Z86718 Personal history of other venous thrombosis and embolism: Secondary | ICD-10-CM | POA: Diagnosis not present

## 2017-02-01 DIAGNOSIS — R112 Nausea with vomiting, unspecified: Secondary | ICD-10-CM | POA: Diagnosis not present

## 2017-02-01 DIAGNOSIS — E876 Hypokalemia: Secondary | ICD-10-CM | POA: Diagnosis not present

## 2017-02-01 DIAGNOSIS — R197 Diarrhea, unspecified: Secondary | ICD-10-CM | POA: Diagnosis not present

## 2017-02-01 DIAGNOSIS — N186 End stage renal disease: Secondary | ICD-10-CM | POA: Diagnosis not present

## 2017-02-01 DIAGNOSIS — Z86718 Personal history of other venous thrombosis and embolism: Secondary | ICD-10-CM | POA: Diagnosis not present

## 2017-02-01 DIAGNOSIS — K219 Gastro-esophageal reflux disease without esophagitis: Secondary | ICD-10-CM | POA: Diagnosis not present

## 2017-02-01 DIAGNOSIS — K909 Intestinal malabsorption, unspecified: Secondary | ICD-10-CM | POA: Diagnosis not present

## 2017-02-02 DIAGNOSIS — Z883 Allergy status to other anti-infective agents status: Secondary | ICD-10-CM | POA: Diagnosis not present

## 2017-02-02 DIAGNOSIS — I120 Hypertensive chronic kidney disease with stage 5 chronic kidney disease or end stage renal disease: Secondary | ICD-10-CM | POA: Diagnosis not present

## 2017-02-02 DIAGNOSIS — R5381 Other malaise: Secondary | ICD-10-CM | POA: Diagnosis not present

## 2017-02-02 DIAGNOSIS — I96 Gangrene, not elsewhere classified: Secondary | ICD-10-CM | POA: Diagnosis not present

## 2017-02-02 DIAGNOSIS — Z8589 Personal history of malignant neoplasm of other organs and systems: Secondary | ICD-10-CM | POA: Diagnosis not present

## 2017-02-02 DIAGNOSIS — R600 Localized edema: Secondary | ICD-10-CM | POA: Diagnosis not present

## 2017-02-02 DIAGNOSIS — Z79899 Other long term (current) drug therapy: Secondary | ICD-10-CM | POA: Diagnosis not present

## 2017-02-02 DIAGNOSIS — I129 Hypertensive chronic kidney disease with stage 1 through stage 4 chronic kidney disease, or unspecified chronic kidney disease: Secondary | ICD-10-CM | POA: Diagnosis not present

## 2017-02-02 DIAGNOSIS — K909 Intestinal malabsorption, unspecified: Secondary | ICD-10-CM | POA: Diagnosis not present

## 2017-02-02 DIAGNOSIS — E876 Hypokalemia: Secondary | ICD-10-CM | POA: Diagnosis not present

## 2017-02-02 DIAGNOSIS — R42 Dizziness and giddiness: Secondary | ICD-10-CM | POA: Diagnosis not present

## 2017-02-02 DIAGNOSIS — I82409 Acute embolism and thrombosis of unspecified deep veins of unspecified lower extremity: Secondary | ICD-10-CM | POA: Diagnosis not present

## 2017-02-02 DIAGNOSIS — Z86718 Personal history of other venous thrombosis and embolism: Secondary | ICD-10-CM | POA: Diagnosis not present

## 2017-02-02 DIAGNOSIS — N2581 Secondary hyperparathyroidism of renal origin: Secondary | ICD-10-CM | POA: Diagnosis not present

## 2017-02-02 DIAGNOSIS — K529 Noninfective gastroenteritis and colitis, unspecified: Secondary | ICD-10-CM | POA: Diagnosis not present

## 2017-02-02 DIAGNOSIS — Z87891 Personal history of nicotine dependence: Secondary | ICD-10-CM | POA: Diagnosis not present

## 2017-02-02 DIAGNOSIS — Z885 Allergy status to narcotic agent status: Secondary | ICD-10-CM | POA: Diagnosis not present

## 2017-02-02 DIAGNOSIS — D509 Iron deficiency anemia, unspecified: Secondary | ICD-10-CM | POA: Diagnosis not present

## 2017-02-02 DIAGNOSIS — Z888 Allergy status to other drugs, medicaments and biological substances status: Secondary | ICD-10-CM | POA: Diagnosis not present

## 2017-02-02 DIAGNOSIS — J449 Chronic obstructive pulmonary disease, unspecified: Secondary | ICD-10-CM | POA: Diagnosis not present

## 2017-02-02 DIAGNOSIS — I739 Peripheral vascular disease, unspecified: Secondary | ICD-10-CM | POA: Diagnosis not present

## 2017-02-02 DIAGNOSIS — N186 End stage renal disease: Secondary | ICD-10-CM | POA: Diagnosis not present

## 2017-02-02 DIAGNOSIS — K912 Postsurgical malabsorption, not elsewhere classified: Secondary | ICD-10-CM | POA: Diagnosis not present

## 2017-02-02 DIAGNOSIS — K219 Gastro-esophageal reflux disease without esophagitis: Secondary | ICD-10-CM | POA: Diagnosis not present

## 2017-02-02 DIAGNOSIS — M79674 Pain in right toe(s): Secondary | ICD-10-CM | POA: Diagnosis not present

## 2017-02-02 DIAGNOSIS — R112 Nausea with vomiting, unspecified: Secondary | ICD-10-CM | POA: Diagnosis not present

## 2017-02-02 DIAGNOSIS — R5383 Other fatigue: Secondary | ICD-10-CM | POA: Diagnosis not present

## 2017-02-02 DIAGNOSIS — Z882 Allergy status to sulfonamides status: Secondary | ICD-10-CM | POA: Diagnosis not present

## 2017-02-02 DIAGNOSIS — Z85038 Personal history of other malignant neoplasm of large intestine: Secondary | ICD-10-CM | POA: Diagnosis not present

## 2017-02-02 DIAGNOSIS — Z992 Dependence on renal dialysis: Secondary | ICD-10-CM | POA: Diagnosis not present

## 2017-02-02 DIAGNOSIS — I10 Essential (primary) hypertension: Secondary | ICD-10-CM | POA: Diagnosis not present

## 2017-02-02 DIAGNOSIS — D631 Anemia in chronic kidney disease: Secondary | ICD-10-CM | POA: Diagnosis not present

## 2017-02-02 DIAGNOSIS — Z7901 Long term (current) use of anticoagulants: Secondary | ICD-10-CM | POA: Diagnosis not present

## 2017-02-02 DIAGNOSIS — R531 Weakness: Secondary | ICD-10-CM | POA: Diagnosis not present

## 2017-02-05 DIAGNOSIS — D631 Anemia in chronic kidney disease: Secondary | ICD-10-CM | POA: Diagnosis not present

## 2017-02-05 DIAGNOSIS — N2581 Secondary hyperparathyroidism of renal origin: Secondary | ICD-10-CM | POA: Diagnosis not present

## 2017-02-05 DIAGNOSIS — N186 End stage renal disease: Secondary | ICD-10-CM | POA: Diagnosis not present

## 2017-02-05 DIAGNOSIS — D509 Iron deficiency anemia, unspecified: Secondary | ICD-10-CM | POA: Diagnosis not present

## 2017-02-05 DIAGNOSIS — R5381 Other malaise: Secondary | ICD-10-CM | POA: Diagnosis not present

## 2017-02-05 DIAGNOSIS — I10 Essential (primary) hypertension: Secondary | ICD-10-CM | POA: Diagnosis not present

## 2017-02-06 ENCOUNTER — Telehealth: Payer: Self-pay | Admitting: Family Medicine

## 2017-02-06 DIAGNOSIS — D631 Anemia in chronic kidney disease: Secondary | ICD-10-CM | POA: Diagnosis not present

## 2017-02-06 DIAGNOSIS — I129 Hypertensive chronic kidney disease with stage 1 through stage 4 chronic kidney disease, or unspecified chronic kidney disease: Secondary | ICD-10-CM | POA: Diagnosis not present

## 2017-02-06 DIAGNOSIS — N186 End stage renal disease: Secondary | ICD-10-CM | POA: Diagnosis not present

## 2017-02-06 DIAGNOSIS — K912 Postsurgical malabsorption, not elsewhere classified: Secondary | ICD-10-CM | POA: Diagnosis not present

## 2017-02-06 NOTE — Telephone Encounter (Signed)
Agree with above 

## 2017-02-06 NOTE — Telephone Encounter (Signed)
Received call from [place] with [facility] for verbal orders to resume care and Pt was discharged from the hospital. Requesting:   Nursing eval, PT eval, OT eval, social work eval.  Verbal given.

## 2017-02-08 DIAGNOSIS — I120 Hypertensive chronic kidney disease with stage 5 chronic kidney disease or end stage renal disease: Secondary | ICD-10-CM | POA: Diagnosis not present

## 2017-02-08 DIAGNOSIS — J449 Chronic obstructive pulmonary disease, unspecified: Secondary | ICD-10-CM | POA: Diagnosis not present

## 2017-02-08 DIAGNOSIS — R600 Localized edema: Secondary | ICD-10-CM | POA: Diagnosis not present

## 2017-02-08 DIAGNOSIS — I739 Peripheral vascular disease, unspecified: Secondary | ICD-10-CM | POA: Diagnosis not present

## 2017-02-08 DIAGNOSIS — N186 End stage renal disease: Secondary | ICD-10-CM | POA: Diagnosis not present

## 2017-02-08 DIAGNOSIS — I96 Gangrene, not elsewhere classified: Secondary | ICD-10-CM | POA: Diagnosis not present

## 2017-02-08 DIAGNOSIS — M79674 Pain in right toe(s): Secondary | ICD-10-CM | POA: Diagnosis not present

## 2017-02-09 DIAGNOSIS — D631 Anemia in chronic kidney disease: Secondary | ICD-10-CM | POA: Diagnosis not present

## 2017-02-09 DIAGNOSIS — K219 Gastro-esophageal reflux disease without esophagitis: Secondary | ICD-10-CM | POA: Diagnosis not present

## 2017-02-09 DIAGNOSIS — I129 Hypertensive chronic kidney disease with stage 1 through stage 4 chronic kidney disease, or unspecified chronic kidney disease: Secondary | ICD-10-CM | POA: Diagnosis not present

## 2017-02-09 DIAGNOSIS — N2581 Secondary hyperparathyroidism of renal origin: Secondary | ICD-10-CM | POA: Diagnosis not present

## 2017-02-09 DIAGNOSIS — D509 Iron deficiency anemia, unspecified: Secondary | ICD-10-CM | POA: Diagnosis not present

## 2017-02-09 DIAGNOSIS — R5381 Other malaise: Secondary | ICD-10-CM | POA: Diagnosis not present

## 2017-02-09 DIAGNOSIS — N186 End stage renal disease: Secondary | ICD-10-CM | POA: Diagnosis not present

## 2017-02-12 DIAGNOSIS — N186 End stage renal disease: Secondary | ICD-10-CM | POA: Diagnosis not present

## 2017-02-12 DIAGNOSIS — D509 Iron deficiency anemia, unspecified: Secondary | ICD-10-CM | POA: Diagnosis not present

## 2017-02-12 DIAGNOSIS — N2581 Secondary hyperparathyroidism of renal origin: Secondary | ICD-10-CM | POA: Diagnosis not present

## 2017-02-14 DIAGNOSIS — N186 End stage renal disease: Secondary | ICD-10-CM | POA: Diagnosis not present

## 2017-02-14 DIAGNOSIS — Z86718 Personal history of other venous thrombosis and embolism: Secondary | ICD-10-CM | POA: Diagnosis not present

## 2017-02-14 DIAGNOSIS — D631 Anemia in chronic kidney disease: Secondary | ICD-10-CM | POA: Diagnosis not present

## 2017-02-14 DIAGNOSIS — I96 Gangrene, not elsewhere classified: Secondary | ICD-10-CM | POA: Diagnosis not present

## 2017-02-14 DIAGNOSIS — I120 Hypertensive chronic kidney disease with stage 5 chronic kidney disease or end stage renal disease: Secondary | ICD-10-CM | POA: Diagnosis not present

## 2017-02-14 DIAGNOSIS — Z85038 Personal history of other malignant neoplasm of large intestine: Secondary | ICD-10-CM | POA: Diagnosis not present

## 2017-02-14 DIAGNOSIS — Z992 Dependence on renal dialysis: Secondary | ICD-10-CM | POA: Diagnosis not present

## 2017-02-14 DIAGNOSIS — J449 Chronic obstructive pulmonary disease, unspecified: Secondary | ICD-10-CM | POA: Diagnosis not present

## 2017-02-14 DIAGNOSIS — I739 Peripheral vascular disease, unspecified: Secondary | ICD-10-CM | POA: Diagnosis not present

## 2017-02-14 DIAGNOSIS — Z7901 Long term (current) use of anticoagulants: Secondary | ICD-10-CM | POA: Diagnosis not present

## 2017-02-14 DIAGNOSIS — Z86711 Personal history of pulmonary embolism: Secondary | ICD-10-CM | POA: Diagnosis not present

## 2017-02-16 DIAGNOSIS — N2581 Secondary hyperparathyroidism of renal origin: Secondary | ICD-10-CM | POA: Diagnosis not present

## 2017-02-16 DIAGNOSIS — N186 End stage renal disease: Secondary | ICD-10-CM | POA: Diagnosis not present

## 2017-02-16 DIAGNOSIS — D509 Iron deficiency anemia, unspecified: Secondary | ICD-10-CM | POA: Diagnosis not present

## 2017-02-19 DIAGNOSIS — D631 Anemia in chronic kidney disease: Secondary | ICD-10-CM | POA: Diagnosis not present

## 2017-02-19 DIAGNOSIS — Z992 Dependence on renal dialysis: Secondary | ICD-10-CM | POA: Diagnosis not present

## 2017-02-19 DIAGNOSIS — I96 Gangrene, not elsewhere classified: Secondary | ICD-10-CM | POA: Diagnosis not present

## 2017-02-19 DIAGNOSIS — Z86718 Personal history of other venous thrombosis and embolism: Secondary | ICD-10-CM | POA: Diagnosis not present

## 2017-02-19 DIAGNOSIS — Z85038 Personal history of other malignant neoplasm of large intestine: Secondary | ICD-10-CM | POA: Diagnosis not present

## 2017-02-19 DIAGNOSIS — Z86711 Personal history of pulmonary embolism: Secondary | ICD-10-CM | POA: Diagnosis not present

## 2017-02-19 DIAGNOSIS — I739 Peripheral vascular disease, unspecified: Secondary | ICD-10-CM | POA: Diagnosis not present

## 2017-02-19 DIAGNOSIS — J449 Chronic obstructive pulmonary disease, unspecified: Secondary | ICD-10-CM | POA: Diagnosis not present

## 2017-02-19 DIAGNOSIS — I120 Hypertensive chronic kidney disease with stage 5 chronic kidney disease or end stage renal disease: Secondary | ICD-10-CM | POA: Diagnosis not present

## 2017-02-19 DIAGNOSIS — Z7901 Long term (current) use of anticoagulants: Secondary | ICD-10-CM | POA: Diagnosis not present

## 2017-02-19 DIAGNOSIS — N186 End stage renal disease: Secondary | ICD-10-CM | POA: Diagnosis not present

## 2017-02-20 ENCOUNTER — Ambulatory Visit: Payer: Medicare Other | Admitting: Family Medicine

## 2017-02-20 DIAGNOSIS — N2581 Secondary hyperparathyroidism of renal origin: Secondary | ICD-10-CM | POA: Diagnosis not present

## 2017-02-20 DIAGNOSIS — N186 End stage renal disease: Secondary | ICD-10-CM | POA: Diagnosis not present

## 2017-02-20 DIAGNOSIS — D509 Iron deficiency anemia, unspecified: Secondary | ICD-10-CM | POA: Diagnosis not present

## 2017-02-20 DIAGNOSIS — I82409 Acute embolism and thrombosis of unspecified deep veins of unspecified lower extremity: Secondary | ICD-10-CM | POA: Diagnosis not present

## 2017-02-20 LAB — POCT INR: INR: 10

## 2017-02-21 ENCOUNTER — Other Ambulatory Visit: Payer: Self-pay | Admitting: Family Medicine

## 2017-02-21 ENCOUNTER — Telehealth: Payer: Self-pay | Admitting: Family Medicine

## 2017-02-21 ENCOUNTER — Ambulatory Visit (INDEPENDENT_AMBULATORY_CARE_PROVIDER_SITE_OTHER): Payer: Medicare Other | Admitting: Family Medicine

## 2017-02-21 ENCOUNTER — Encounter: Payer: Self-pay | Admitting: Family Medicine

## 2017-02-21 VITALS — BP 116/63 | HR 80 | Temp 97.5°F | Ht 60.0 in | Wt 89.0 lb

## 2017-02-21 DIAGNOSIS — N186 End stage renal disease: Secondary | ICD-10-CM | POA: Diagnosis not present

## 2017-02-21 DIAGNOSIS — Z86711 Personal history of pulmonary embolism: Secondary | ICD-10-CM

## 2017-02-21 DIAGNOSIS — R195 Other fecal abnormalities: Secondary | ICD-10-CM | POA: Diagnosis not present

## 2017-02-21 DIAGNOSIS — K529 Noninfective gastroenteritis and colitis, unspecified: Secondary | ICD-10-CM | POA: Diagnosis not present

## 2017-02-21 DIAGNOSIS — Z7901 Long term (current) use of anticoagulants: Secondary | ICD-10-CM | POA: Diagnosis not present

## 2017-02-21 DIAGNOSIS — I739 Peripheral vascular disease, unspecified: Secondary | ICD-10-CM | POA: Diagnosis not present

## 2017-02-21 DIAGNOSIS — D631 Anemia in chronic kidney disease: Secondary | ICD-10-CM | POA: Diagnosis not present

## 2017-02-21 DIAGNOSIS — J449 Chronic obstructive pulmonary disease, unspecified: Secondary | ICD-10-CM | POA: Diagnosis not present

## 2017-02-21 DIAGNOSIS — I120 Hypertensive chronic kidney disease with stage 5 chronic kidney disease or end stage renal disease: Secondary | ICD-10-CM | POA: Diagnosis not present

## 2017-02-21 DIAGNOSIS — D510 Vitamin B12 deficiency anemia due to intrinsic factor deficiency: Secondary | ICD-10-CM

## 2017-02-21 DIAGNOSIS — I96 Gangrene, not elsewhere classified: Secondary | ICD-10-CM | POA: Diagnosis not present

## 2017-02-21 DIAGNOSIS — Z86718 Personal history of other venous thrombosis and embolism: Secondary | ICD-10-CM | POA: Diagnosis not present

## 2017-02-21 DIAGNOSIS — Z85038 Personal history of other malignant neoplasm of large intestine: Secondary | ICD-10-CM | POA: Diagnosis not present

## 2017-02-21 DIAGNOSIS — Z992 Dependence on renal dialysis: Secondary | ICD-10-CM | POA: Diagnosis not present

## 2017-02-21 LAB — CBC
HCT: 39.8 % (ref 35.0–45.0)
Hemoglobin: 12.4 g/dL (ref 11.7–15.5)
MCH: 29.5 pg (ref 27.0–33.0)
MCHC: 31.2 g/dL — ABNORMAL LOW (ref 32.0–36.0)
MCV: 94.8 fL (ref 80.0–100.0)
MPV: 12.1 fL (ref 7.5–12.5)
PLATELETS: 186 10*3/uL (ref 140–400)
RBC: 4.2 10*6/uL (ref 3.80–5.10)
RDW: 15.1 % — AB (ref 11.0–15.0)
WBC: 8.7 10*3/uL (ref 3.8–10.8)

## 2017-02-21 LAB — POC HEMOCCULT BLD/STL (OFFICE/1-CARD/DIAGNOSTIC)
FECAL OCCULT BLD: POSITIVE — AB
OCCULT BLOOD DATE: 11212018

## 2017-02-21 LAB — PROTIME-INR

## 2017-02-21 MED ORDER — VITAMIN K1 10 MG/ML IJ SOLN
1.0000 mg | Freq: Once | INTRAMUSCULAR | Status: AC
  Administered 2017-02-21: 1 mg via SUBCUTANEOUS

## 2017-02-21 MED ORDER — CYANOCOBALAMIN 1000 MCG/ML IJ SOLN
1000.0000 ug | Freq: Once | INTRAMUSCULAR | Status: AC
  Administered 2017-02-21: 1000 ug via INTRAMUSCULAR

## 2017-02-21 NOTE — Telephone Encounter (Signed)
I received a call from the nurse team. The INR that was ordered today was unable to be completed.  I discussed the situation with the ordering provider Dr Madilyn Fireman who is aware and is actively managing.

## 2017-02-21 NOTE — Addendum Note (Signed)
Addended by: Teddy Spike on: 02/21/2017 04:06 PM   Modules accepted: Orders

## 2017-02-21 NOTE — Patient Instructions (Signed)
If the INR comes back very high or the hemoglobin is very low we will call you to go to the ED.   Stop our coumadin until Monday.  We can recheck INR on Monday or can be done at dialysis.   If getting dizzy, weak, short of breath or bleeding gets worse go to the ED Immediately.

## 2017-02-21 NOTE — Progress Notes (Signed)
Pt given b12 injection today. Injection given in LD she tolerated well.Gabrielle Stafford

## 2017-02-21 NOTE — Progress Notes (Addendum)
Subjective:    Patient ID: Gabrielle Stafford, female    DOB: [birth year], 83 y.o.   MRN: [id number]  HPI 83 year old female on dialysis for end-stage renal failure comes in today after a home health provider called and said that she was having some black stools.  She was actually admitted to the hospital September 7 for a GI bleed.  At that time her Coumadin level was supratherapeutic and they felt like this was the most likely cause.  On endoscopy this also may be malformations.  He had black stools for about 4 days prior to that.  Last hemoglobin was 11.6 on October 31. Noticed dark stools yesterday. She is on doxycycline and on pantoprazole.  She was actually admitted again to the hospital on October 29 and discharged home on November 2 for nausea and vomiting that was intractable at the time.  She denies any abdominal pain, nausea or cramping.  She has had a good appetite.  She does have some intermittent dizziness but says this is been going on for a long time and she just has to take her time when she changes positions.  She just had dialysis yesterday.  Normally she goes on Mondays and Fridays but this week she went on Tuesday because of the holiday.  She denies any fevers chills or sweats.   Her granddaughter Baxter Flattery is here with her, who is a Marine scientist.  Her granddaughter also notes that she has not taken her potassium for the last 2 days.  She had a hard time swallowing it.  They have spoken with the physician and they are ordering smaller tabs of 10 mEq that she can take 2 instead of taking a single 20 mEq.  She says taking the liquid it makes her gag and vomit.  Vitamin B 12 deficiency-she is actually on the schedule next week for B12 shot and wants to know if she could go ahead and get that done today.  He says she has not had an injection in a couple of months.  Review of Systems  BP 116/63   Pulse 80   Temp (!) 97.5 F (36.4 C)   Ht 5' (1.524 m)   Wt 89 lb (40.4 kg)   BMI 17.38 kg/m      Allergies  Allergen Reactions  . Metoprolol Anaphylaxis  . Furosemide     Other reaction(s): Dizziness  . Alendronate Sodium   . Amoxicillin Diarrhea  . Azithromycin   . Ciprofloxacin   . Diltiazem Hcl Er     diarrhea  . Nitrofurantoin   . Sulfamethoxazole-Trimethoprim   . Tramadol Hcl     REACTION: nausea  . Keflex [Cephalexin] Itching and Rash    Past Medical History:  Diagnosis Date  . Arthritis   . Cancer ([place])   . Cardiac arrest ([place])   . DVT (deep venous thrombosis) ([place])   . Hip fracture ([facility])    Left  . Hypokalemia   . PE (pulmonary thromboembolism) ([place])   . Short gut syndrome   . Stented coronary artery     Past Surgical History:  Procedure Laterality Date  . CATARACT EXTRACTION, BILATERAL  1999  . CHOLECYSTECTOMY  1999  . COLON SURGERY  1999   colon cancer    Social History   Socioeconomic History  . Marital status: Widowed    Spouse name: Not on file  . Number of children: 1  . Years of education: Not on file  . Highest education level: Not  on file  Social Needs  . Financial resource strain: Not on file  . Food insecurity - worry: Not on file  . Food insecurity - inability: Not on file  . Transportation needs - medical: Not on file  . Transportation needs - non-medical: Not on file  Occupational History  . Occupation: retired    Fish farm manager: RETIRED  Tobacco Use  . Smoking status: Former Smoker    Types: Cigarettes    Last attempt to quit: 04/03/1997    Years since quitting: 19.9  . Smokeless tobacco: Never Used  Substance and Sexual Activity  . Alcohol use: No  . Drug use: No  . Sexual activity: Not on file    Comment: portrait artist, retired, assoc degree, married, regularly exercises  Other Topics Concern  . Not on file  Social History Narrative   Widowed. Husband died in September 28, 2010.  No regular exercise.      Family History  Problem Relation Age of Onset  . Alcohol abuse Father   . Gout Father   . Heart attack Father         brothers  . Diabetes Father   . Hypertension Father   . Cancer Father        bladder tumor  . Colon cancer Mother        colon  . Pulmonary embolism Mother        leg amputation  . Depression Mother   . Hyperlipidemia Mother   . Diabetes Sister   . Breast cancer Sister   . Arthritis Unknown        aunt    Outpatient Encounter Medications as of 02/21/2017  Medication Sig  . pantoprazole (PROTONIX) 40 MG tablet Take 40 mg by mouth 2 (two) times daily.  . potassium chloride SA (K-DUR,KLOR-CON) 20 MEQ tablet Take 1 tablet by mouth daily.  . Acetaminophen 650 MG TABS Take 1 tablet by mouth as needed.  . calcium carbonate (TUMS - DOSED IN MG ELEMENTAL CALCIUM) 500 MG chewable tablet Chew 2-3 tablets by mouth 3 (three) times daily after meals.   . cholecalciferol (VITAMIN D) 1000 UNITS tablet Take 1,000 Units by mouth.  . cholestyramine light (PREVALITE) 4 g packet Take 1 packet by mouth 2 (two) times daily.  . cyanocobalamin (,VITAMIN B-12,) 1000 MCG/ML injection Inject 1,000 mcg into the muscle every 30 (thirty) days.  . folic acid-vitamin b complex-vitamin c-selenium-zinc (DIALYVITE) 3 MG TABS tablet Take 1 tablet by mouth.  . magnesium gluconate (MAGONATE) 500 MG tablet Take 250 mg by mouth 2 (two) times daily.  Marland Kitchen warfarin (COUMADIN) 2 MG tablet TAKE 1 TABLET DAILY. (Patient taking differently: TAKE 5mg   TABLET DAILY.)   No facility-administered encounter medications on file as of 02/21/2017.          Objective:   Physical Exam  Constitutional: She is oriented to person, place, and time. She appears well-developed and well-nourished.  HENT:  Head: Normocephalic and atraumatic.  Cardiovascular: Normal rate, regular rhythm and normal heart sounds.  Pulmonary/Chest: Effort normal and breath sounds normal.  Genitourinary: Rectal exam shows guaiac positive stool.  Genitourinary Comments: Normal rectal tone.  She does have some dry patches on her skin and buttock area.  Guaiac was  positive.  Stool appears to be dark.  No bright red blood.  Musculoskeletal:  1+ pitting edema of the bilat lower ext around ankles.   Neurological: She is alert and oriented to person, place, and time.  Skin: Skin is warm and dry.  Psychiatric: She has a normal mood and affect. Her behavior is normal.        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  Her point-of-care tool read error 7 message which means that it is too high to be read.  Patient sent for stat INR and CBC.  Right now she is stable with vitals.  She is asymptomatic.  Given 2 mg of vitamin K subcutaneous injection.  Will call with results as soon as they come back stat.  Daughter-in-law is staying with her for the next several days who is a Marine scientist.  Will stop her Coumadin completely until Monday.  Reviewed all burning signs with granddaughter in regards to when to go to the emergency department.  If we can keep her stable and stop the bleeding and keep her out of the hospital that would be the best option.  Pernicious anemia secondary to short gut syndrome-B12 given today.  Lab Results  Component Value Date   [id number] 11/29/2016   Time spent 60 min, > 50% counseling and coordinating care for black stools, anemia, etc  Addendum-after patient left we were able to get in touch with Dr. Jaquita Rector office at at [facility].  They actually did draw an INR yesterday on her during dialysis and her INR was greater than 10.  And her PT was 123.

## 2017-02-21 NOTE — Addendum Note (Signed)
Addended by: Beatrice Lecher D on: 02/21/2017 03:28 PM   Modules accepted: Level of Service

## 2017-02-23 DIAGNOSIS — N186 End stage renal disease: Secondary | ICD-10-CM | POA: Diagnosis not present

## 2017-02-23 DIAGNOSIS — N2581 Secondary hyperparathyroidism of renal origin: Secondary | ICD-10-CM | POA: Diagnosis not present

## 2017-02-24 DIAGNOSIS — J449 Chronic obstructive pulmonary disease, unspecified: Secondary | ICD-10-CM | POA: Diagnosis not present

## 2017-02-24 DIAGNOSIS — Z992 Dependence on renal dialysis: Secondary | ICD-10-CM | POA: Diagnosis not present

## 2017-02-24 DIAGNOSIS — Z7901 Long term (current) use of anticoagulants: Secondary | ICD-10-CM | POA: Diagnosis not present

## 2017-02-24 DIAGNOSIS — I739 Peripheral vascular disease, unspecified: Secondary | ICD-10-CM | POA: Diagnosis not present

## 2017-02-24 DIAGNOSIS — Z85038 Personal history of other malignant neoplasm of large intestine: Secondary | ICD-10-CM | POA: Diagnosis not present

## 2017-02-24 DIAGNOSIS — Z86718 Personal history of other venous thrombosis and embolism: Secondary | ICD-10-CM | POA: Diagnosis not present

## 2017-02-24 DIAGNOSIS — N186 End stage renal disease: Secondary | ICD-10-CM | POA: Diagnosis not present

## 2017-02-24 DIAGNOSIS — I96 Gangrene, not elsewhere classified: Secondary | ICD-10-CM | POA: Diagnosis not present

## 2017-02-24 DIAGNOSIS — I120 Hypertensive chronic kidney disease with stage 5 chronic kidney disease or end stage renal disease: Secondary | ICD-10-CM | POA: Diagnosis not present

## 2017-02-24 DIAGNOSIS — D631 Anemia in chronic kidney disease: Secondary | ICD-10-CM | POA: Diagnosis not present

## 2017-02-24 DIAGNOSIS — Z86711 Personal history of pulmonary embolism: Secondary | ICD-10-CM | POA: Diagnosis not present

## 2017-02-26 DIAGNOSIS — I82409 Acute embolism and thrombosis of unspecified deep veins of unspecified lower extremity: Secondary | ICD-10-CM | POA: Diagnosis not present

## 2017-02-26 DIAGNOSIS — N186 End stage renal disease: Secondary | ICD-10-CM | POA: Diagnosis not present

## 2017-02-26 DIAGNOSIS — N2581 Secondary hyperparathyroidism of renal origin: Secondary | ICD-10-CM | POA: Diagnosis not present

## 2017-02-27 ENCOUNTER — Ambulatory Visit: Payer: Medicare Other | Admitting: Family Medicine

## 2017-02-27 DIAGNOSIS — Z86711 Personal history of pulmonary embolism: Secondary | ICD-10-CM | POA: Diagnosis not present

## 2017-02-27 DIAGNOSIS — Z86718 Personal history of other venous thrombosis and embolism: Secondary | ICD-10-CM | POA: Diagnosis not present

## 2017-02-27 DIAGNOSIS — J449 Chronic obstructive pulmonary disease, unspecified: Secondary | ICD-10-CM | POA: Diagnosis not present

## 2017-02-27 DIAGNOSIS — Z85038 Personal history of other malignant neoplasm of large intestine: Secondary | ICD-10-CM | POA: Diagnosis not present

## 2017-02-27 DIAGNOSIS — I739 Peripheral vascular disease, unspecified: Secondary | ICD-10-CM | POA: Diagnosis not present

## 2017-02-27 DIAGNOSIS — I96 Gangrene, not elsewhere classified: Secondary | ICD-10-CM | POA: Diagnosis not present

## 2017-02-27 DIAGNOSIS — I120 Hypertensive chronic kidney disease with stage 5 chronic kidney disease or end stage renal disease: Secondary | ICD-10-CM | POA: Diagnosis not present

## 2017-02-27 DIAGNOSIS — D631 Anemia in chronic kidney disease: Secondary | ICD-10-CM | POA: Diagnosis not present

## 2017-02-27 DIAGNOSIS — Z992 Dependence on renal dialysis: Secondary | ICD-10-CM | POA: Diagnosis not present

## 2017-02-27 DIAGNOSIS — N186 End stage renal disease: Secondary | ICD-10-CM | POA: Diagnosis not present

## 2017-02-27 DIAGNOSIS — Z7901 Long term (current) use of anticoagulants: Secondary | ICD-10-CM | POA: Diagnosis not present

## 2017-03-01 DIAGNOSIS — Z85038 Personal history of other malignant neoplasm of large intestine: Secondary | ICD-10-CM | POA: Diagnosis not present

## 2017-03-01 DIAGNOSIS — Z86718 Personal history of other venous thrombosis and embolism: Secondary | ICD-10-CM | POA: Diagnosis not present

## 2017-03-01 DIAGNOSIS — J449 Chronic obstructive pulmonary disease, unspecified: Secondary | ICD-10-CM | POA: Diagnosis not present

## 2017-03-01 DIAGNOSIS — Z7901 Long term (current) use of anticoagulants: Secondary | ICD-10-CM | POA: Diagnosis not present

## 2017-03-01 DIAGNOSIS — I96 Gangrene, not elsewhere classified: Secondary | ICD-10-CM | POA: Diagnosis not present

## 2017-03-01 DIAGNOSIS — N186 End stage renal disease: Secondary | ICD-10-CM | POA: Diagnosis not present

## 2017-03-01 DIAGNOSIS — I120 Hypertensive chronic kidney disease with stage 5 chronic kidney disease or end stage renal disease: Secondary | ICD-10-CM | POA: Diagnosis not present

## 2017-03-01 DIAGNOSIS — Z86711 Personal history of pulmonary embolism: Secondary | ICD-10-CM | POA: Diagnosis not present

## 2017-03-01 DIAGNOSIS — Z992 Dependence on renal dialysis: Secondary | ICD-10-CM | POA: Diagnosis not present

## 2017-03-01 DIAGNOSIS — D631 Anemia in chronic kidney disease: Secondary | ICD-10-CM | POA: Diagnosis not present

## 2017-03-01 DIAGNOSIS — I739 Peripheral vascular disease, unspecified: Secondary | ICD-10-CM | POA: Diagnosis not present

## 2017-03-02 DIAGNOSIS — N186 End stage renal disease: Secondary | ICD-10-CM | POA: Diagnosis not present

## 2017-03-02 DIAGNOSIS — N2581 Secondary hyperparathyroidism of renal origin: Secondary | ICD-10-CM | POA: Diagnosis not present

## 2017-03-02 DIAGNOSIS — Z992 Dependence on renal dialysis: Secondary | ICD-10-CM | POA: Diagnosis not present

## 2017-03-05 DIAGNOSIS — N2581 Secondary hyperparathyroidism of renal origin: Secondary | ICD-10-CM | POA: Diagnosis not present

## 2017-03-05 DIAGNOSIS — N186 End stage renal disease: Secondary | ICD-10-CM | POA: Diagnosis not present

## 2017-03-05 DIAGNOSIS — D509 Iron deficiency anemia, unspecified: Secondary | ICD-10-CM | POA: Diagnosis not present

## 2017-03-06 DIAGNOSIS — Z85038 Personal history of other malignant neoplasm of large intestine: Secondary | ICD-10-CM | POA: Diagnosis not present

## 2017-03-06 DIAGNOSIS — J449 Chronic obstructive pulmonary disease, unspecified: Secondary | ICD-10-CM | POA: Diagnosis not present

## 2017-03-06 DIAGNOSIS — N186 End stage renal disease: Secondary | ICD-10-CM | POA: Diagnosis not present

## 2017-03-06 DIAGNOSIS — Z86711 Personal history of pulmonary embolism: Secondary | ICD-10-CM | POA: Diagnosis not present

## 2017-03-06 DIAGNOSIS — I120 Hypertensive chronic kidney disease with stage 5 chronic kidney disease or end stage renal disease: Secondary | ICD-10-CM | POA: Diagnosis not present

## 2017-03-06 DIAGNOSIS — D631 Anemia in chronic kidney disease: Secondary | ICD-10-CM | POA: Diagnosis not present

## 2017-03-06 DIAGNOSIS — Z992 Dependence on renal dialysis: Secondary | ICD-10-CM | POA: Diagnosis not present

## 2017-03-06 DIAGNOSIS — Z7901 Long term (current) use of anticoagulants: Secondary | ICD-10-CM | POA: Diagnosis not present

## 2017-03-06 DIAGNOSIS — I96 Gangrene, not elsewhere classified: Secondary | ICD-10-CM | POA: Diagnosis not present

## 2017-03-06 DIAGNOSIS — Z86718 Personal history of other venous thrombosis and embolism: Secondary | ICD-10-CM | POA: Diagnosis not present

## 2017-03-06 DIAGNOSIS — I739 Peripheral vascular disease, unspecified: Secondary | ICD-10-CM | POA: Diagnosis not present

## 2017-03-08 DIAGNOSIS — Z86718 Personal history of other venous thrombosis and embolism: Secondary | ICD-10-CM | POA: Diagnosis not present

## 2017-03-08 DIAGNOSIS — D631 Anemia in chronic kidney disease: Secondary | ICD-10-CM | POA: Diagnosis not present

## 2017-03-08 DIAGNOSIS — Z992 Dependence on renal dialysis: Secondary | ICD-10-CM | POA: Diagnosis not present

## 2017-03-08 DIAGNOSIS — Z86711 Personal history of pulmonary embolism: Secondary | ICD-10-CM | POA: Diagnosis not present

## 2017-03-08 DIAGNOSIS — Z7901 Long term (current) use of anticoagulants: Secondary | ICD-10-CM | POA: Diagnosis not present

## 2017-03-08 DIAGNOSIS — Z85038 Personal history of other malignant neoplasm of large intestine: Secondary | ICD-10-CM | POA: Diagnosis not present

## 2017-03-08 DIAGNOSIS — J449 Chronic obstructive pulmonary disease, unspecified: Secondary | ICD-10-CM | POA: Diagnosis not present

## 2017-03-08 DIAGNOSIS — I739 Peripheral vascular disease, unspecified: Secondary | ICD-10-CM | POA: Diagnosis not present

## 2017-03-08 DIAGNOSIS — I96 Gangrene, not elsewhere classified: Secondary | ICD-10-CM | POA: Diagnosis not present

## 2017-03-08 DIAGNOSIS — I120 Hypertensive chronic kidney disease with stage 5 chronic kidney disease or end stage renal disease: Secondary | ICD-10-CM | POA: Diagnosis not present

## 2017-03-08 DIAGNOSIS — N186 End stage renal disease: Secondary | ICD-10-CM | POA: Diagnosis not present

## 2017-03-09 DIAGNOSIS — N2581 Secondary hyperparathyroidism of renal origin: Secondary | ICD-10-CM | POA: Diagnosis not present

## 2017-03-09 DIAGNOSIS — N186 End stage renal disease: Secondary | ICD-10-CM | POA: Diagnosis not present

## 2017-03-09 DIAGNOSIS — D509 Iron deficiency anemia, unspecified: Secondary | ICD-10-CM | POA: Diagnosis not present

## 2017-03-14 DIAGNOSIS — Z7901 Long term (current) use of anticoagulants: Secondary | ICD-10-CM | POA: Diagnosis not present

## 2017-03-14 DIAGNOSIS — Z86711 Personal history of pulmonary embolism: Secondary | ICD-10-CM | POA: Diagnosis not present

## 2017-03-14 DIAGNOSIS — Z992 Dependence on renal dialysis: Secondary | ICD-10-CM | POA: Diagnosis not present

## 2017-03-14 DIAGNOSIS — N186 End stage renal disease: Secondary | ICD-10-CM | POA: Diagnosis not present

## 2017-03-14 DIAGNOSIS — I120 Hypertensive chronic kidney disease with stage 5 chronic kidney disease or end stage renal disease: Secondary | ICD-10-CM | POA: Diagnosis not present

## 2017-03-14 DIAGNOSIS — Z86718 Personal history of other venous thrombosis and embolism: Secondary | ICD-10-CM | POA: Diagnosis not present

## 2017-03-14 DIAGNOSIS — Z85038 Personal history of other malignant neoplasm of large intestine: Secondary | ICD-10-CM | POA: Diagnosis not present

## 2017-03-14 DIAGNOSIS — J449 Chronic obstructive pulmonary disease, unspecified: Secondary | ICD-10-CM | POA: Diagnosis not present

## 2017-03-14 DIAGNOSIS — I739 Peripheral vascular disease, unspecified: Secondary | ICD-10-CM | POA: Diagnosis not present

## 2017-03-14 DIAGNOSIS — D631 Anemia in chronic kidney disease: Secondary | ICD-10-CM | POA: Diagnosis not present

## 2017-03-14 DIAGNOSIS — I96 Gangrene, not elsewhere classified: Secondary | ICD-10-CM | POA: Diagnosis not present

## 2017-03-15 DIAGNOSIS — Z86711 Personal history of pulmonary embolism: Secondary | ICD-10-CM | POA: Diagnosis not present

## 2017-03-15 DIAGNOSIS — I120 Hypertensive chronic kidney disease with stage 5 chronic kidney disease or end stage renal disease: Secondary | ICD-10-CM | POA: Diagnosis not present

## 2017-03-15 DIAGNOSIS — D631 Anemia in chronic kidney disease: Secondary | ICD-10-CM | POA: Diagnosis not present

## 2017-03-15 DIAGNOSIS — J449 Chronic obstructive pulmonary disease, unspecified: Secondary | ICD-10-CM | POA: Diagnosis not present

## 2017-03-15 DIAGNOSIS — I739 Peripheral vascular disease, unspecified: Secondary | ICD-10-CM | POA: Diagnosis not present

## 2017-03-15 DIAGNOSIS — Z86718 Personal history of other venous thrombosis and embolism: Secondary | ICD-10-CM | POA: Diagnosis not present

## 2017-03-15 DIAGNOSIS — Z7901 Long term (current) use of anticoagulants: Secondary | ICD-10-CM | POA: Diagnosis not present

## 2017-03-15 DIAGNOSIS — Z85038 Personal history of other malignant neoplasm of large intestine: Secondary | ICD-10-CM | POA: Diagnosis not present

## 2017-03-15 DIAGNOSIS — Z992 Dependence on renal dialysis: Secondary | ICD-10-CM | POA: Diagnosis not present

## 2017-03-15 DIAGNOSIS — I96 Gangrene, not elsewhere classified: Secondary | ICD-10-CM | POA: Diagnosis not present

## 2017-03-15 DIAGNOSIS — N186 End stage renal disease: Secondary | ICD-10-CM | POA: Diagnosis not present

## 2017-03-16 DIAGNOSIS — R188 Other ascites: Secondary | ICD-10-CM | POA: Diagnosis not present

## 2017-03-16 DIAGNOSIS — I4891 Unspecified atrial fibrillation: Secondary | ICD-10-CM | POA: Diagnosis not present

## 2017-03-16 DIAGNOSIS — I480 Paroxysmal atrial fibrillation: Secondary | ICD-10-CM | POA: Diagnosis not present

## 2017-03-16 DIAGNOSIS — R404 Transient alteration of awareness: Secondary | ICD-10-CM | POA: Diagnosis not present

## 2017-03-16 DIAGNOSIS — Z992 Dependence on renal dialysis: Secondary | ICD-10-CM | POA: Diagnosis not present

## 2017-03-16 DIAGNOSIS — I120 Hypertensive chronic kidney disease with stage 5 chronic kidney disease or end stage renal disease: Secondary | ICD-10-CM | POA: Diagnosis not present

## 2017-03-16 DIAGNOSIS — K912 Postsurgical malabsorption, not elsewhere classified: Secondary | ICD-10-CM | POA: Diagnosis not present

## 2017-03-16 DIAGNOSIS — E876 Hypokalemia: Secondary | ICD-10-CM | POA: Diagnosis not present

## 2017-03-16 DIAGNOSIS — R42 Dizziness and giddiness: Secondary | ICD-10-CM | POA: Diagnosis not present

## 2017-03-16 DIAGNOSIS — Z888 Allergy status to other drugs, medicaments and biological substances status: Secondary | ICD-10-CM | POA: Diagnosis not present

## 2017-03-16 DIAGNOSIS — Z7901 Long term (current) use of anticoagulants: Secondary | ICD-10-CM | POA: Diagnosis not present

## 2017-03-16 DIAGNOSIS — Z886 Allergy status to analgesic agent status: Secondary | ICD-10-CM | POA: Diagnosis not present

## 2017-03-16 DIAGNOSIS — Z883 Allergy status to other anti-infective agents status: Secondary | ICD-10-CM | POA: Diagnosis not present

## 2017-03-16 DIAGNOSIS — N186 End stage renal disease: Secondary | ICD-10-CM | POA: Diagnosis not present

## 2017-03-16 DIAGNOSIS — R0602 Shortness of breath: Secondary | ICD-10-CM | POA: Diagnosis not present

## 2017-03-16 DIAGNOSIS — J9811 Atelectasis: Secondary | ICD-10-CM | POA: Diagnosis not present

## 2017-03-16 DIAGNOSIS — R918 Other nonspecific abnormal finding of lung field: Secondary | ICD-10-CM | POA: Diagnosis not present

## 2017-03-16 DIAGNOSIS — N2581 Secondary hyperparathyroidism of renal origin: Secondary | ICD-10-CM | POA: Diagnosis not present

## 2017-03-16 DIAGNOSIS — R531 Weakness: Secondary | ICD-10-CM | POA: Diagnosis not present

## 2017-03-16 DIAGNOSIS — Z881 Allergy status to other antibiotic agents status: Secondary | ICD-10-CM | POA: Diagnosis not present

## 2017-03-16 DIAGNOSIS — C189 Malignant neoplasm of colon, unspecified: Secondary | ICD-10-CM | POA: Diagnosis not present

## 2017-03-16 DIAGNOSIS — J90 Pleural effusion, not elsewhere classified: Secondary | ICD-10-CM | POA: Diagnosis not present

## 2017-03-16 DIAGNOSIS — K529 Noninfective gastroenteritis and colitis, unspecified: Secondary | ICD-10-CM | POA: Diagnosis not present

## 2017-03-16 DIAGNOSIS — K219 Gastro-esophageal reflux disease without esophagitis: Secondary | ICD-10-CM | POA: Diagnosis not present

## 2017-03-16 DIAGNOSIS — D631 Anemia in chronic kidney disease: Secondary | ICD-10-CM | POA: Diagnosis not present

## 2017-03-16 DIAGNOSIS — Z79899 Other long term (current) drug therapy: Secondary | ICD-10-CM | POA: Diagnosis not present

## 2017-03-16 DIAGNOSIS — Z87891 Personal history of nicotine dependence: Secondary | ICD-10-CM | POA: Diagnosis not present

## 2017-03-16 DIAGNOSIS — I739 Peripheral vascular disease, unspecified: Secondary | ICD-10-CM | POA: Diagnosis not present

## 2017-03-17 DIAGNOSIS — N186 End stage renal disease: Secondary | ICD-10-CM | POA: Diagnosis not present

## 2017-03-17 DIAGNOSIS — I4891 Unspecified atrial fibrillation: Secondary | ICD-10-CM | POA: Diagnosis not present

## 2017-03-17 DIAGNOSIS — J90 Pleural effusion, not elsewhere classified: Secondary | ICD-10-CM | POA: Diagnosis not present

## 2017-03-17 DIAGNOSIS — K909 Intestinal malabsorption, unspecified: Secondary | ICD-10-CM | POA: Diagnosis not present

## 2017-03-17 DIAGNOSIS — K912 Postsurgical malabsorption, not elsewhere classified: Secondary | ICD-10-CM | POA: Diagnosis not present

## 2017-03-20 DIAGNOSIS — N186 End stage renal disease: Secondary | ICD-10-CM | POA: Diagnosis not present

## 2017-03-20 DIAGNOSIS — N2581 Secondary hyperparathyroidism of renal origin: Secondary | ICD-10-CM | POA: Diagnosis not present

## 2017-03-25 DIAGNOSIS — N2581 Secondary hyperparathyroidism of renal origin: Secondary | ICD-10-CM | POA: Diagnosis not present

## 2017-03-25 DIAGNOSIS — D509 Iron deficiency anemia, unspecified: Secondary | ICD-10-CM | POA: Diagnosis not present

## 2017-03-25 DIAGNOSIS — D631 Anemia in chronic kidney disease: Secondary | ICD-10-CM | POA: Diagnosis not present

## 2017-03-25 DIAGNOSIS — N186 End stage renal disease: Secondary | ICD-10-CM | POA: Diagnosis not present

## 2017-03-30 DIAGNOSIS — N186 End stage renal disease: Secondary | ICD-10-CM | POA: Diagnosis not present

## 2017-03-30 DIAGNOSIS — I82409 Acute embolism and thrombosis of unspecified deep veins of unspecified lower extremity: Secondary | ICD-10-CM | POA: Diagnosis not present

## 2017-03-30 DIAGNOSIS — D509 Iron deficiency anemia, unspecified: Secondary | ICD-10-CM | POA: Diagnosis not present

## 2017-03-30 DIAGNOSIS — D631 Anemia in chronic kidney disease: Secondary | ICD-10-CM | POA: Diagnosis not present

## 2017-03-30 DIAGNOSIS — N2581 Secondary hyperparathyroidism of renal origin: Secondary | ICD-10-CM | POA: Diagnosis not present

## 2017-04-02 DIAGNOSIS — N2581 Secondary hyperparathyroidism of renal origin: Secondary | ICD-10-CM | POA: Diagnosis not present

## 2017-04-02 DIAGNOSIS — D631 Anemia in chronic kidney disease: Secondary | ICD-10-CM | POA: Diagnosis not present

## 2017-04-02 DIAGNOSIS — D509 Iron deficiency anemia, unspecified: Secondary | ICD-10-CM | POA: Diagnosis not present

## 2017-04-02 DIAGNOSIS — N186 End stage renal disease: Secondary | ICD-10-CM | POA: Diagnosis not present

## 2017-04-02 DIAGNOSIS — Z992 Dependence on renal dialysis: Secondary | ICD-10-CM | POA: Diagnosis not present

## 2017-05-04 DEATH — deceased
